# Patient Record
Sex: Male | Born: 1950
Health system: Southern US, Community
[De-identification: ages and names within clinical notes are randomized; demographics above are authoritative.]

## PROBLEM LIST (undated history)

## (undated) DIAGNOSIS — Z932 Ileostomy status: Secondary | ICD-10-CM

## (undated) DIAGNOSIS — Z789 Other specified health status: Secondary | ICD-10-CM

## (undated) DIAGNOSIS — G4733 Obstructive sleep apnea (adult) (pediatric): Secondary | ICD-10-CM

## (undated) DIAGNOSIS — K5190 Ulcerative colitis, unspecified, without complications: Secondary | ICD-10-CM

## (undated) DIAGNOSIS — I10 Essential (primary) hypertension: Secondary | ICD-10-CM

## (undated) DIAGNOSIS — N201 Calculus of ureter: Secondary | ICD-10-CM

## (undated) DIAGNOSIS — E0500 Thyrotoxicosis with diffuse goiter without thyrotoxic crisis or storm: Secondary | ICD-10-CM

## (undated) DIAGNOSIS — N200 Calculus of kidney: Secondary | ICD-10-CM

## (undated) DIAGNOSIS — E782 Mixed hyperlipidemia: Secondary | ICD-10-CM

## (undated) DIAGNOSIS — K219 Gastro-esophageal reflux disease without esophagitis: Secondary | ICD-10-CM

## (undated) DIAGNOSIS — E039 Hypothyroidism, unspecified: Secondary | ICD-10-CM

## (undated) DIAGNOSIS — Z8709 Personal history of other diseases of the respiratory system: Secondary | ICD-10-CM

## (undated) DIAGNOSIS — Z22321 Carrier or suspected carrier of Methicillin susceptible Staphylococcus aureus: Secondary | ICD-10-CM

## (undated) DIAGNOSIS — Z8051 Family history of malignant neoplasm of kidney: Secondary | ICD-10-CM

## (undated) DIAGNOSIS — F419 Anxiety disorder, unspecified: Secondary | ICD-10-CM

## (undated) DIAGNOSIS — Z9989 Dependence on other enabling machines and devices: Secondary | ICD-10-CM

## (undated) DIAGNOSIS — M1710 Unilateral primary osteoarthritis, unspecified knee: Secondary | ICD-10-CM

## (undated) DIAGNOSIS — M179 Osteoarthritis of knee, unspecified: Secondary | ICD-10-CM

## (undated) DIAGNOSIS — R931 Abnormal findings on diagnostic imaging of heart and coronary circulation: Secondary | ICD-10-CM

## (undated) DIAGNOSIS — Z8719 Personal history of other diseases of the digestive system: Secondary | ICD-10-CM

## (undated) DIAGNOSIS — Z85528 Personal history of other malignant neoplasm of kidney: Secondary | ICD-10-CM

## (undated) DIAGNOSIS — Z87442 Personal history of urinary calculi: Secondary | ICD-10-CM

## (undated) DIAGNOSIS — I639 Cerebral infarction, unspecified: Secondary | ICD-10-CM

## (undated) DIAGNOSIS — M1711 Unilateral primary osteoarthritis, right knee: Secondary | ICD-10-CM

## (undated) HISTORY — DX: Essential (primary) hypertension: I10

## (undated) HISTORY — DX: Mixed hyperlipidemia: E78.2

## (undated) HISTORY — DX: Other specified health status: Z78.9

## (undated) HISTORY — DX: Abnormal findings on diagnostic imaging of heart and coronary circulation: R93.1

## (undated) HISTORY — DX: Unilateral primary osteoarthritis, right knee: M17.11

## (undated) HISTORY — DX: Thyrotoxicosis with diffuse goiter without thyrotoxic crisis or storm: E05.00

## (undated) HISTORY — DX: Ulcerative colitis, unspecified, without complications: K51.90

## (undated) HISTORY — DX: Cerebral infarction, unspecified: I63.9

## (undated) HISTORY — DX: Family history of malignant neoplasm of kidney: Z80.51

---

## 1979-04-08 HISTORY — PX: OTHER SURGICAL HISTORY: SHX169

## 1979-04-08 HISTORY — PX: APPENDECTOMY: SHX54

## 1988-12-06 HISTORY — PX: TONSILLECTOMY: SUR1361

## 2001-04-07 HISTORY — PX: PARTIAL NEPHRECTOMY: SHX414

## 2001-11-14 ENCOUNTER — Emergency Department (HOSPITAL_COMMUNITY): Admission: EM | Admit: 2001-11-14 | Discharge: 2001-11-14 | Payer: Self-pay | Admitting: Emergency Medicine

## 2001-11-14 ENCOUNTER — Encounter: Payer: Self-pay | Admitting: Emergency Medicine

## 2001-11-16 ENCOUNTER — Emergency Department (HOSPITAL_COMMUNITY): Admission: EM | Admit: 2001-11-16 | Discharge: 2001-11-16 | Payer: Self-pay | Admitting: Emergency Medicine

## 2001-12-02 ENCOUNTER — Encounter: Payer: Self-pay | Admitting: Urology

## 2001-12-03 ENCOUNTER — Encounter: Payer: Self-pay | Admitting: Urology

## 2001-12-03 ENCOUNTER — Ambulatory Visit (HOSPITAL_COMMUNITY): Admission: RE | Admit: 2001-12-03 | Discharge: 2001-12-03 | Payer: Self-pay | Admitting: Urology

## 2001-12-10 ENCOUNTER — Inpatient Hospital Stay (HOSPITAL_COMMUNITY): Admission: RE | Admit: 2001-12-10 | Discharge: 2001-12-14 | Payer: Self-pay | Admitting: Urology

## 2001-12-10 ENCOUNTER — Encounter (INDEPENDENT_AMBULATORY_CARE_PROVIDER_SITE_OTHER): Payer: Self-pay | Admitting: Specialist

## 2002-04-18 ENCOUNTER — Ambulatory Visit (HOSPITAL_COMMUNITY): Admission: RE | Admit: 2002-04-18 | Discharge: 2002-04-18 | Payer: Self-pay | Admitting: Urology

## 2002-04-18 ENCOUNTER — Encounter: Payer: Self-pay | Admitting: Urology

## 2003-03-01 ENCOUNTER — Ambulatory Visit (HOSPITAL_COMMUNITY): Admission: EM | Admit: 2003-03-01 | Discharge: 2003-03-01 | Payer: Self-pay | Admitting: Emergency Medicine

## 2003-03-08 ENCOUNTER — Inpatient Hospital Stay (HOSPITAL_COMMUNITY): Admission: EM | Admit: 2003-03-08 | Discharge: 2003-03-11 | Payer: Self-pay | Admitting: *Deleted

## 2003-05-08 ENCOUNTER — Ambulatory Visit (HOSPITAL_COMMUNITY): Admission: RE | Admit: 2003-05-08 | Discharge: 2003-05-08 | Payer: Self-pay | Admitting: Urology

## 2004-10-04 ENCOUNTER — Ambulatory Visit (HOSPITAL_COMMUNITY): Admission: RE | Admit: 2004-10-04 | Discharge: 2004-10-04 | Payer: Self-pay | Admitting: Orthopedic Surgery

## 2004-10-04 ENCOUNTER — Ambulatory Visit (HOSPITAL_BASED_OUTPATIENT_CLINIC_OR_DEPARTMENT_OTHER): Admission: RE | Admit: 2004-10-04 | Discharge: 2004-10-04 | Payer: Self-pay | Admitting: Orthopedic Surgery

## 2005-01-01 ENCOUNTER — Ambulatory Visit (HOSPITAL_COMMUNITY): Admission: RE | Admit: 2005-01-01 | Discharge: 2005-01-01 | Payer: Self-pay | Admitting: Urology

## 2006-01-20 ENCOUNTER — Ambulatory Visit (HOSPITAL_COMMUNITY): Admission: RE | Admit: 2006-01-20 | Discharge: 2006-01-20 | Payer: Self-pay | Admitting: Urology

## 2006-08-28 ENCOUNTER — Ambulatory Visit (HOSPITAL_COMMUNITY): Admission: RE | Admit: 2006-08-28 | Discharge: 2006-08-28 | Payer: Self-pay | Admitting: Urology

## 2006-09-23 ENCOUNTER — Ambulatory Visit (HOSPITAL_COMMUNITY): Admission: RE | Admit: 2006-09-23 | Discharge: 2006-09-23 | Payer: Self-pay | Admitting: Urology

## 2006-09-23 HISTORY — PX: OTHER SURGICAL HISTORY: SHX169

## 2006-10-05 ENCOUNTER — Ambulatory Visit (HOSPITAL_COMMUNITY): Admission: RE | Admit: 2006-10-05 | Discharge: 2006-10-05 | Payer: Self-pay | Admitting: Urology

## 2007-03-09 ENCOUNTER — Ambulatory Visit (HOSPITAL_COMMUNITY): Admission: RE | Admit: 2007-03-09 | Discharge: 2007-03-09 | Payer: Self-pay | Admitting: Urology

## 2008-02-01 ENCOUNTER — Emergency Department (HOSPITAL_COMMUNITY): Admission: EM | Admit: 2008-02-01 | Discharge: 2008-02-02 | Payer: Self-pay | Admitting: Emergency Medicine

## 2008-02-03 ENCOUNTER — Emergency Department (HOSPITAL_BASED_OUTPATIENT_CLINIC_OR_DEPARTMENT_OTHER): Admission: EM | Admit: 2008-02-03 | Discharge: 2008-02-03 | Payer: Self-pay | Admitting: Emergency Medicine

## 2008-02-22 ENCOUNTER — Encounter: Admission: RE | Admit: 2008-02-22 | Discharge: 2008-02-22 | Payer: Self-pay | Admitting: Gastroenterology

## 2008-07-30 ENCOUNTER — Emergency Department (HOSPITAL_BASED_OUTPATIENT_CLINIC_OR_DEPARTMENT_OTHER): Admission: EM | Admit: 2008-07-30 | Discharge: 2008-07-30 | Payer: Self-pay | Admitting: Emergency Medicine

## 2009-02-06 ENCOUNTER — Ambulatory Visit (HOSPITAL_COMMUNITY): Admission: RE | Admit: 2009-02-06 | Discharge: 2009-02-06 | Payer: Self-pay | Admitting: Urology

## 2009-02-25 ENCOUNTER — Ambulatory Visit: Payer: Self-pay | Admitting: Diagnostic Radiology

## 2009-02-25 ENCOUNTER — Encounter: Payer: Self-pay | Admitting: Emergency Medicine

## 2009-02-26 ENCOUNTER — Inpatient Hospital Stay (HOSPITAL_COMMUNITY): Admission: EM | Admit: 2009-02-26 | Discharge: 2009-02-26 | Payer: Self-pay | Admitting: Internal Medicine

## 2009-04-12 ENCOUNTER — Encounter: Payer: Self-pay | Admitting: Emergency Medicine

## 2009-04-12 ENCOUNTER — Ambulatory Visit: Payer: Self-pay | Admitting: Diagnostic Radiology

## 2009-04-12 ENCOUNTER — Inpatient Hospital Stay (HOSPITAL_COMMUNITY): Admission: AD | Admit: 2009-04-12 | Discharge: 2009-04-14 | Payer: Self-pay | Admitting: Internal Medicine

## 2010-02-14 ENCOUNTER — Ambulatory Visit (HOSPITAL_COMMUNITY): Admission: RE | Admit: 2010-02-14 | Discharge: 2010-02-14 | Payer: Self-pay | Admitting: Urology

## 2010-04-28 ENCOUNTER — Encounter: Payer: Self-pay | Admitting: Family Medicine

## 2010-06-23 LAB — CBC
HCT: 41.4 % (ref 39.0–52.0)
HCT: 53.2 % — ABNORMAL HIGH (ref 39.0–52.0)
Hemoglobin: 14.5 g/dL (ref 13.0–17.0)
Hemoglobin: 18.4 g/dL — ABNORMAL HIGH (ref 13.0–17.0)
RBC: 4.55 MIL/uL (ref 4.22–5.81)
RBC: 5.86 MIL/uL — ABNORMAL HIGH (ref 4.22–5.81)
RDW: 12.1 % (ref 11.5–15.5)
WBC: 6 10*3/uL (ref 4.0–10.5)

## 2010-06-23 LAB — URINE MICROSCOPIC-ADD ON

## 2010-06-23 LAB — COMPREHENSIVE METABOLIC PANEL
ALT: 25 U/L (ref 0–53)
ALT: 36 U/L (ref 0–53)
Alkaline Phosphatase: 62 U/L (ref 39–117)
Alkaline Phosphatase: 112 U/L (ref 39–117)
BUN: 19 mg/dL (ref 6–23)
BUN: 21 mg/dL (ref 6–23)
CO2: 16 mEq/L — ABNORMAL LOW (ref 19–32)
Chloride: 105 mEq/L (ref 96–112)
GFR calc non Af Amer: 31 mL/min — ABNORMAL LOW (ref >60)
Glucose, Bld: 128 mg/dL — ABNORMAL HIGH (ref 70–99)
Glucose, Bld: 135 mg/dL — ABNORMAL HIGH (ref 70–99)
Potassium: 3.3 mEq/L — ABNORMAL LOW (ref 3.5–5.1)
Potassium: 4.6 mEq/L (ref 3.5–5.1)
Sodium: 139 mEq/L (ref 135–145)
Sodium: 141 mEq/L (ref 135–145)
Total Bilirubin: 0.9 mg/dL (ref 0.3–1.2)
Total Bilirubin: 1.6 mg/dL — ABNORMAL HIGH (ref 0.3–1.2)

## 2010-06-23 LAB — URINALYSIS, ROUTINE W REFLEX MICROSCOPIC
Glucose, UA: NEGATIVE mg/dL
Ketones, ur: 15 mg/dL — AB
Leukocytes, UA: NEGATIVE
Nitrite: NEGATIVE
Protein, ur: 100 mg/dL — AB
pH: 5.5 (ref 5.0–8.0)

## 2010-06-23 LAB — DIFFERENTIAL
Basophils Absolute: 0.1 10*3/uL (ref 0.0–0.1)
Basophils Relative: 1 % (ref 0–1)
Eosinophils Absolute: 0 10*3/uL (ref 0.0–0.7)
Neutro Abs: 5.5 10*3/uL (ref 1.7–7.7)
Neutrophils Relative %: 63 % (ref 43–77)

## 2010-06-23 LAB — LIPASE, BLOOD
Lipase: 23 U/L (ref 11–59)
Lipase: 121 U/L (ref 23–300)

## 2010-07-10 LAB — COMPREHENSIVE METABOLIC PANEL
ALT: 20 U/L (ref 0–53)
AST: 23 U/L (ref 0–37)
Albumin: 3.8 g/dL (ref 3.5–5.2)
Chloride: 103 mEq/L (ref 96–112)
Creatinine, Ser: 1.29 mg/dL (ref 0.4–1.5)
GFR calc Af Amer: >60 mL/min (ref >60)
Sodium: 134 mEq/L — ABNORMAL LOW (ref 135–145)
Total Bilirubin: 1.3 mg/dL — ABNORMAL HIGH (ref 0.3–1.2)

## 2010-07-10 LAB — BASIC METABOLIC PANEL
BUN: 30 mg/dL — ABNORMAL HIGH (ref 6–23)
CO2: 19 mEq/L (ref 19–32)
Chloride: 98 mEq/L (ref 96–112)
GFR calc Af Amer: 38 mL/min — ABNORMAL LOW (ref >60)
Potassium: 4 mEq/L (ref 3.5–5.1)

## 2010-07-10 LAB — CBC
HCT: 51.5 % (ref 39.0–52.0)
MCV: 91.5 fL (ref 78.0–100.0)
MCV: 89.9 fL (ref 78.0–100.0)
Platelets: 278 10*3/uL (ref 150–400)
RBC: 4.74 MIL/uL (ref 4.22–5.81)
RBC: 5.72 MIL/uL (ref 4.22–5.81)
WBC: 4.7 10*3/uL (ref 4.0–10.5)
WBC: 7.1 10*3/uL (ref 4.0–10.5)

## 2010-07-10 LAB — TSH: TSH: 3.188 u[IU]/mL (ref 0.350–4.500)

## 2010-07-10 LAB — URINALYSIS, ROUTINE W REFLEX MICROSCOPIC
Bilirubin Urine: NEGATIVE
Glucose, UA: NEGATIVE mg/dL
Hgb urine dipstick: NEGATIVE
Ketones, ur: NEGATIVE mg/dL
Protein, ur: NEGATIVE mg/dL

## 2010-07-10 LAB — DIFFERENTIAL
Eosinophils Absolute: 0.1 10*3/uL (ref 0.0–0.7)
Eosinophils Relative: 1 % (ref 0–5)
Lymphs Abs: 2 10*3/uL (ref 0.7–4.0)
Monocytes Relative: 22 % — ABNORMAL HIGH (ref 3–12)

## 2010-07-17 LAB — COMPREHENSIVE METABOLIC PANEL
Albumin: 5.6 g/dL — ABNORMAL HIGH (ref 3.5–5.2)
BUN: 27 mg/dL — ABNORMAL HIGH (ref 6–23)
Chloride: 99 mEq/L (ref 96–112)
Creatinine, Ser: 1.7 mg/dL — ABNORMAL HIGH (ref 0.4–1.5)
Total Bilirubin: 2 mg/dL — ABNORMAL HIGH (ref 0.3–1.2)
Total Protein: 10.1 g/dL — ABNORMAL HIGH (ref 6.0–8.3)

## 2010-07-17 LAB — URINALYSIS, ROUTINE W REFLEX MICROSCOPIC
Hgb urine dipstick: NEGATIVE
Nitrite: NEGATIVE
Specific Gravity, Urine: 1.027 (ref 1.005–1.030)
Urobilinogen, UA: 0.2 mg/dL (ref 0.0–1.0)

## 2010-07-17 LAB — CBC
HCT: 51.2 % (ref 39.0–52.0)
MCHC: 34.3 g/dL (ref 30.0–36.0)
MCV: 89.8 fL (ref 78.0–100.0)
Platelets: 319 10*3/uL (ref 150–400)
RDW: 12.2 % (ref 11.5–15.5)

## 2010-08-20 NOTE — Op Note (Signed)
[name], David [name]              ACCOUNT NO.:  [id number]   MEDICAL RECORD NO.:  [id number]          PATIENT TYPE:  AMB   LOCATION:  DAY                          FACILITY:  [facility]   PHYSICIAN:  Corky Downs, M.D.DATE OF BIRTH:  04-25-1950   DATE OF PROCEDURE:  09/23/2006  DATE OF DISCHARGE:                               OPERATIVE REPORT   PREOPERATIVE DIAGNOSIS:  Right mid ureteral stone 6 mm.   POSTOPERATIVE DIAGNOSIS:  Right mid ureteral stone 6 mm.   OPERATION:  Cystoscopy, retrograde pyelogram, insertion of right  ureteral stent.   ANESTHESIA:  General.   SURGEON:  Corky Downs, M.D.   BRIEF HISTORY:  This 60 year old patient admitted with an obstructing  stone that presented earlier today.  He has an ileostomy after having a  total colectomy for Crohn disease many years ago.  He has had recurrent  stones.  He has had to have ureteroscopy by Dr. Rosana Hoes in the past, was  seen in the office on 08/28/2006, had a 6 mm right mid renal stone on CT  scan and has had a partial left nephrectomy for renal cancer in the  past.  The KUB shows the stone looks like it is in the junction of the  middle to lower third of the ureter on KUB and he enters now for cysto,  stent insertion to relieve his pain and nausea and vomiting.   The patient was placed on the operating table in dorsal lithotomy  position after satisfactory induction of general anesthesia.  He had  been given IV fluids and pain medicine until we could get him scheduled  to come to the operating room.  He was given some IV Cipro and the #21  panendoscope was inserted.  No anterior strictures seen.  Post urethra  was nonobstructing.  The bladder was entered.  Bladder was smooth not  trabeculated.  Had no bladder mucosal lesions.  Both orifices looked  normal.  Using a 6 open-ended ureteral catheter, an occlusive retrograde  was performed on the right side.   The retrograde demonstrated a stone in the mid  ureter that was flushed  more proximal with the dye but was clearly seen.  There was mild to  moderate hydronephrosis.  Under fluoroscopy a guidewire was then passed  beyond the stone up to the level of the kidney as the open-ended  catheter was then removed. Over the guidewire a 4.5 x 24 cm length  double-J ureteral stent was fashioned with a nice coil in the renal  pelvis and one in the bladder when the guidewire was then removed.  The  stone was still opposite the transverse process of L3 on the right in  good position for lithotripsy.  We will set this up for most likely June  30 and they have a family vacation coming up next week.  Sent to  recovery room in good condition to be later discharged as an outpatient  with detailed written instructions.      Corky Downs, M.D.  Electronically Signed    HMK/MEDQ  D:  09/23/2006  T:  09/24/2006  Job:  [id number]

## 2010-08-23 NOTE — Discharge Summary (Signed)
NAME:  David [name]                        ACCOUNT NO.:  [id number]   MEDICAL RECORD NO.:  [id number]                   PATIENT TYPE:  INP   LOCATION:  8270                                 FACILITY:  [facility]   PHYSICIAN:  Jerelene Redden, MD                   DATE OF BIRTH:  02-Dec-1950   DATE OF ADMISSION:  03/08/2003  DATE OF DISCHARGE:  03/11/2003                                 DISCHARGE SUMMARY   David [name] is a 60 year old man who reports that he was discharged from  the hospital on March 01, 2003, after removal of a kidney stone.  Saturday night after eating, he felt uneasy, as if his stomach was full, and  he had a difficult time sleeping.  The next day he began experiencing  vomiting.  On Monday, the vomiting became more severe but he chose to go to  [place] with his family on a planned vacation.  Vomiting persisted and he was  seen in the emergency room in [place] on Tuesday.  He was given two liters of  fluid IV but became increasingly uneasy with his treatment, and arranged for  transportation back to [place].  The last episode of vomiting was at 6:00  p.m. the day of admission, which was March 08, 2003.   On physical exam at the time of admission he was a somewhat nauseated  cooperative man who was not experiencing any pain.  HEENT exam was within  normal limits.  The chest was clear.  Examination of the back revealed no  CVA or point tenderness.  Cardiovascular exam revealed normal S1 and S2  without rubs, murmurs or gallops.  The abdomen was remarkable for good bowel  sounds which were not high-pitched, there was no guarding, no rebound, no  masses.  The ileostomy bag contained some brown fluid.  The patient does not  have a rectum as he is status post colectomy.  Neurologic testing and his  extremities is normal.   Relevant laboratory studies included sodium 129, potassium 3.6, creatinine  was 1.8, BUN was 34.  The AST was 58, ALT was 88.  Subsequently these  values  were followed with IV hydration and the sodium came up to 137, creatinine  went down to 1.2, and liver functions normalized.  The white count was  7,800, hemoglobin was 17.3.  X-ray studies showed moderate dilatation of the  small bowel with air fluid levels consistent with a small bowel obstruction.   On admission the patient placed on IV of normal saline, he was given two  liters initially, and then was given IV at the rate of 250 mL per hour with  10 mEq of KCl per liter.  Nausea medications including Phenergan and Zofran  were ordered.  The patient was seen in consultation by Dr. Cristina Gong of the GI  service.  A small bowel follow-through was done.  Review of this study  showed that there was good movement of barium through the small bowel with  no evidence of mechanical obstruction.  By March 11, 2003, the patient was  feeling entirely well, he had a good appetite, he said that his ileostomy  function was almost back to normal.  He very strongly wished to be  discharged from the hospital.  His diet was advanced to a bland soft diet  and plans were made to discharge him at this time.  On March 11, 2003, the  patient was discharged.   DISCHARGE DIAGNOSES:  1. Transient small bowel obstruction resolved with conservative therapy.  2. Vomiting and dehydration secondary to #1.  3. History of ulcerative colitis status post colectomy.  4. History of kidney stones.  5. History of renal cell cancer status post partial nephrectomy.  6. Hypothyroidism.   DISCHARGE MEDICATIONS:  1. Synthroid 175 mcg daily.  2. Paxil 20 mg daily.  3. The patient will also be given a five-day course of oral Cipro to     continue the Cipro that was started in the hospital with the possible     consideration of having acquired a empiric pathogen in [place].  4. He will also use Imodium on a p.r.n. basis.   He was encouraged to follow up with the [facility] and normally sees  Dr. Amedeo Plenty.  He was  also urged to follow up with [facility] on a p.r.n. basis.  Condition at the time of discharge was good.                                               Jerelene Redden, MD    SY/MEDQ  D:  03/11/2003  T:  03/11/2003  Job:  [id number]   cc:   Ronald Lobo, M.D.  [address] [place]  [place], [place] [address]  Fax: [phone]   [place] Parks Neptune, M.D.  [place], [address] [place]   [address]  Fax: [phone]

## 2010-08-23 NOTE — H&P (Signed)
NAME:  David [name], David [name]                        ACCOUNT NO.:  [id number]   MEDICAL RECORD NO.:  [id number]                   PATIENT TYPE:  OUT   LOCATION:  XRAY                                 FACILITY:  [facility]   PHYSICIAN:  Duane Lope. Parks Neptune, M.D.           DATE OF BIRTH:  May 07, 1950   DATE OF ADMISSION:  12/10/2001  DATE OF DISCHARGE:  12/03/2001                                HISTORY & PHYSICAL   CHIEF COMPLAINT:  I have cancer.   HISTORY OF PRESENT ILLNESS:  The patient is a very nice 60 year old white  male who presented after a CT scan for kidney stones revealed a 1.7 cm left  renal mass.  He subsequently passed the kidney stone and on follow-up CT  scan did not have any more ureteral calculi, however, on selected CT scan of  the kidney with and without contrast it was felt to have some advancement  and was suspicious for a hypovascular left renal cell carcinoma.  There is  also some question of bone activity so a bone scan was essentially negative  and also on CT of the chest there was a 5 mm pleural based nodule on the  right side upper lobe which was very suspicious for a granuloma and was felt  to be most likely benign.  It was not in an area that could be biopsied and  the plan was to follow up because of the very low likelihood of it being a  metastatic deposit.  After understanding risks, benefits, and alternatives  he has elected to proceed with flank exploration, partial nephrectomy,  possible radical nephrectomy.  We discussed laparoscopic techniques and  also, of note, he has had a prior colectomy.   ALLERGIES:  No known drug allergies.   MEDICATIONS:  Synthroid and Paxil.   PAST MEDICAL HISTORY:  He had chronic ulcerative colitis that has been fully  treated now.  Had childhood asthma.  Otherwise, essentially negative.   PAST SURGICAL HISTORY:  He had an appendectomy years ago and a colectomy and  ileostomy in 1981.   SOCIAL HISTORY:  Negative smoker.   Negative drinker.   FAMILY HISTORY:  Nonsignificant.   REVIEW OF SYMPTOMS:  He has no shortness of breath, dyspnea on exertion,  chest pain, or GI complaints and his colostomy is functional.   PHYSICAL EXAMINATION:  VITAL SIGNS:  Temperature 97.5 degrees Fahrenheit,  pulse 67, respirations 12-15, blood pressure 110/70.  GENERAL:  Well-nourished, well-developed, well groomed.  HEENT:  PERRLA.  Ear, nose, and throat clear.  NECK:  Without mass or thyromegaly.  CHEST:  Clear anterior and posterior without rales or rhonchi.  ABDOMEN:  Soft, nontender without masses, organomegaly, or hernias.  Wounds  are well healed.  EXTREMITIES:  Normal.  NEUROLOGIC:  Intact.  SKIN:  Normal.  GENITALIA:  Penis, meatus, scrotum, testicle, adnexa, anus, and perineum are  normal.   IMPRESSION:  Left renal mass.  PLAN:  Left flank exploration with possible partial nephrectomy and possible  radical nephrectomy.                                               [place] Parks Neptune, M.D.    RLD/MEDQ  D:  12/10/2001  T:  12/10/2001  Job:  [id number]

## 2010-08-23 NOTE — Consult Note (Signed)
NAME:  David [name], David [name]                        ACCOUNT NO.:  [id number]   MEDICAL RECORD NO.:  [id number]                   PATIENT TYPE:  INP   LOCATION:  7124                                 [facility]   PHYSICIAN:  Jeryl Columbia, M.D.                 DATE OF BIRTH:  Apr 07, 1951   DATE OF CONSULTATION:  03/09/2003  DATE OF DISCHARGE:                                   CONSULTATION   HISTORY:  The patient with a five day history of nausea, vomiting, and  change in ileostomy output with some decreased output.  The patient is seen  at the request of Ashby Dawes. Polite, M.D. for the above symptoms.  He has had  partial bowel obstructions in the past, but usually can tell when they  resolve.  This one has lasted longer than most.  He has had these off and on  ever since his ileostomy.  Does say that eating popcorn and carrots or  certain foods like that do tend to bring them on.  He was in [place] when  this occurred, but had no sick contacts.  Did go to the [place] ER but no  laboratory work was done.  Was started on Cipro and Flagyl.  Only took  Cipro, but was also given some Imodium and did take some of that which may  have contributed to worsening his problem.  He has not seen any blood and  feels better this morning and says his ileostomy output has increased.   PAST MEDICAL HISTORY:  1. Thyroid problems.  2. Ileostomy secondary to ulcerative colitis 25 years ago.  3. Had a recent cystoscopy for kidney stones.  4. Left partial nephrectomy for kidney cancer.   FAMILY HISTORY:  Negative for any obvious GI problems.   SOCIAL HISTORY:  Denies alcohol or tobacco use.   MEDICATIONS:  1. Levoxyl.  2. Paxil.  3. Cipro.  4. Imodium.   REVIEW OF SYSTEMS:  Negative except above.   PHYSICAL EXAMINATION:  GENERAL:  No acute distress.  No abdominal pain.  Decreased, but present bowel sounds.  VITAL SIGNS:  Stable.  Afebrile.   LABORATORIES:  He did have an increased BUN and  creatinine, better with  hydration, slight increase in liver tests.  Last night KUB looks like small  bowel obstruction, probably a little less air today, but no colon available  to see if there is any colon gas.  Definitely not a __________.   ASSESSMENT:  Probable partial small bowel obstruction, although patient  feeling better.  Does have history of kidney stones, kidney cancer,  ulcerative colitis with ileostomy.   PLAN:  Because of his lack of colon interpreting the x-rays can be  difficult.  It is difficult to know what his baseline x-ray is.  He denies  any obvious abdominal distention.  I would go ahead and allow clear liquids  and will observe  and follow with you.  Consider an upper GI small bowel  follow through.  If we thought he was still obstructed use Gastrografin.  If  we thought he was even better might even use barium to get a better study.  However, if he was worse, might consider a Gastrografin via his ileostomy to  evaluate from below.  Might even consider surgical consult.  Will follow  with you.                                               Jeryl Columbia, M.D.    MEM/MEDQ  D:  03/09/2003  T:  03/09/2003  Job:  [id number]   cc:   [place] Amedeo Plenty, M.D.  [address], [place]  [place] [address]  Fax: [place] Parks Neptune, M.D.  [place]. [address], [address] [place]  [place] [address]  Fax: [phone]

## 2010-09-11 ENCOUNTER — Emergency Department (HOSPITAL_COMMUNITY): Payer: BC Managed Care – PPO

## 2010-09-11 ENCOUNTER — Emergency Department (HOSPITAL_COMMUNITY)
Admission: EM | Admit: 2010-09-11 | Discharge: 2010-09-12 | Disposition: A | Discharge status: Home / Self Care | Payer: BC Managed Care – PPO | Attending: Emergency Medicine | Admitting: Emergency Medicine

## 2010-09-11 DIAGNOSIS — R300 Dysuria: Secondary | ICD-10-CM | POA: Insufficient documentation

## 2010-09-11 DIAGNOSIS — E039 Hypothyroidism, unspecified: Secondary | ICD-10-CM | POA: Insufficient documentation

## 2010-09-11 DIAGNOSIS — Z87442 Personal history of urinary calculi: Secondary | ICD-10-CM | POA: Insufficient documentation

## 2010-09-11 DIAGNOSIS — Z85528 Personal history of other malignant neoplasm of kidney: Secondary | ICD-10-CM | POA: Insufficient documentation

## 2010-09-11 DIAGNOSIS — F411 Generalized anxiety disorder: Secondary | ICD-10-CM | POA: Insufficient documentation

## 2010-09-11 DIAGNOSIS — N23 Unspecified renal colic: Secondary | ICD-10-CM | POA: Insufficient documentation

## 2010-09-11 DIAGNOSIS — R109 Unspecified abdominal pain: Secondary | ICD-10-CM | POA: Insufficient documentation

## 2010-09-11 LAB — URINALYSIS, ROUTINE W REFLEX MICROSCOPIC
Glucose, UA: NEGATIVE mg/dL
Nitrite: NEGATIVE
Protein, ur: NEGATIVE mg/dL
Urobilinogen, UA: 0.2 mg/dL (ref 0.0–1.0)

## 2010-09-11 LAB — POCT I-STAT, CHEM 8
HCT: 44 % (ref 39.0–52.0)
Hemoglobin: 15 g/dL (ref 13.0–17.0)
Sodium: 142 mEq/L (ref 135–145)
TCO2: 26 mmol/L (ref 0–100)

## 2011-01-06 LAB — CBC
HCT: 47.4
Platelets: 298
WBC: 13.8 — ABNORMAL HIGH

## 2011-01-06 LAB — DIFFERENTIAL
Eosinophils Relative: 1
Lymphocytes Relative: 9 — ABNORMAL LOW
Lymphs Abs: 1.2
Neutro Abs: 11.3 — ABNORMAL HIGH

## 2011-01-07 LAB — URINALYSIS, ROUTINE W REFLEX MICROSCOPIC
Glucose, UA: NEGATIVE
Ketones, ur: 15 — AB
Nitrite: NEGATIVE
Specific Gravity, Urine: 1.029
pH: 5

## 2011-01-07 LAB — CBC
MCHC: 34.8
MCV: 88.5
Platelets: 305
RDW: 11.8

## 2011-01-07 LAB — DIFFERENTIAL
Eosinophils Absolute: 0.1
Eosinophils Relative: 1
Lymphocytes Relative: 28
Lymphs Abs: 2
Monocytes Relative: 17 — ABNORMAL HIGH

## 2011-01-07 LAB — URINE MICROSCOPIC-ADD ON

## 2011-01-07 LAB — COMPREHENSIVE METABOLIC PANEL
ALT: 26
AST: 31
Calcium: 10.3
Creatinine, Ser: 1.8 — ABNORMAL HIGH
GFR calc Af Amer: 48 — ABNORMAL LOW
GFR calc non Af Amer: 39 — ABNORMAL LOW
Sodium: 141
Total Protein: 8.5 — ABNORMAL HIGH

## 2011-01-09 IMAGING — CR DG CHEST 2V
2 series · 2 of 2 positions shown · non-contrast
Comparison: 03/09/2007

CLINICAL DATA: Renal cell cancer

CHEST - 2 VIEW

[w chest pa]
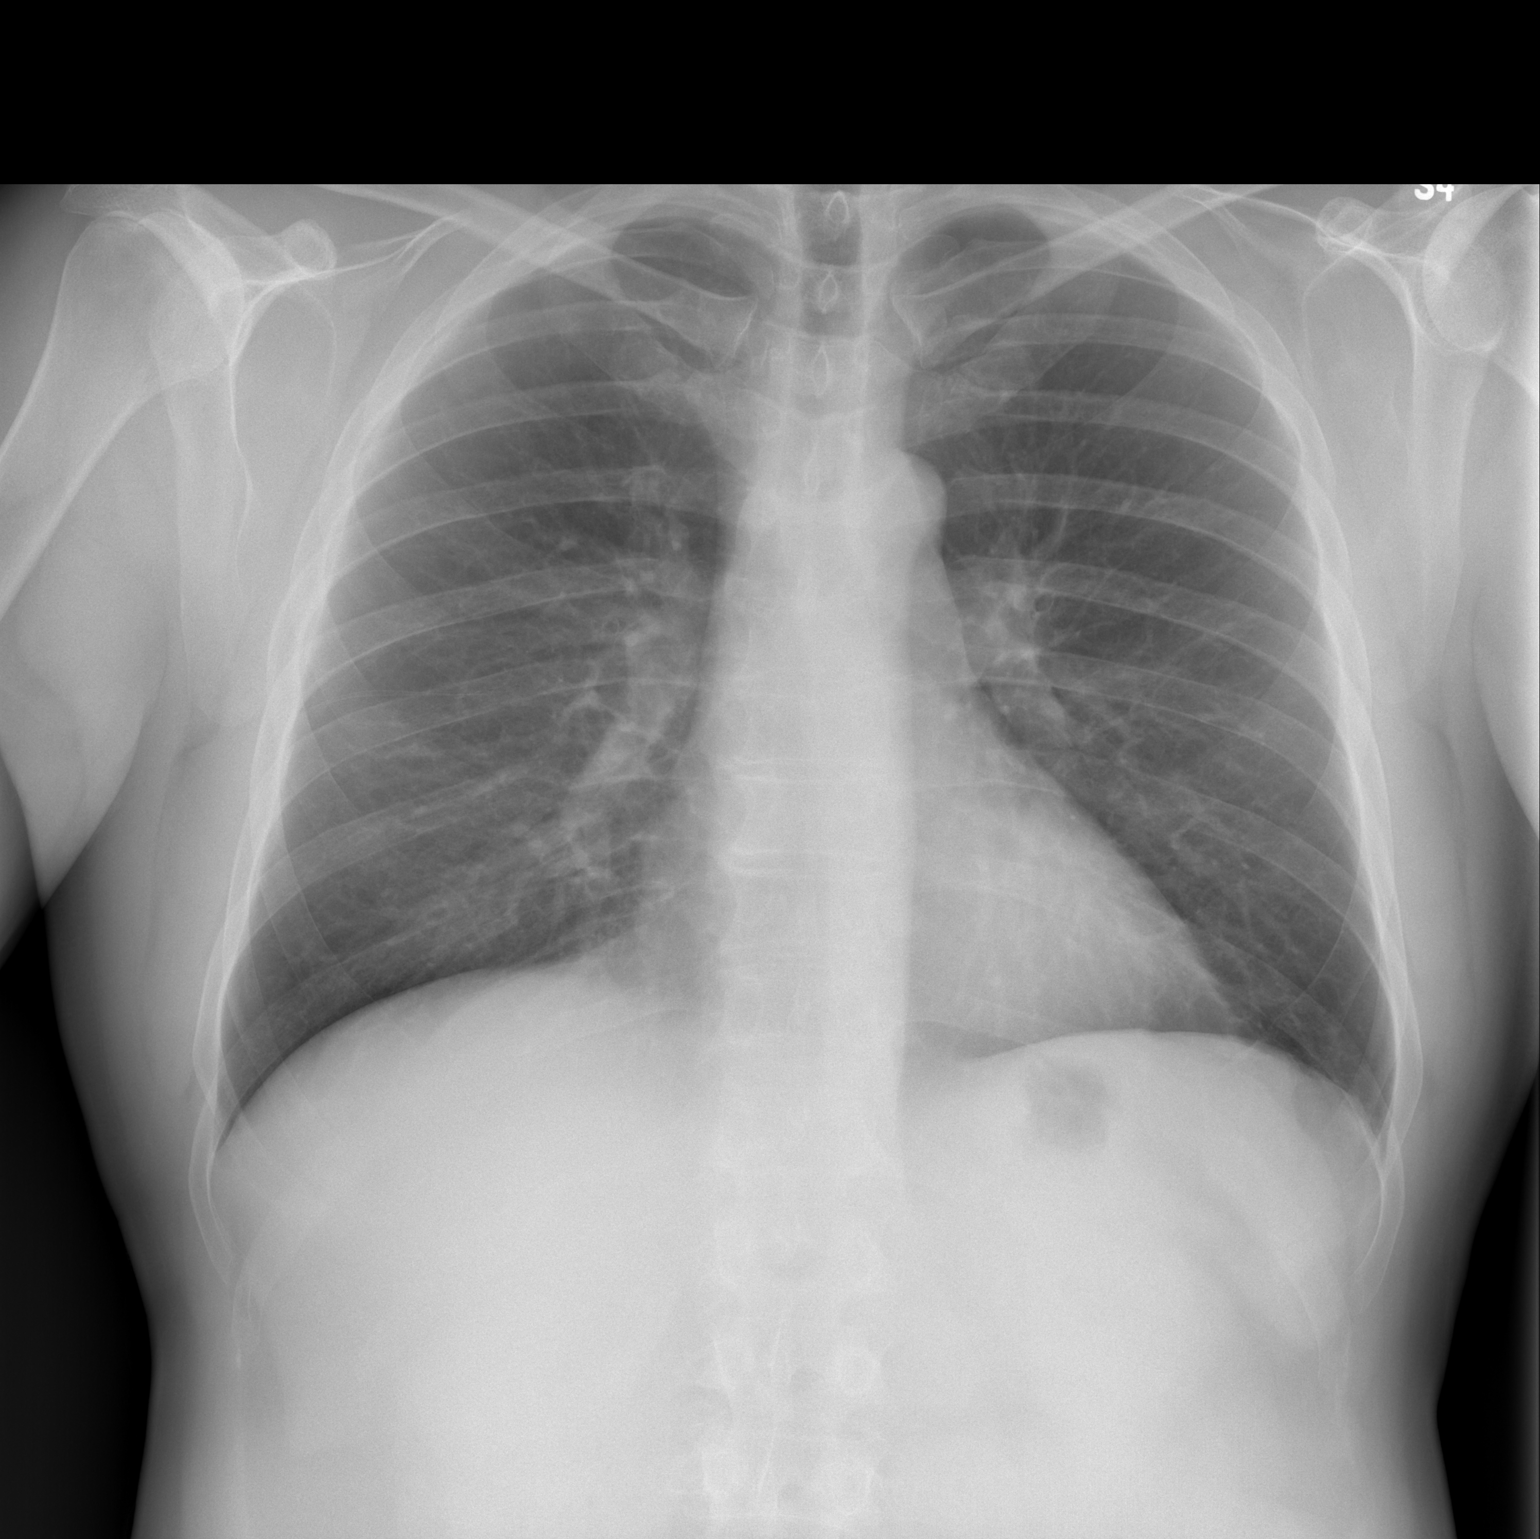

[w chest lat]
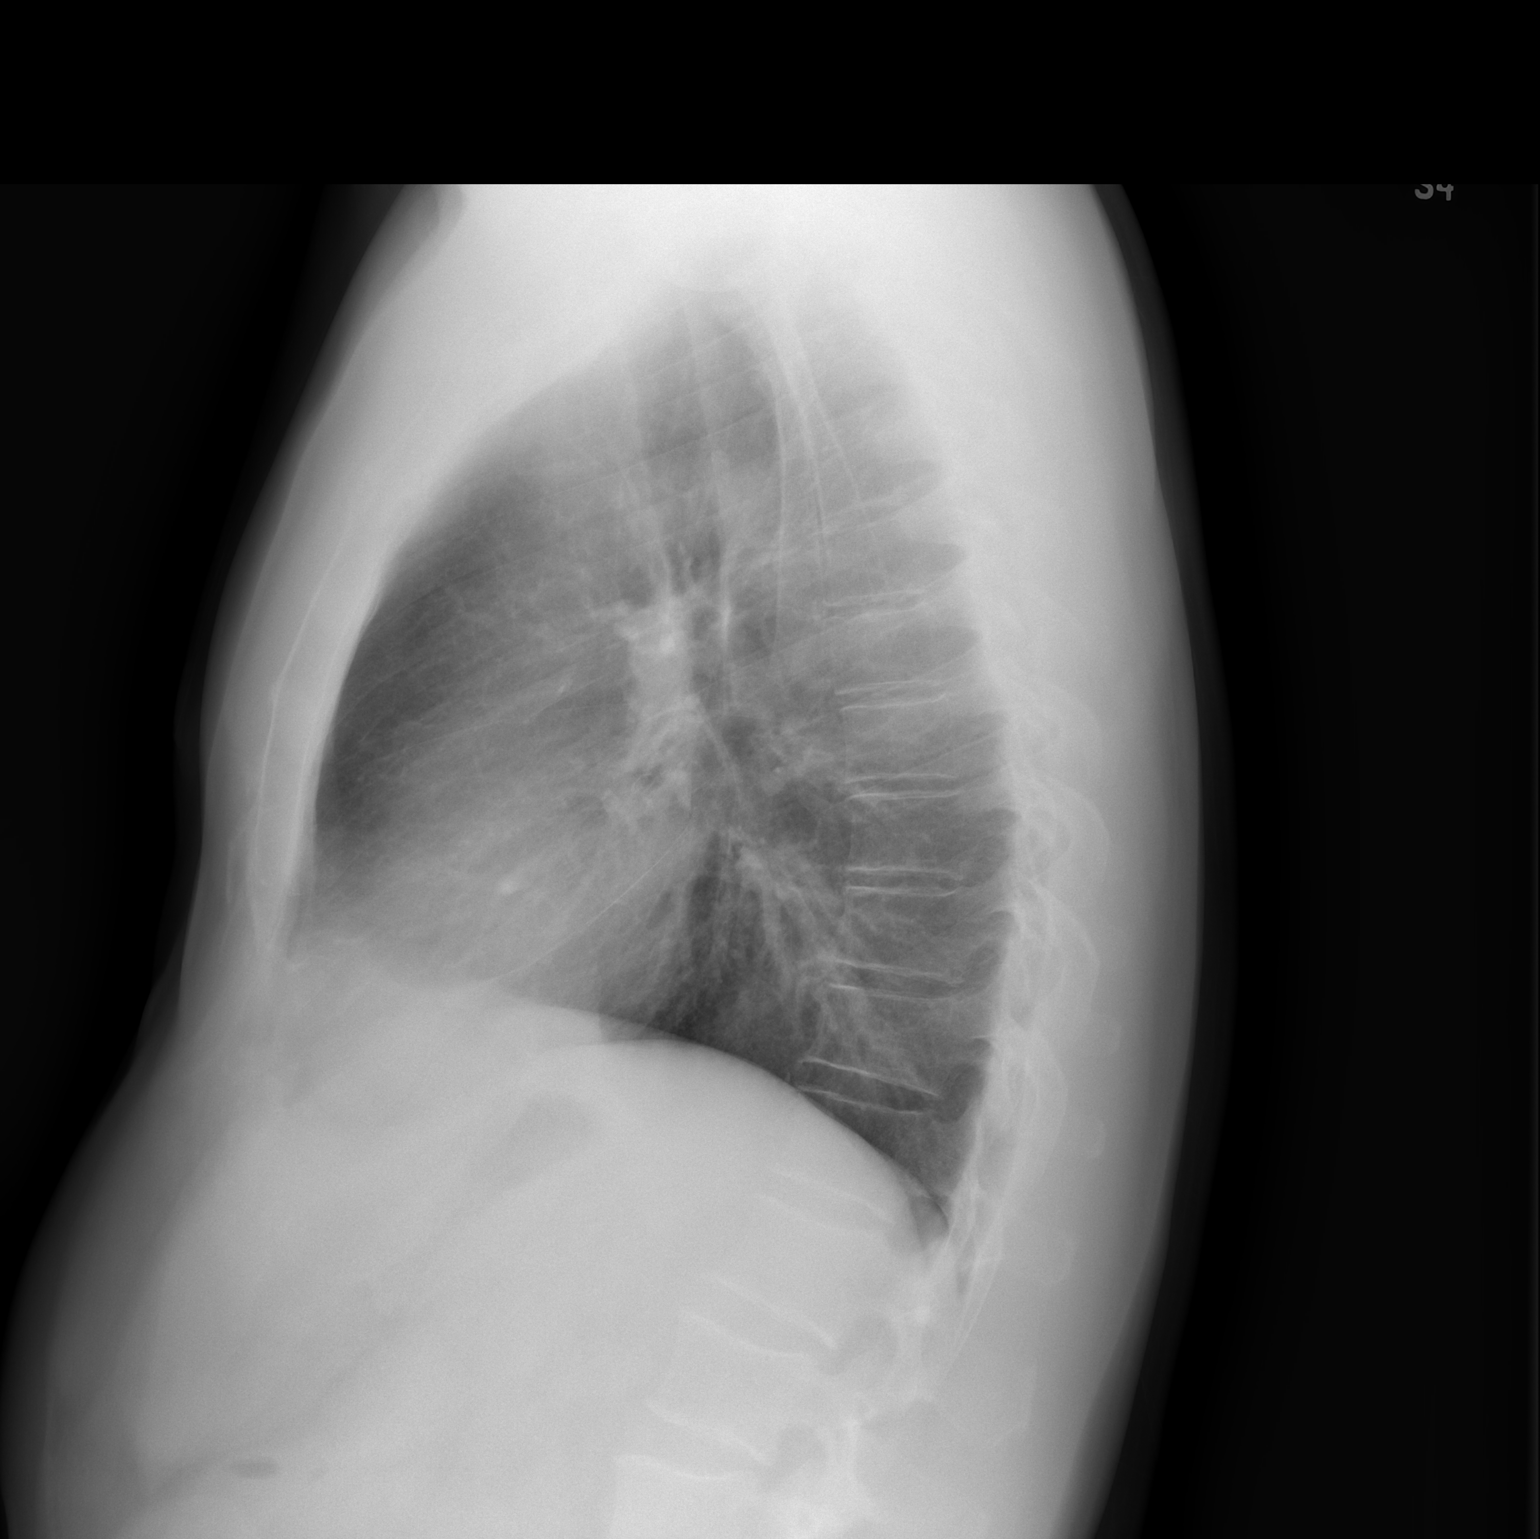

[2 of 2 positions shown; findings below may reference images not displayed]

FINDINGS: The heart is normal in size.  Lungs are clear.  No
pneumothorax.  No pleural effusion.
IMPRESSION: No active cardiopulmonary disease.

## 2011-01-22 LAB — URINALYSIS, ROUTINE W REFLEX MICROSCOPIC
Bilirubin Urine: NEGATIVE
Glucose, UA: NEGATIVE
Ketones, ur: NEGATIVE
Protein, ur: 100 — AB
Urobilinogen, UA: 0.2

## 2011-01-22 LAB — BASIC METABOLIC PANEL
BUN: 21
CO2: 21
Calcium: 9.8
Chloride: 105
Creatinine, Ser: 1.64 — ABNORMAL HIGH
GFR calc Af Amer: 53 — ABNORMAL LOW

## 2011-01-22 LAB — CBC
HCT: 40.7
HCT: 48.5
Hemoglobin: 14.2
MCHC: 34.6
MCV: 87.6
MCV: 87.7
Platelets: 364
RBC: 4.65
RBC: 5.53
WBC: 16.6 — ABNORMAL HIGH
WBC: 6.5

## 2011-01-22 LAB — URINE MICROSCOPIC-ADD ON

## 2011-02-13 ENCOUNTER — Other Ambulatory Visit (HOSPITAL_COMMUNITY): Payer: Self-pay | Admitting: Urology

## 2011-02-13 ENCOUNTER — Ambulatory Visit (HOSPITAL_COMMUNITY)
Admission: RE | Admit: 2011-02-13 | Discharge: 2011-02-13 | Disposition: A | Discharge status: Home / Self Care | Payer: BC Managed Care – PPO | Source: Ambulatory Visit | Attending: Urology | Admitting: Urology

## 2011-02-13 DIAGNOSIS — C649 Malignant neoplasm of unspecified kidney, except renal pelvis: Secondary | ICD-10-CM | POA: Insufficient documentation

## 2011-02-13 DIAGNOSIS — Z125 Encounter for screening for malignant neoplasm of prostate: Secondary | ICD-10-CM

## 2011-09-26 ENCOUNTER — Emergency Department (HOSPITAL_BASED_OUTPATIENT_CLINIC_OR_DEPARTMENT_OTHER): Payer: BC Managed Care – PPO

## 2011-09-26 ENCOUNTER — Encounter (HOSPITAL_BASED_OUTPATIENT_CLINIC_OR_DEPARTMENT_OTHER): Payer: Self-pay | Admitting: *Deleted

## 2011-09-26 ENCOUNTER — Emergency Department (HOSPITAL_BASED_OUTPATIENT_CLINIC_OR_DEPARTMENT_OTHER)
Admission: EM | Admit: 2011-09-26 | Discharge: 2011-09-27 | Disposition: A | Discharge status: Home / Self Care | Payer: BC Managed Care – PPO | Attending: Emergency Medicine | Admitting: Emergency Medicine

## 2011-09-26 DIAGNOSIS — W11XXXA Fall on and from ladder, initial encounter: Secondary | ICD-10-CM | POA: Insufficient documentation

## 2011-09-26 DIAGNOSIS — S2249XA Multiple fractures of ribs, unspecified side, initial encounter for closed fracture: Secondary | ICD-10-CM | POA: Insufficient documentation

## 2011-09-26 DIAGNOSIS — E079 Disorder of thyroid, unspecified: Secondary | ICD-10-CM | POA: Insufficient documentation

## 2011-09-26 LAB — URINALYSIS, ROUTINE W REFLEX MICROSCOPIC
Ketones, ur: NEGATIVE mg/dL
Leukocytes, UA: NEGATIVE
Protein, ur: NEGATIVE mg/dL
Urobilinogen, UA: 0.2 mg/dL (ref 0.0–1.0)

## 2011-09-26 MED ORDER — OXYCODONE-ACETAMINOPHEN 5-325 MG PO TABS: 2.0000 | ORAL_TABLET | ORAL | Status: AC | PRN

## 2011-09-26 MED ORDER — OXYCODONE-ACETAMINOPHEN 5-325 MG PO TABS
2.0000 | ORAL_TABLET | ORAL | Status: DC | PRN
  Administered 2011-09-26: 2 via ORAL
  Filled 2011-09-26: qty 2

## 2011-09-26 NOTE — ED Notes (Signed)
Fell 10 -12 feet off a ladder. Injury to his left ribs and back. Thinks he has internal bleeding.

## 2011-09-26 NOTE — Discharge Instructions (Signed)
Rib Fracture Your caregiver has diagnosed you as having a rib fracture (a break). This can occur by a blow to the chest, by a fall against a hard object, or by violent coughing or sneezing. There may be one or many breaks. Rib fractures may heal on their own within 3 to 8 weeks. The longer healing period is usually associated with a continued cough or other aggravating activities. HOME CARE INSTRUCTIONS   Avoid strenuous activity. Be careful during activities and avoid bumping the injured rib. Activities that cause pain pull on the fracture site(s) and are best avoided if possible.   Eat a normal, well-balanced diet. Drink plenty of fluids to avoid constipation.   Take deep breaths several times a day to keep lungs free of infection. Try to cough several times a day, splinting the injured area with a pillow. This will help prevent pneumonia.   Do not wear a rib belt or binder. These restrict breathing which can lead to pneumonia.   Only take over-the-counter or prescription medicines for pain, discomfort, or fever as directed by your caregiver.  SEEK MEDICAL CARE IF:  You develop a continual cough, associated with thick or bloody sputum. SEEK IMMEDIATE MEDICAL CARE IF:   You have a fever or cough up blood.   You have difficulty breathing.   You have nausea (feeling sick to your stomach), vomiting, or abdominal (belly) pain.   You have worsening pain, not controlled with medications.   You have dizziness or faint or other concerns. Document Released: 03/24/2005 Document Revised: 03/13/2011 Document Reviewed: 08/26/2006 [facility] Patient Information 2012 [place], [place].

## 2011-09-26 NOTE — ED Provider Notes (Signed)
History     CSN: [id number]  Arrival date & time 09/26/11  2115   First MD Initiated Contact with Patient 09/26/11 2228      Chief Complaint  Patient presents with  . Fall    (Consider location/radiation/quality/duration/timing/severity/associated sxs/prior treatment) HPI This 61 year old male fell off a ladder about 10 feet and landed on the ground and is but, he states the ground is a self-limiting and it did not bother him but as he fell in his torso and then struck a small tree causing left posterior back rib cage pain to the left of the midline. He has no amnesia no lightheadedness no loss of consciousness no headache and no amnesia. He is no neck pain or midline back pain. He is not short of breath. He has no anterior chest pain. He is no abdominal pain. He voided with clear urine without gross hematuria. He has no weakness or numbness to his arms or legs. He is able to walk. His pain is well localized without radiation or associated symptoms. He is not coughing up blood. Past Medical History  Diagnosis Date  . Cancer   . Renal disorder   . Thyroid disease     Past Surgical History  Procedure Date  . Appendectomy   . Tonsillectomy     No family history on file.  History  Substance Use Topics  . Smoking status: Never Smoker   . Smokeless tobacco: Not on file  . Alcohol Use: No      Review of Systems  Constitutional: Negative for fever.       10 Systems reviewed and are negative for acute change except as noted in the HPI.  HENT: Negative for congestion.   Eyes: Negative for discharge and redness.  Respiratory: Negative for cough and shortness of breath.   Cardiovascular: Positive for chest pain.  Gastrointestinal: Negative for vomiting and abdominal pain.  Genitourinary: Positive for flank pain.  Musculoskeletal: Positive for back pain.  Skin: Negative for rash.  Neurological: Negative for syncope, weakness, light-headedness, numbness and headaches.    Psychiatric/Behavioral:       No behavior change.    Allergies  Review of patient's allergies indicates no known allergies.  Home Medications   Current Outpatient Rx  Name Route Sig Dispense Refill  . DULOXETINE HCL 20 MG PO CPEP Oral Take 20 mg by mouth daily.    . IBUPROFEN 200 MG PO TABS Oral Take 400 mg by mouth every 6 (six) hours as needed. Patient used this medication for pain.    . OXYCODONE-ACETAMINOPHEN 5-325 MG PO TABS Oral Take 2 tablets by mouth every 4 (four) hours as needed for pain. 20 tablet 0    BP 160/100  Pulse 84  Temp 97.8 F (36.6 C) (Oral)  Resp 26  SpO2 100%  Physical Exam  Nursing note and vitals reviewed. Constitutional:       Awake, alert, nontoxic appearance.  HENT:  Head: Atraumatic.  Eyes: Right eye exhibits no discharge. Left eye exhibits no discharge.  Neck: Neck supple.  Cardiovascular: Normal rate and regular rhythm.   No murmur heard. Pulmonary/Chest: Effort normal and breath sounds normal. No respiratory distress. He has no wheezes. He has no rales. He exhibits tenderness.       Left parathoracic posterior flank tenderness without flail chest appreciated and no subcutaneous emphysema  Abdominal: Soft. He exhibits no mass. There is no tenderness. There is no rebound and no guarding.  Musculoskeletal: He exhibits no tenderness.  Baseline ROM, no obvious new focal weakness. No cervical spine tenderness, no neck tenderness, no midline back tenderness, no lumbar or paralumbar tenderness.  Neurological: He is alert.       Mental status and motor strength appears baseline for patient and situation.  Skin: No rash noted.  Psychiatric: He has a normal mood and affect.    ED Course  Procedures (including critical care time)   Labs Reviewed  URINALYSIS, ROUTINE W REFLEX MICROSCOPIC   No results found.   1. Multiple rib fractures       MDM  Pt stable in ED with no significant deterioration in condition.Patient / Family /  Caregiver informed of clinical course, understand medical decision-making process, and agree with plan.        Babette Relic, MD 10/02/11 [phone]

## 2012-07-14 ENCOUNTER — Other Ambulatory Visit (HOSPITAL_COMMUNITY): Payer: Self-pay | Admitting: Urology

## 2012-07-14 ENCOUNTER — Ambulatory Visit (HOSPITAL_COMMUNITY)
Admission: RE | Admit: 2012-07-14 | Discharge: 2012-07-14 | Disposition: A | Discharge status: Home / Self Care | Payer: BC Managed Care – PPO | Source: Ambulatory Visit | Attending: Urology | Admitting: Urology

## 2012-07-14 DIAGNOSIS — Z8546 Personal history of malignant neoplasm of prostate: Secondary | ICD-10-CM | POA: Insufficient documentation

## 2012-07-14 DIAGNOSIS — Z85528 Personal history of other malignant neoplasm of kidney: Secondary | ICD-10-CM

## 2012-07-14 DIAGNOSIS — Z8042 Family history of malignant neoplasm of prostate: Secondary | ICD-10-CM

## 2012-07-14 DIAGNOSIS — I517 Cardiomegaly: Secondary | ICD-10-CM | POA: Insufficient documentation

## 2012-10-26 ENCOUNTER — Emergency Department (HOSPITAL_BASED_OUTPATIENT_CLINIC_OR_DEPARTMENT_OTHER)
Admission: EM | Admit: 2012-10-26 | Discharge: 2012-10-27 | Disposition: A | Discharge status: Home / Self Care | Payer: BC Managed Care – PPO | Source: Home / Self Care | Attending: Emergency Medicine | Admitting: Emergency Medicine

## 2012-10-26 ENCOUNTER — Encounter (HOSPITAL_BASED_OUTPATIENT_CLINIC_OR_DEPARTMENT_OTHER): Payer: Self-pay | Admitting: *Deleted

## 2012-10-26 DIAGNOSIS — D649 Anemia, unspecified: Secondary | ICD-10-CM | POA: Insufficient documentation

## 2012-10-26 DIAGNOSIS — E860 Dehydration: Secondary | ICD-10-CM | POA: Insufficient documentation

## 2012-10-26 DIAGNOSIS — Z9049 Acquired absence of other specified parts of digestive tract: Secondary | ICD-10-CM | POA: Insufficient documentation

## 2012-10-26 DIAGNOSIS — Z85528 Personal history of other malignant neoplasm of kidney: Secondary | ICD-10-CM | POA: Insufficient documentation

## 2012-10-26 DIAGNOSIS — E876 Hypokalemia: Secondary | ICD-10-CM | POA: Insufficient documentation

## 2012-10-26 DIAGNOSIS — K529 Noninfective gastroenteritis and colitis, unspecified: Secondary | ICD-10-CM

## 2012-10-26 DIAGNOSIS — D72829 Elevated white blood cell count, unspecified: Secondary | ICD-10-CM | POA: Insufficient documentation

## 2012-10-26 DIAGNOSIS — A088 Other specified intestinal infections: Principal | ICD-10-CM | POA: Insufficient documentation

## 2012-10-26 DIAGNOSIS — Z905 Acquired absence of kidney: Secondary | ICD-10-CM | POA: Insufficient documentation

## 2012-10-26 DIAGNOSIS — N179 Acute kidney failure, unspecified: Secondary | ICD-10-CM | POA: Insufficient documentation

## 2012-10-26 DIAGNOSIS — Z932 Ileostomy status: Secondary | ICD-10-CM | POA: Insufficient documentation

## 2012-10-26 DIAGNOSIS — K7689 Other specified diseases of liver: Secondary | ICD-10-CM | POA: Insufficient documentation

## 2012-10-26 DIAGNOSIS — N200 Calculus of kidney: Secondary | ICD-10-CM | POA: Insufficient documentation

## 2012-10-26 HISTORY — DX: Personal history of urinary calculi: Z87.442

## 2012-10-26 LAB — URINALYSIS, ROUTINE W REFLEX MICROSCOPIC
Bilirubin Urine: NEGATIVE
Glucose, UA: NEGATIVE mg/dL
Ketones, ur: 15 mg/dL — AB
Leukocytes, UA: NEGATIVE
Nitrite: NEGATIVE
Protein, ur: 100 mg/dL — AB

## 2012-10-26 LAB — URINE MICROSCOPIC-ADD ON

## 2012-10-26 MED ORDER — LACTATED RINGERS IV BOLUS (SEPSIS)
1000.0000 mL | Freq: Once | INTRAVENOUS | Status: AC
  Administered 2012-10-26: 1000 mL via INTRAVENOUS

## 2012-10-26 MED ORDER — PANTOPRAZOLE SODIUM 40 MG IV SOLR
40.0000 mg | Freq: Once | INTRAVENOUS | Status: AC
  Administered 2012-10-26: 40 mg via INTRAVENOUS
  Filled 2012-10-26: qty 40

## 2012-10-26 MED ORDER — ONDANSETRON HCL 4 MG/2ML IJ SOLN
4.0000 mg | Freq: Once | INTRAMUSCULAR | Status: AC
  Administered 2012-10-26: 4 mg via INTRAVENOUS
  Filled 2012-10-26: qty 2

## 2012-10-26 NOTE — ED Notes (Signed)
Vomiting x 2 today. States he has lost 12 pounds through his ileostomy over the past 12 hours.

## 2012-10-26 NOTE — ED Provider Notes (Addendum)
History    CSN: [id number] Arrival date & time 10/26/12  2225  First MD Initiated Contact with Patient 10/26/12 2306     Chief Complaint  Patient presents with  . Vomiting    (Consider location/radiation/quality/duration/timing/severity/associated sxs/prior Treatment) HPI This is a 62 year old male with a long-standing history of ileostomy status post total colectomy for ulcerative colitis. He thinks he ate some tainted food yesterday evening. He has had watery output from his ileostomy since this morning which worsened throughout the day. This evening he had 2 episodes of vomiting. He yesterday's he has lost about 12 pounds through his ileostomy and emesis. He has had bowel obstructions in the past but those were painful; he is not having abdominal pain with this episode. He has felt lightheaded and weak. He has also had muscle cramping. He has not had a fever. He was noted to be tachycardic on arrival.  Past Medical History  Diagnosis Date  . Thyroid disease   . Ulcerative colitis   . History of kidney stones    Past Surgical History  Procedure Laterality Date  . Appendectomy    . Tonsillectomy    . Ileostomy    . Partial nephrectomy    . Total colectomy     No family history on file. History  Substance Use Topics  . Smoking status: Never Smoker   . Smokeless tobacco: Not on file  . Alcohol Use: No    Review of Systems  All other systems reviewed and are negative.    Allergies  Review of patient's allergies indicates no known allergies.  Home Medications   Current Outpatient Rx  Name  Route  Sig  Dispense  Refill  . Levothyroxine Sodium (SYNTHROID PO)   Oral   Take by mouth.         . DULoxetine (CYMBALTA) 20 MG capsule   Oral   Take 20 mg by mouth daily.         Marland Kitchen ibuprofen (ADVIL,MOTRIN) 200 MG tablet   Oral   Take 400 mg by mouth every 6 (six) hours as needed. Patient used this medication for pain.          BP 124/77  Pulse 70  Temp(Src)  97.8 F (36.6 C) (Oral)  Resp 16  Wt 180 lb 6 oz (81.818 kg)  SpO2 97%  Physical Exam General: Well-developed, well-nourished male in no acute distress; appearance consistent with age of record HENT: normocephalic, atraumatic Eyes: pupils equal round and reactive to light; extraocular muscles intact Neck: supple Heart: regular rate and rhythm; tachycardic Lungs: clear to auscultation bilaterally Abdomen: soft; nondistended; nontender; no masses or hepatosplenomegaly; bowel sounds present; ileostomy right lower quadrant Extremities: No deformity; full range of motion; pulses normal; no edema Neurologic: Awake, alert and oriented; motor function intact in all extremities and symmetric; no facial droop Skin: Warm and dry Psychiatric: Normal mood and affect    ED Course  Procedures (including critical care time)   MDM   Nursing notes and vitals signs, including pulse oximetry, reviewed.  Summary of this visit's results, reviewed by myself:  Labs:  Results for orders placed during the hospital encounter of 10/26/12 (from the past 24 hour(s))  URINALYSIS, ROUTINE W REFLEX MICROSCOPIC     Status: Abnormal   Collection Time    10/26/12 10:42 PM      Result Value Range   Color, Urine AMBER (*) YELLOW   APPearance CLOUDY (*) CLEAR   Specific Gravity, Urine 1.023  1.005 -  1.030   pH 5.0  5.0 - 8.0   Glucose, UA NEGATIVE  NEGATIVE mg/dL   Hgb urine dipstick NEGATIVE  NEGATIVE   Bilirubin Urine NEGATIVE  NEGATIVE   Ketones, ur 15 (*) NEGATIVE mg/dL   Protein, ur 191 (*) NEGATIVE mg/dL   Urobilinogen, UA 0.2  0.0 - 1.0 mg/dL   Nitrite NEGATIVE  NEGATIVE   Leukocytes, UA NEGATIVE  NEGATIVE  URINE MICROSCOPIC-ADD ON     Status: Abnormal   Collection Time    10/26/12 10:42 PM      Result Value Range   Squamous Epithelial / LPF RARE  RARE   WBC, UA 0-2  <3 WBC/hpf   RBC / HPF 0-2  <3 RBC/hpf   Bacteria, UA FEW (*) RARE   Casts HYALINE CASTS (*) NEGATIVE   Crystals CA OXALATE  CRYSTALS (*) NEGATIVE  CBC WITH DIFFERENTIAL     Status: Abnormal   Collection Time    10/26/12 11:24 PM      Result Value Range   WBC 14.8 (*) 4.0 - 10.5 K/uL   RBC 6.22 (*) 4.22 - 5.81 MIL/uL   Hemoglobin 19.1 (*) 13.0 - 17.0 g/dL   HCT 47.8 (*) 29.5 - 62.1 %   MCV 84.7  78.0 - 100.0 fL   MCH 30.7  26.0 - 34.0 pg   MCHC 36.2 (*) 30.0 - 36.0 g/dL   RDW 30.8  65.7 - 84.6 %   Platelets 322  150 - 400 K/uL   Neutrophils Relative % 80 (*) 43 - 77 %   Neutro Abs 11.8 (*) 1.7 - 7.7 K/uL   Lymphocytes Relative 11 (*) 12 - 46 %   Lymphs Abs 1.7  0.7 - 4.0 K/uL   Monocytes Relative 8  3 - 12 %   Monocytes Absolute 1.2 (*) 0.1 - 1.0 K/uL   Eosinophils Relative 1  0 - 5 %   Eosinophils Absolute 0.1  0.0 - 0.7 K/uL   Basophils Relative 0  0 - 1 %   Basophils Absolute 0.0  0.0 - 0.1 K/uL  BASIC METABOLIC PANEL     Status: Abnormal   Collection Time    10/26/12 11:24 PM      Result Value Range   Sodium 138  135 - 145 mEq/L   Potassium 4.4  3.5 - 5.1 mEq/L   Chloride 97  96 - 112 mEq/L   CO2 19  19 - 32 mEq/L   Glucose, Bld 125 (*) 70 - 99 mg/dL   BUN 27 (*) 6 - 23 mg/dL   Creatinine, Ser 9.62 (*) 0.50 - 1.35 mg/dL   Calcium 95.2 (*) 8.4 - 10.5 mg/dL   GFR calc non Af Amer 25 (*) >90 mL/min   GFR calc Af Amer 29 (*) >90 mL/min  POCT I-STAT, CHEM 8     Status: Abnormal   Collection Time    10/27/12  3:41 AM      Result Value Range   Sodium 141  135 - 145 mEq/L   Potassium 3.9  3.5 - 5.1 mEq/L   Chloride 109  96 - 112 mEq/L   BUN 29 (*) 6 - 23 mg/dL   Creatinine, Ser 8.41 (*) 0.50 - 1.35 mg/dL   Glucose, Bld 324 (*) 70 - 99 mg/dL   Calcium, Ion 4.01 (*) 1.13 - 1.30 mmol/L   TCO2 20  0 - 100 mmol/L   Hemoglobin 14.6  13.0 - 17.0 g/dL   HCT 43.0  39.0 - 52.0 %  BASIC METABOLIC PANEL     Status: Abnormal   Collection Time    10/27/12  3:43 AM      Result Value Range   Sodium 139  135 - 145 mEq/L   Potassium 4.0  3.5 - 5.1 mEq/L   Chloride 104  96 - 112 mEq/L   CO2 22  19 - 32  mEq/L   Glucose, Bld 107 (*) 70 - 99 mg/dL   BUN 26 (*) 6 - 23 mg/dL   Creatinine, Ser 6.29 (*) 0.50 - 1.35 mg/dL   Calcium 52.8  8.4 - 41.3 mg/dL   GFR calc non Af Amer 37 (*) >90 mL/min   GFR calc Af Amer 43 (*) >90 mL/min   EKG Interpretation:  Date & Time: 10/27/2012 1:19 AM  Rate: 70  Rhythm: normal sinus rhythm  QRS Axis: normal  Intervals: normal  ST/T Wave abnormalities: nonspecific T wave changes  Conduction Disutrbances:none  Narrative Interpretation:   Old EKG Reviewed: none available     4:22 AM Patient's hypercalcemia has resolved and his acute renal insufficiency has improved and after vigorous hydration.  Patient advised of his laboratory values and need for followup with his primary care physician.   Hanley Seamen, MD 10/27/12 0424  Hanley Seamen, MD 10/27/12 [phone]

## 2012-10-27 ENCOUNTER — Other Ambulatory Visit: Payer: Self-pay

## 2012-10-27 LAB — BASIC METABOLIC PANEL
CO2: 19 mEq/L (ref 19–32)
CO2: 22 mEq/L (ref 19–32)
Calcium: 13.3 mg/dL (ref 8.4–10.5)
Chloride: 97 mEq/L (ref 96–112)
Chloride: 104 mEq/L (ref 96–112)
Creatinine, Ser: 1.9 mg/dL — ABNORMAL HIGH (ref 0.50–1.35)
Glucose, Bld: 125 mg/dL — ABNORMAL HIGH (ref 70–99)
Potassium: 4.4 mEq/L (ref 3.5–5.1)
Potassium: 4 mEq/L (ref 3.5–5.1)
Sodium: 138 mEq/L (ref 135–145)

## 2012-10-27 LAB — CBC WITH DIFFERENTIAL/PLATELET
Basophils Absolute: 0 10*3/uL (ref 0.0–0.1)
Eosinophils Relative: 1 % (ref 0–5)
Lymphocytes Relative: 11 % — ABNORMAL LOW (ref 12–46)
Lymphs Abs: 1.7 10*3/uL (ref 0.7–4.0)
MCV: 84.7 fL (ref 78.0–100.0)
Neutro Abs: 11.8 10*3/uL — ABNORMAL HIGH (ref 1.7–7.7)
Platelets: 322 10*3/uL (ref 150–400)
RBC: 6.22 MIL/uL — ABNORMAL HIGH (ref 4.22–5.81)
RDW: 13.8 % (ref 11.5–15.5)
WBC: 14.8 10*3/uL — ABNORMAL HIGH (ref 4.0–10.5)

## 2012-10-27 LAB — POCT I-STAT, CHEM 8
BUN: 29 mg/dL — ABNORMAL HIGH (ref 6–23)
Chloride: 109 mEq/L (ref 96–112)
Creatinine, Ser: 1.7 mg/dL — ABNORMAL HIGH (ref 0.50–1.35)
Sodium: 141 mEq/L (ref 135–145)
TCO2: 20 mmol/L (ref 0–100)

## 2012-10-27 MED ORDER — ONDANSETRON 8 MG PO TBDP: 8.0000 mg | ORAL_TABLET | Freq: Three times a day (TID) | ORAL | Status: DC | PRN

## 2012-10-27 MED ORDER — LACTATED RINGERS IV BOLUS (SEPSIS)
1000.0000 mL | Freq: Once | INTRAVENOUS | Status: AC
  Administered 2012-10-27: 1000 mL via INTRAVENOUS

## 2012-10-27 MED ORDER — SODIUM CHLORIDE 0.9 % IV BOLUS (SEPSIS)
1000.0000 mL | Freq: Once | INTRAVENOUS | Status: AC
  Administered 2012-10-27: 1000 mL via INTRAVENOUS

## 2012-10-27 NOTE — ED Notes (Signed)
Lab advises pt has a critical calcium of 13.3. Dr. Read Drivers made aware.

## 2012-10-28 ENCOUNTER — Encounter (HOSPITAL_BASED_OUTPATIENT_CLINIC_OR_DEPARTMENT_OTHER): Payer: Self-pay | Admitting: *Deleted

## 2012-10-28 ENCOUNTER — Emergency Department (HOSPITAL_BASED_OUTPATIENT_CLINIC_OR_DEPARTMENT_OTHER): Payer: BC Managed Care – PPO

## 2012-10-28 ENCOUNTER — Observation Stay (HOSPITAL_BASED_OUTPATIENT_CLINIC_OR_DEPARTMENT_OTHER)
Admission: EM | Admit: 2012-10-28 | Discharge: 2012-10-30 | DRG: 551 | Disposition: A | Discharge status: Home / Self Care | Payer: BC Managed Care – PPO | Attending: Internal Medicine | Admitting: Internal Medicine

## 2012-10-28 DIAGNOSIS — Z932 Ileostomy status: Secondary | ICD-10-CM

## 2012-10-28 DIAGNOSIS — N179 Acute kidney failure, unspecified: Secondary | ICD-10-CM

## 2012-10-28 DIAGNOSIS — R197 Diarrhea, unspecified: Secondary | ICD-10-CM

## 2012-10-28 DIAGNOSIS — E860 Dehydration: Secondary | ICD-10-CM | POA: Diagnosis present

## 2012-10-28 DIAGNOSIS — D72829 Elevated white blood cell count, unspecified: Secondary | ICD-10-CM

## 2012-10-28 DIAGNOSIS — K529 Noninfective gastroenteritis and colitis, unspecified: Secondary | ICD-10-CM | POA: Diagnosis present

## 2012-10-28 DIAGNOSIS — E878 Other disorders of electrolyte and fluid balance, not elsewhere classified: Secondary | ICD-10-CM

## 2012-10-28 DIAGNOSIS — R109 Unspecified abdominal pain: Secondary | ICD-10-CM | POA: Diagnosis present

## 2012-10-28 DIAGNOSIS — E872 Acidosis: Secondary | ICD-10-CM

## 2012-10-28 HISTORY — DX: Ileostomy status: Z93.2

## 2012-10-28 HISTORY — DX: Elevated white blood cell count, unspecified: D72.829

## 2012-10-28 HISTORY — DX: Diarrhea, unspecified: R19.7

## 2012-10-28 HISTORY — DX: Hypercalcemia: E83.52

## 2012-10-28 HISTORY — DX: Acute kidney failure, unspecified: N17.9

## 2012-10-28 LAB — COMPREHENSIVE METABOLIC PANEL
ALT: 45 U/L (ref 0–53)
ALT: 36 U/L (ref 0–53)
AST: 29 U/L (ref 0–37)
BUN: 28 mg/dL — ABNORMAL HIGH (ref 6–23)
CO2: 19 mEq/L (ref 19–32)
CO2: 21 mEq/L (ref 19–32)
Calcium: 11.9 mg/dL — ABNORMAL HIGH (ref 8.4–10.5)
Chloride: 100 mEq/L (ref 96–112)
Creatinine, Ser: 2.2 mg/dL — ABNORMAL HIGH (ref 0.50–1.35)
GFR calc Af Amer: 36 mL/min — ABNORMAL LOW (ref >90)
GFR calc non Af Amer: 31 mL/min — ABNORMAL LOW (ref >90)
GFR calc non Af Amer: 49 mL/min — ABNORMAL LOW (ref >90)
Glucose, Bld: 212 mg/dL — ABNORMAL HIGH (ref 70–99)
Sodium: 135 mEq/L (ref 135–145)
Total Bilirubin: 0.9 mg/dL (ref 0.3–1.2)
Total Protein: 9.9 g/dL — ABNORMAL HIGH (ref 6.0–8.3)

## 2012-10-28 LAB — CBC WITH DIFFERENTIAL/PLATELET
Basophils Absolute: 0 10*3/uL (ref 0.0–0.1)
Basophils Relative: 0 % (ref 0–1)
Eosinophils Absolute: 0.1 10*3/uL (ref 0.0–0.7)
Eosinophils Relative: 1 % (ref 0–5)
Eosinophils Relative: 1 % (ref 0–5)
HCT: 53.1 % — ABNORMAL HIGH (ref 39.0–52.0)
HCT: 47.6 % (ref 39.0–52.0)
Hemoglobin: 19 g/dL — ABNORMAL HIGH (ref 13.0–17.0)
Lymphocytes Relative: 12 % (ref 12–46)
Lymphocytes Relative: 19 % (ref 12–46)
Lymphs Abs: 1.6 10*3/uL (ref 0.7–4.0)
MCH: 30.6 pg (ref 26.0–34.0)
MCH: 31 pg (ref 26.0–34.0)
MCHC: 35.7 g/dL (ref 30.0–36.0)
MCV: 85.6 fL (ref 78.0–100.0)
MCV: 86.9 fL (ref 78.0–100.0)
Monocytes Absolute: 0.8 10*3/uL (ref 0.1–1.0)
Monocytes Absolute: 1.1 10*3/uL — ABNORMAL HIGH (ref 0.1–1.0)
Monocytes Relative: 6 % (ref 3–12)
Platelets: 335 10*3/uL (ref 150–400)
RBC: 6.2 MIL/uL — ABNORMAL HIGH (ref 4.22–5.81)
RDW: 12.4 % (ref 11.5–15.5)
WBC: 13.6 10*3/uL — ABNORMAL HIGH (ref 4.0–10.5)

## 2012-10-28 LAB — CLOSTRIDIUM DIFFICILE BY PCR: Toxigenic C. Difficile by PCR: NEGATIVE

## 2012-10-28 LAB — CG4 I-STAT (LACTIC ACID): Lactic Acid, Venous: 4.66 mmol/L — ABNORMAL HIGH (ref 0.5–2.2)

## 2012-10-28 MED ORDER — SODIUM CHLORIDE 0.9 % IV BOLUS (SEPSIS)
1000.0000 mL | Freq: Once | INTRAVENOUS | Status: AC
  Administered 2012-10-28: 1000 mL via INTRAVENOUS

## 2012-10-28 MED ORDER — DULOXETINE HCL 20 MG PO CPEP
20.0000 mg | ORAL_CAPSULE | Freq: Every day | ORAL | Status: DC
  Administered 2012-10-29: 20 mg via ORAL
  Filled 2012-10-28 (×3): qty 1

## 2012-10-28 MED ORDER — ACETAMINOPHEN 650 MG RE SUPP: 650.0000 mg | Freq: Four times a day (QID) | RECTAL | Status: DC | PRN

## 2012-10-28 MED ORDER — SODIUM CHLORIDE 0.9 % IV SOLN
INTRAVENOUS | Status: DC
  Administered 2012-10-28 – 2012-10-29 (×2): via INTRAVENOUS
  Administered 2012-10-29: 250 mL/h via INTRAVENOUS
  Administered 2012-10-29 (×3): via INTRAVENOUS

## 2012-10-28 MED ORDER — IOHEXOL 300 MG/ML  SOLN
50.0000 mL | Freq: Once | INTRAMUSCULAR | Status: AC | PRN
  Administered 2012-10-28: 50 mL via ORAL

## 2012-10-28 MED ORDER — HYDROCODONE-ACETAMINOPHEN 5-325 MG PO TABS: 1.0000 | ORAL_TABLET | ORAL | Status: DC | PRN

## 2012-10-28 MED ORDER — ONDANSETRON HCL 4 MG PO TABS: 4.0000 mg | ORAL_TABLET | Freq: Four times a day (QID) | ORAL | Status: DC | PRN

## 2012-10-28 MED ORDER — MORPHINE SULFATE 2 MG/ML IJ SOLN: 1.0000 mg | INTRAMUSCULAR | Status: DC | PRN

## 2012-10-28 MED ORDER — ONDANSETRON HCL 4 MG/2ML IJ SOLN
4.0000 mg | Freq: Four times a day (QID) | INTRAMUSCULAR | Status: DC | PRN
  Administered 2012-10-28: 4 mg via INTRAVENOUS
  Filled 2012-10-28: qty 2

## 2012-10-28 MED ORDER — SODIUM CHLORIDE 0.9 % IV SOLN
INTRAVENOUS | Status: DC
  Administered 2012-10-28: 16:00:00 via INTRAVENOUS

## 2012-10-28 MED ORDER — SODIUM CHLORIDE 0.9 % IV SOLN: INTRAVENOUS | Status: DC

## 2012-10-28 MED ORDER — ONDANSETRON HCL 4 MG/2ML IJ SOLN: 4.0000 mg | Freq: Once | INTRAMUSCULAR | Status: AC

## 2012-10-28 MED ORDER — ACETAMINOPHEN 325 MG PO TABS: 650.0000 mg | ORAL_TABLET | Freq: Four times a day (QID) | ORAL | Status: DC | PRN

## 2012-10-28 MED ORDER — ONDANSETRON HCL 4 MG/2ML IJ SOLN
INTRAMUSCULAR | Status: AC
  Administered 2012-10-28: 4 mg via INTRAVENOUS
  Filled 2012-10-28: qty 2

## 2012-10-28 NOTE — H&P (Signed)
[facility] History and Physical  David [name] ZOX:[id number] DOB: [birth year] DOA: 10/28/2012  Referring physician: ER physician PCP: No primary provider on file.   Chief Complaint: dehydration  HPI:  62 year old male with past medical history of ulcerative colitis and ileostomy (30 + years ago) who presented to [facility] for dehydration. Patient reported having intractable diarrhea, nausea and vomiting and associated abdominal cramping started few days ago. At that time (7/22) he was seen in ED but since he felt better with IV fluids he was discharged home. Shortly thereafter his symptoms recurred and since he non longer could keep anything down he ultimately returned to ED. No chest pain, no shortness of breath, no palpitations. No lightheadedness or loss of consciousness. No fever or chills.  In ED, BP was 117/86 and then 149/91; HR was 70 and T max 98.6 F. CBC revealed leukocytosis of 13.6. BMP revealed sodium of 134 and acute renal failure with creatinine of 2.20. CT abdomen did not reveal acute intraabdominal findings.  Assessment and Plan:  Principal Problem:   Abdominal  pain, nausea, vomiting, diarrhea - likely viral gastroenteritis - ileostomy present - check stool culture, check stool for C.diff - continue supportive care with IV fluids, antiemetics and analgesia - please note that the patient has received total of 3 L fluids prior to arrival to [facility]; I have started NS @ 125 cc/hr but he demanded additional 1 L bolus even though his BP was fine 144/91 and then he demanded higher rate than 125 cc/hr of NS infusion  Active Problems:   Leukocytosis, unspecified - possible viral gastroenteritis - WBC count trending down from 10/26/12 when it was 14.8   Acute renal failure - likely due to severe dehydration - trending down since 10/26/2012 when creatinine was 2.6, currently 1.5 - continue IV fluids   Hypercalcemia - likely due to dehydration - resolved with IV fludis  Manson Passey [facility] [phone]   Review of Systems:   Constitutional: Negative for fever, chills and malaise/fatigue. Negative for diaphoresis.  HENT: Negative for hearing loss, ear pain, nosebleeds, congestion, sore throat, neck pain, tinnitus and ear discharge.   Eyes: Negative for blurred vision, double vision, photophobia, pain, discharge and redness.  Respiratory: Negative for cough, hemoptysis, sputum production, shortness of breath, wheezing and stridor.   Cardiovascular: Negative for chest pain, palpitations, orthopnea, claudication and leg swelling.  Gastrointestinal: per HPI  Genitourinary: Negative for dysuria, urgency, frequency, hematuria and flank pain.  Musculoskeletal: Negative for myalgias, back pain, joint pain and falls.  Skin: Negative for itching and rash.  Neurological: Negative for dizziness and weakness. Negative for tingling, tremors, sensory change, speech change, focal weakness, loss of consciousness and headaches.  Endo/Heme/Allergies: Negative for environmental allergies and polydipsia. Does not bruise/bleed easily.  Psychiatric/Behavioral: Negative for suicidal ideas. The patient is not nervous/anxious.      Past Medical History  Diagnosis Date  . Thyroid disease   . Ulcerative colitis   . History of kidney stones   . Cancer    Past Surgical History  Procedure Laterality Date  . Appendectomy    . Tonsillectomy    . Ileostomy    . Partial nephrectomy    . Total colectomy     Social History:  reports that he has never smoked. He does not have any smokeless tobacco history on file. He reports that he does not drink alcohol or use illicit drugs.  No Known Allergies  Family History: Family medical history significant for HTN,  HLD   Prior to Admission medications   Medication Sig Start Date End Date Taking? Authorizing Provider  DULoxetine (CYMBALTA) 20 MG capsule Take 20 mg by mouth daily.    Historical Provider, MD  ibuprofen (ADVIL,MOTRIN) 200 MG tablet  Take 400 mg by mouth every 6 (six) hours as needed. Patient used this medication for pain.    Historical Provider, MD  Levothyroxine Sodium (SYNTHROID PO) Take by mouth.    Historical Provider, MD  ondansetron (ZOFRAN ODT) 8 MG disintegrating tablet Take 1 tablet (8 mg total) by mouth every 8 (eight) hours as needed. 10/27/12   Hanley Seamen, MD   Physical Exam: Filed Vitals:   10/28/12 0833 10/28/12 1201 10/28/12 1353 10/28/12 1428  BP: 117/86 122/78 120/72 149/91  Pulse: 88 88 70 72  Temp: 97.8 F (36.6 C) 98.5 F (36.9 C) 98.6 F (37 C) 98.3 F (36.8 C)  TempSrc: Oral Oral Oral Oral  Resp: 18 18 18 18   Height: 5\' 8"  (1.727 m)   5\' 8"  (1.727 m)  Weight: 82.237 kg (181 lb 4.8 oz)   82.237 kg (181 lb 4.8 oz)  SpO2: 100% 100% 100% 100%    Physical Exam  Constitutional: Appears well-developed and well-nourished. No distress.  HENT: Normocephalic. External right and left ear normal. Oropharynx is clear and moist.  Eyes: Conjunctivae and EOM are normal. PERRLA, no scleral icterus.  Neck: Normal ROM. Neck supple. No JVD. No tracheal deviation. No thyromegaly.  CVS: RRR, S1/S2 +, no murmurs, no gallops, no carotid bruit.  Pulmonary: Effort and breath sounds normal, no stridor, rhonchi, wheezes, rales.  Abdominal: Soft. BS +,  no distension, tenderness, rebound or guarding. (+) ileostomy in place Musculoskeletal: Normal range of motion. No edema and no tenderness.  Lymphadenopathy: No lymphadenopathy noted, cervical, inguinal. Neuro: Alert. Normal reflexes, muscle tone coordination. No cranial nerve deficit. Skin: Skin is warm and dry. No rash noted. Not diaphoretic. No erythema. No pallor.  Psychiatric: Normal mood and affect. Behavior, judgment, thought content normal.   Labs on Admission:  Basic Metabolic Panel:  Recent Labs Lab 10/26/12 2324 10/27/12 0341 10/27/12 0343 10/28/12 0845 10/28/12 1544  NA 138 141 139 134* 135  K 4.4 3.9 4.0 3.6 3.7  CL 97 109 104 92* 100  CO2  19  --  22 19 21   GLUCOSE 125* 105* 107* 212* 101*  BUN 27* 29* 26* 28* 24*  CREATININE 2.60* 1.70* 1.90* 2.20* 1.50*  CALCIUM 13.3*  --  10.2 11.9* 9.9  MG  --   --   --   --  1.7  PHOS  --   --   --   --  2.5   Liver Function Tests:  Recent Labs Lab 10/28/12 0845 10/28/12 1544  AST 37 29  ALT 45 36  ALKPHOS 103 76  BILITOT 1.2 0.9  PROT 9.9* 8.0  ALBUMIN 5.4* 4.3   No results found for this basename: LIPASE, AMYLASE,  in the last 168 hours No results found for this basename: AMMONIA,  in the last 168 hours CBC:  Recent Labs Lab 10/26/12 2324 10/27/12 0341 10/28/12 0845 10/28/12 1544  WBC 14.8*  --  13.6* 11.0*  NEUTROABS 11.8*  --  11.0* 7.7  HGB 19.1* 14.6 19.0* 17.0  HCT 52.7* 43.0 53.1* 47.6  MCV 84.7  --  85.6 86.9  PLT 322  --  335 283   Cardiac Enzymes: No results found for this basename: CKTOTAL, CKMB, CKMBINDEX, TROPONINI,  in the  last 168 hours BNP: No components found with this basename: POCBNP,  CBG: No results found for this basename: GLUCAP,  in the last 168 hours  Radiological Exams on Admission: Ct Abdomen Pelvis Wo Contrast 10/28/2012   * IMPRESSION:  1.  Linear pneumatosis within the stomach wall, likely benign and related to gastric distension. 2.  No evidence of bowel obstruction or extraluminal fluid collection status post total colectomy. 3.  Hepatic steatosis. 4.  Nonobstructing tiny right renal calculus.   Original Report Authenticated By: Carey Bullocks, M.D.    Code Status: Full Family Communication: Pt at bedside Disposition Plan: Admit for further evaluation  Manson Passey, MD  [facility] Pager [phone]  If 7PM-7AM, please contact night-coverage [email or web address] Password [email or web address] 10/28/2012, 4:57 PM

## 2012-10-28 NOTE — ED Notes (Signed)
Notified [facility] of bed 1434 at [facility] with Isaiah Serge

## 2012-10-28 NOTE — ED Notes (Signed)
Pt sleeping with cpap unit in place. resp easy and regular.

## 2012-10-28 NOTE — ED Provider Notes (Signed)
History    CSN: [id number] Arrival date & time 10/28/12  0272  First MD Initiated Contact with Patient 10/28/12 [phone]     Chief Complaint  Patient presents with  . Nausea  . Emesis   (Consider location/radiation/quality/duration/timing/severity/associated sxs/prior Treatment) HPI  Patient presents one day after discharge following a presentation for similar concerns, now with increasing abdominal pain, diarrhea, vomiting. Patient states that following resuscitation here one day ago he was substantially better. In the interval day, however, the patient states that his nausea, anorexia, abdominal discomfort, persistent watery output through his ileostomy, and vomiting have all returned, worse. The abdominal discomfort is diffuse, sort/crampy. Patient has lost 8 pounds since yesterday. He denies new fevers, chills, chest pain, dyspnea, confusion or disorientation. He does also complain of generalized weakness.   Past Medical History  Diagnosis Date  . Thyroid disease   . Ulcerative colitis   . History of kidney stones    Past Surgical History  Procedure Laterality Date  . Appendectomy    . Tonsillectomy    . Ileostomy    . Partial nephrectomy    . Total colectomy     History reviewed. No pertinent family history. History  Substance Use Topics  . Smoking status: Never Smoker   . Smokeless tobacco: Not on file  . Alcohol Use: No    Review of Systems  Constitutional:       Per HPI, otherwise negative  HENT:       Per HPI, otherwise negative  Respiratory:       Per HPI, otherwise negative  Cardiovascular:       Per HPI, otherwise negative  Gastrointestinal: Positive for nausea, vomiting, abdominal pain and diarrhea. Negative for blood in stool.  Endocrine:       Negative aside from HPI  Genitourinary:       Neg aside from HPI   Musculoskeletal:       Per HPI, otherwise negative  Skin: Negative.   Neurological: Negative for syncope.    Allergies  Review of  patient's allergies indicates no known allergies.  Home Medications   Current Outpatient Rx  Name  Route  Sig  Dispense  Refill  . DULoxetine (CYMBALTA) 20 MG capsule   Oral   Take 20 mg by mouth daily.         Marland Kitchen ibuprofen (ADVIL,MOTRIN) 200 MG tablet   Oral   Take 400 mg by mouth every 6 (six) hours as needed. Patient used this medication for pain.         . Levothyroxine Sodium (SYNTHROID PO)   Oral   Take by mouth.         . ondansetron (ZOFRAN ODT) 8 MG disintegrating tablet   Oral   Take 1 tablet (8 mg total) by mouth every 8 (eight) hours as needed.   10 tablet   0    BP 117/86  Pulse 88  Temp(Src) 97.8 F (36.6 C) (Oral)  Resp 18  Ht 5' 8"  (1.727 m)  Wt 181 lb 4.8 oz (82.237 kg)  BMI 27.57 kg/m2  SpO2 100% Physical Exam  Nursing note and vitals reviewed. Constitutional: He is oriented to person, place, and time. He appears well-developed. No distress.  HENT:  Head: Normocephalic and atraumatic.  Eyes: Conjunctivae and EOM are normal.  Cardiovascular: Normal rate and regular rhythm.   Pulmonary/Chest: Effort normal. No stridor. No respiratory distress.  Abdominal: He exhibits no distension. There is tenderness in the epigastric area and periumbilical  area. There is no rigidity, no rebound and no guarding.    Musculoskeletal: He exhibits no edema.  Neurological: He is alert and oriented to person, place, and time.  Skin: Skin is warm and dry.  Psychiatric: He has a normal mood and affect.    ED Course  Procedures (including critical care time) Labs Reviewed  CBC WITH DIFFERENTIAL  COMPREHENSIVE METABOLIC PANEL   No results found. No diagnosis found. I reviewed the patient's chart from yesterday.  Pulse oximetry 99% room air normal   Update: Initial labs significant abnormal with lactic acidosis, hypo-chloremia and elevated hemoglobin concerning for contracted state.  Patient continues to receive IV fluids. Patient's creatinine is also  increased again.  IV fluids and continued to run  Update: Patient feeling better.  Update: On repeat exam the patient appears substantially better.  MDM  Patient presents with concern over ongoing dehydration with persistent runny discharge into his ileostomy.,  The patient was better on discharge 2 days ago, but his symptoms have been progressive since that time.  Patient's labs today notable for lactic acidosis, leukocytosis, hypochloremia.  Absent fever, distress, chest pain, dyspnea, there suspicion for prerenal etiology, with less suspicion for concurrent infection.  However, given the patient's notable metabolic abnormalities he required admission for further evaluation and management.  Carmin Muskrat, MD 10/28/12 1248

## 2012-10-28 NOTE — ED Notes (Signed)
Pt amb to room 11 with slow, steady gait in nad. Pt reports he has ileostomy and was seen here yesterday for "severe dehydration" was d/c home, pt states he still feels nauseous and having large amt "water" out of his ileostomy.

## 2012-10-29 DIAGNOSIS — K529 Noninfective gastroenteritis and colitis, unspecified: Secondary | ICD-10-CM | POA: Diagnosis present

## 2012-10-29 DIAGNOSIS — K5289 Other specified noninfective gastroenteritis and colitis: Secondary | ICD-10-CM

## 2012-10-29 DIAGNOSIS — E860 Dehydration: Secondary | ICD-10-CM | POA: Diagnosis present

## 2012-10-29 DIAGNOSIS — D72829 Elevated white blood cell count, unspecified: Secondary | ICD-10-CM

## 2012-10-29 HISTORY — DX: Noninfective gastroenteritis and colitis, unspecified: K52.9

## 2012-10-29 HISTORY — DX: Dehydration: E86.0

## 2012-10-29 LAB — URINE CULTURE
Colony Count: NO GROWTH
Culture: NO GROWTH

## 2012-10-29 LAB — CBC
HCT: 41.8 % (ref 39.0–52.0)
Hemoglobin: 14.2 g/dL (ref 13.0–17.0)
MCH: 30.1 pg (ref 26.0–34.0)
RBC: 4.71 MIL/uL (ref 4.22–5.81)

## 2012-10-29 LAB — COMPREHENSIVE METABOLIC PANEL
AST: 21 U/L (ref 0–37)
Albumin: 3.2 g/dL — ABNORMAL LOW (ref 3.5–5.2)
CO2: 23 mEq/L (ref 19–32)
Calcium: 8.3 mg/dL — ABNORMAL LOW (ref 8.4–10.5)
Creatinine, Ser: 1.41 mg/dL — ABNORMAL HIGH (ref 0.50–1.35)
GFR calc non Af Amer: 53 mL/min — ABNORMAL LOW (ref >90)
Total Protein: 6.2 g/dL (ref 6.0–8.3)

## 2012-10-29 LAB — GLUCOSE, CAPILLARY: Glucose-Capillary: 80 mg/dL (ref 70–99)

## 2012-10-29 MED ORDER — SODIUM CHLORIDE 0.9 % IV BOLUS (SEPSIS)
500.0000 mL | Freq: Once | INTRAVENOUS | Status: AC
  Administered 2012-10-29: 10:00:00 via INTRAVENOUS

## 2012-10-29 NOTE — Progress Notes (Signed)
[facility] PROGRESS NOTE  David [name] GEX:[id number] DOB: 12/03/1950 DOA: 10/28/2012 PCP: [name], MD  Brief narrative 62 year old male with history of ulcerative colitis, status post total colectomy & ileostomy (30+ years ago), renal cancer status post partial nephrectomy, appendectomy was admitted to the [facility] on 10/28/12 with complaints of persistent diarrhea, generalized weakness and some nausea and vomiting. Patient recently returned after her vacation in [place]. He ate some homemade ice cream & fruits on the weekend. On Monday he started having increased output through the ileostomy, nausea and vomiting x2 without coffee-ground or blood. He was seen in the ED on 10/26/12, treated with IV fluids, improved and was discharged home. However patient continued to have increased ileostomy drainage-had to change every hour, nausea, dry heaves, decreased urine output, concentrated urine and generalized weakness. He denied fever or chills. According to patient and discussion with his PCP on 7/25-patient easily gets dehydrated with such episodes and requires aggressive IV fluid resuscitation. He denies any other family members with similar complaints or recent antibiotic use. Patient was admitted for further evaluation and management.  Assessment/Plan: 1. Nausea, vomiting, diarrhea & abdominal pain: DD-acute viral GE/food poisoning/UC flare. Clinically improving. Start on clear liquid diet and advance as tolerated. CT abdomen without acute findings. C. difficile PCR negative. No Abx started. 2. Dehydration: Aggressive IV fluid hydration and monitor urine output and clinically. 3. Mild leukocytosis: Likely secondary to problem #1. Resolved. 4. Acute renal failure: Secondary to dehydration. Per PCP, creatinine OP in the 1.1 range. Improving. Follow BMP in a.m. Strict input output chart. 5. History of ulcerative colitis, status post total colectomy & ileostomy: Management per  problem #1. 6. History of renal cancer, status post partial nephrectomy: Outpatient followup with urology/Dr. Karsten Ro 7. Hypercalcemia: Secondary to dehydration. Resolved.  Code Status: Full Family Communication: none Disposition Plan: Home when medically stable.   Consultants:  None  Procedures:  None  Antibiotics:  None   HPI/Subjective: No nausea, vomiting or abdominal pain. Wishes to try diet. Ileostomy output has decreased. States that he was feeling better after aggressive IV fluids yesterday in the ED but since then has again started feeling weaker, reduced urine output and urine looks darker.  Objective: Filed Vitals:   10/28/12 1428 10/28/12 2130 10/29/12 0509 10/29/12 0528  BP: 149/91 117/72 103/61   Pulse: 72 70 58   Temp: 98.3 F (36.8 C) 98.4 F (36.9 C) 97.7 F (36.5 C)   TempSrc: Oral Oral Oral   Resp: 18 20 20    Height: 5' 8"  (1.727 m)     Weight: 82.237 kg (181 lb 4.8 oz)   84.6 kg (186 lb 8.2 oz)  SpO2: 100% 98% 97%     Intake/Output Summary (Last 24 hours) at 10/29/12 1428 Last data filed at 10/29/12 1237  Gross per 24 hour  Intake   2680 ml  Output   1043 ml  Net   1637 ml   Filed Weights   10/28/12 0833 10/28/12 1428 10/29/12 0528  Weight: 82.237 kg (181 lb 4.8 oz) 82.237 kg (181 lb 4.8 oz) 84.6 kg (186 lb 8.2 oz)    Exam:   General exam: Comfortable. Mucosa still mildly dry.  Respiratory system: Clear. No increased work of breathing.  Cardiovascular system: S1 & S2 heard, RRR. No JVD, murmurs, gallops, clicks or pedal edema.  Gastrointestinal system: Abdomen is nondistended, soft and nontender. Normal bowel sounds heard. Ileostomy in right lower quadrant.  Central nervous system: Alert and oriented. No focal  neurological deficits.  Extremities: Symmetric 5 x 5 power.   Data Reviewed: Basic Metabolic Panel:  Recent Labs Lab 10/26/12 2324 10/27/12 0341 10/27/12 0343 10/28/12 0845 10/28/12 1544 10/29/12 0444  NA 138 141  139 134* 135 139  K 4.4 3.9 4.0 3.6 3.7 3.6  CL 97 109 104 92* 100 108  CO2 19  --  22 19 21 23   GLUCOSE 125* 105* 107* 212* 101* 88  BUN 27* 29* 26* 28* 24* 20  CREATININE 2.60* 1.70* 1.90* 2.20* 1.50* 1.41*  CALCIUM 13.3*  --  10.2 11.9* 9.9 8.3*  MG  --   --   --   --  1.7  --   PHOS  --   --   --   --  2.5  --    Liver Function Tests:  Recent Labs Lab 10/28/12 0845 10/28/12 1544 10/29/12 0444  AST 37 29 21  ALT 45 36 26  ALKPHOS 103 76 63  BILITOT 1.2 0.9 1.1  PROT 9.9* 8.0 6.2  ALBUMIN 5.4* 4.3 3.2*   No results found for this basename: LIPASE, AMYLASE,  in the last 168 hours No results found for this basename: AMMONIA,  in the last 168 hours CBC:  Recent Labs Lab 10/26/12 2324 10/27/12 0341 10/28/12 0845 10/28/12 1544 10/29/12 0444  WBC 14.8*  --  13.6* 11.0* 6.8  NEUTROABS 11.8*  --  11.0* 7.7  --   HGB 19.1* 14.6 19.0* 17.0 14.2  HCT 52.7* 43.0 53.1* 47.6 41.8  MCV 84.7  --  85.6 86.9 88.7  PLT 322  --  335 283 226   Cardiac Enzymes: No results found for this basename: CKTOTAL, CKMB, CKMBINDEX, TROPONINI,  in the last 168 hours BNP (last 3 results) No results found for this basename: PROBNP,  in the last 8760 hours CBG:  Recent Labs Lab 10/29/12 0732  GLUCAP 80    Recent Results (from the past 240 hour(s))  CLOSTRIDIUM DIFFICILE BY PCR     Status: None   Collection Time    10/28/12  3:33 PM      Result Value Range Status   C difficile by pcr NEGATIVE  NEGATIVE Final     Studies: Ct Abdomen Pelvis Wo Contrast  10/28/2012   *RADIOLOGY REPORT*  Clinical Data: Abdominal pain.  History of kidney stones, ulcerative colitis and total colectomy.  History of partial nephrectomy for kidney cancer.  Renal insufficiency.  CT ABDOMEN AND PELVIS WITHOUT CONTRAST  Technique:  Multidetector CT imaging of the abdomen and pelvis was performed following the standard protocol without intravenous contrast.  Comparison: Abdominal pelvic CT 07/20/2012 and 09/12/2010.   Findings: Minimal right basilar nodularity appears stable.  The lung bases are otherwise clear.  There is no pleural or pericardial effusion.  The liver demonstrates diffuse low density consistent with steatosis.  No focal abnormalities are identified.  The gallbladder, spleen and pancreas appear unremarkable.  A minute calcification is noted within the left adrenal gland, unchanged.  Postsurgical changes and calcifications along the posterior interpolar region of the left kidney are noted.  There is stable mild asymmetric perinephric soft tissue stranding on the right. There is a tiny right renal calyceal calculus, best seen on coronal image number 60.  There is no evidence of ureteral calculus or hydronephrosis.  The stomach is mildly distended.  There is thin air within the wall of the stomach, extending posteriorly, consistent with pneumatosis. No small bowel pneumatosis or distension is demonstrated.  There is  no free intraperitoneal air. A small duodenal lipoma appears unchanged.  Right lower quadrant ileostomy is noted status post total colectomy.  No inflammatory changes, fluid collections or enlarged lymph nodes are identified.  The bladder and prostate gland appear unremarkable.  Vascular assessment is limited without intravenous contrast.  No acute or worrisome osseous findings are identified.  IMPRESSION:  1.  Linear pneumatosis within the stomach wall, likely benign and related to gastric distension. 2.  No evidence of bowel obstruction or extraluminal fluid collection status post total colectomy. 3.  Hepatic steatosis. 4.  Nonobstructing tiny right renal calculus.   Original Report Authenticated By: Richardean Sale, M.D.     Additional labs:   Scheduled Meds: . DULoxetine  20 mg Oral Daily   Continuous Infusions: . sodium chloride 300 mL/hr at 10/29/12 1106    Principal Problem:   Abdominal  pain, other specified site Active Problems:   Diarrhea   Ileostomy present   Leukocytosis,  unspecified   Acute renal failure   Hypercalcemia    Time spent: 50 minutes.    [facility] Pager [phone].   If 8PM-8AM, please contact night-coverage at [email or web address], password [facility] 10/29/2012, 2:28 PM  LOS: 1 day

## 2012-10-29 NOTE — Progress Notes (Signed)
PT Cancellation Note  Patient Details Name: David [name] MRN: [id number] DOB: 1951-01-14   Cancelled Treatment:    Reason Eval/Treat Not Completed: PT screened, no needs identified, will sign off. Thanks.   Rebeca Alert, MPT Pager: [phone]

## 2012-10-30 LAB — GLUCOSE, CAPILLARY

## 2012-10-30 LAB — CBC
HCT: 36.7 % — ABNORMAL LOW (ref 39.0–52.0)
Hemoglobin: 12.6 g/dL — ABNORMAL LOW (ref 13.0–17.0)
MCHC: 34.3 g/dL (ref 30.0–36.0)

## 2012-10-30 LAB — BASIC METABOLIC PANEL
BUN: 14 mg/dL (ref 6–23)
Chloride: 107 mEq/L (ref 96–112)
GFR calc non Af Amer: 60 mL/min — ABNORMAL LOW (ref >90)
Glucose, Bld: 86 mg/dL (ref 70–99)
Potassium: 3.4 mEq/L — ABNORMAL LOW (ref 3.5–5.1)

## 2012-10-30 MED ORDER — POTASSIUM CHLORIDE CRYS ER 20 MEQ PO TBCR
40.0000 meq | EXTENDED_RELEASE_TABLET | Freq: Once | ORAL | Status: AC
  Administered 2012-10-30: 40 meq via ORAL
  Filled 2012-10-30: qty 2

## 2012-10-30 NOTE — Progress Notes (Signed)
Pt states he is unhappy with care from night hospitalist, as he wanted his fluids managed differently. Reassured with information given by Dr. Algis Liming; that night care providers use their professional judgement and information given by nurse and  Attending Providers such as himself. Pt verbalized understanding and states he is satisfied with care provided by nurses, and Dr. Algis Liming, but wishes his primary doctor would see him in the hospital, again he knows that is not possible.

## 2012-10-30 NOTE — Progress Notes (Signed)
Pt requests IVF be decreased to 200cc/hr.  States "I've been urinating a lot, it's clear urine, I feel like I'm better hydrated now.  Dr Waymon Amato was going to turn it down to 200cc/hr today anyway".  Daphane Shepherd PA made aware; order received and implemented.

## 2012-10-30 NOTE — Progress Notes (Signed)
Pt remains very frustrated with not being able to get any sleep with having to get up to urinate so frequently.  Requests IVF be decreased even more.  Discussed with pt that PA on call stated that she was not comfortable decreasing IVF any further due to his still elevated creatinine level and that Dr Waymon Amato would make that decision in the am.  Pt states that "I know my body and I know my kidneys are functioning better now.  My urine is clear, I'm urinating every 1-2 hours, and I can't get any rest".  Informed pt that we would be obtaining labs this am to evaluate kidney function, and that we should at least wait for those results to come back before adjusting the IVF.  Pt refuses, requesting for RN to saline lock the IVF at present time.  Done, per pt request.  M.Lynch PA made aware of pt's refusal of further IVF.

## 2012-10-30 NOTE — Discharge Summary (Signed)
Physician Discharge Summary  David [name] LOV:[id number] DOB: November 17, 1950 DOA: 10/28/2012  PCP: [name], MD  Admit date: 10/28/2012 Discharge date: 10/30/2012  Time spent: Greater than 30 minutes  Recommendations for Outpatient Follow-up:  1. Dr. Lona Kettle, PCP in 5 days with repeat labs (CBC & BMP). 2. Follow up outstanding stool culture reports that were sent in hospital on 10/28/2012.   Discharge Diagnoses:  Principal Problem:   Gastroenteritis, acute Active Problems:   Abdominal  pain, other specified site   Diarrhea   Ileostomy present   Leukocytosis, unspecified   Acute renal failure   Hypercalcemia   Dehydration   Discharge Condition: Improved & Stable  Diet recommendation: Heart Healthy diet.  Filed Weights   10/28/12 1428 10/29/12 0528 10/30/12 0613  Weight: 82.237 kg (181 lb 4.8 oz) 84.6 kg (186 lb 8.2 oz) 86.274 kg (190 lb 3.2 oz)    History of present illness:  62 year old male with history of ulcerative colitis, status post total colectomy & ileostomy (30+ years ago), renal cancer status post partial nephrectomy, appendectomy was admitted to the [facility] on 10/28/12 with complaints of persistent diarrhea, generalized weakness and some nausea and vomiting. Patient recently returned after her vacation in [place]. He ate some homemade ice cream & fruits on the weekend. On Monday he started having increased output through the ileostomy, nausea and vomiting x2 without coffee-ground or blood. He was seen in the ED on 10/26/12, treated with IV fluids, improved and was discharged home. However patient continued to have increased ileostomy drainage-had to change every hour, nausea, dry heaves, decreased urine output, concentrated urine and generalized weakness. He denied fever or chills. According to patient and discussion with his PCP on 7/25-patient easily gets dehydrated with such episodes and requires aggressive IV fluid resuscitation. He denies any other  family members with similar complaints or recent antibiotic use. Patient was admitted for further evaluation and management.  Hospital Course:  1. Nausea, vomiting, diarrhea & abdominal pain: DD-acute viral GE/food poisoning/UC flare. Patient was initially made n.p.o. He was aggressively hydrated with IV fluids. He was then started on clear liquid diet and his diet was advanced. He has gone well. He has no further nausea, vomiting or abdominal pain. His diarrhea has improved-ileostomy output has significantly reduced in the stool is more formed and returning to his normal. CT abdomen without acute findings. C. difficile PCR negative. No Abx started. Stool culture results are pending and should be followed as OP. 2. Dehydration: Per patient and his PCP, patient gets easily dehydrated during such episodes of GI loss and requires high volume IV fluid resuscitation. This is resolved after aggressive IV fluid resuscitation. 3. Mild leukocytosis: Likely secondary to problem #1. Resolved. 4. Acute renal failure: Secondary to dehydration. Per PCP, creatinine OP in the 1.1 range. Improving. Follow up BMP in next 5 days. Temporarily hold NSAIDs-patient advised. 5. History of ulcerative colitis, status post total colectomy & ileostomy: Management per problem #1. 6. History of renal cancer, status post partial nephrectomy: Outpatient followup with urology/Dr. Karsten Ro 7. Hypercalcemia: Secondary to dehydration. Resolved. 8. Hypokalemia: orally repleted prior to DC. 9. Mild Anemia: possibly dilutional. Rpt CBC in 5 days at OP follow up.  Procedures:  None   Consultations:  None  Discharge Exam:  Complaints: Patient states that he feels much better. No nausea, vomiting or abdominal pain. Tolerating regular consistency diet. Diarrhea through ileostomy has decreased significantly in amount and stool is more formed and returning to baseline. Overnight, patient  was frustrated due to urinary frequency from IV  fluids and eventually IVF was discontinued. Anxious to go home.  Filed Vitals:   10/29/12 0528 10/29/12 1300 10/29/12 2220 10/30/12 0613  BP:  120/71 136/71 124/75  Pulse:  76 63 54  Temp:  97.6 F (36.4 C) 98.3 F (36.8 C) 98.3 F (36.8 C)  TempSrc:  Oral Oral Oral  Resp:  20 16 18   Height:      Weight: 84.6 kg (186 lb 8.2 oz)   86.274 kg (190 lb 3.2 oz)  SpO2:  100% 100% 99%     General exam: Comfortable. Mucosa moist. Sitting on chair.  Respiratory system: Clear. No increased work of breathing.  Cardiovascular system: S1 and S2 heard, RRR. No JVD, murmurs or pedal edema.  Gastrointestinal system: Abdomen is nondistended, soft and nontender. Normal bowel sounds heard. Ileostomy in right lower quadrant.  Central nervous system: Alert and oriented. No focal neurological deficits.  Extremities: Symmetric 5 x 5 power.  Discharge Instructions      Discharge Orders   Future Orders Complete By Expires     Activity as tolerated - No restrictions  As directed     Call MD for:  extreme fatigue  As directed     Call MD for:  persistant dizziness or light-headedness  As directed     Call MD for:  persistant nausea and vomiting  As directed     Call MD for:  severe uncontrolled pain  As directed     Call MD for:  temperature >100.4  As directed     Call MD for:  As directed     Comments:      Worsening diarrhea.    Diet - low sodium heart healthy  As directed         Medication List    STOP taking these medications       ibuprofen 200 MG tablet  Commonly known as:  ADVIL,MOTRIN      TAKE these medications       DULoxetine 30 MG capsule  Commonly known as:  CYMBALTA  Take 30 mg by mouth at bedtime.     levothyroxine 175 MCG tablet  Commonly known as:  SYNTHROID, LEVOTHROID  Take 175 mcg by mouth daily before breakfast.     multivitamin with minerals Tabs  Take 1 tablet by mouth daily.     ondansetron 8 MG disintegrating tablet  Commonly known as:  ZOFRAN ODT   Take 1 tablet (8 mg total) by mouth every 8 (eight) hours as needed.       Follow-up Information   Follow up with [name], MD. Schedule an appointment as soon as possible for a visit in 5 days. (To be seen with repeat labs (CBC & BMP).)    Contact information:   [address] [place] [address] [phone]        The results of significant diagnostics from this hospitalization (including imaging, microbiology, ancillary and laboratory) are listed below for reference.    Significant Diagnostic Studies: Ct Abdomen Pelvis Wo Contrast  10/28/2012   *RADIOLOGY REPORT*  Clinical Data: Abdominal pain.  History of kidney stones, ulcerative colitis and total colectomy.  History of partial nephrectomy for kidney cancer.  Renal insufficiency.  CT ABDOMEN AND PELVIS WITHOUT CONTRAST  Technique:  Multidetector CT imaging of the abdomen and pelvis was performed following the standard protocol without intravenous contrast.  Comparison: Abdominal pelvic CT 07/20/2012 and 09/12/2010.  Findings: Minimal  right basilar nodularity appears stable.  The lung bases are otherwise clear.  There is no pleural or pericardial effusion.  The liver demonstrates diffuse low density consistent with steatosis.  No focal abnormalities are identified.  The gallbladder, spleen and pancreas appear unremarkable.  A minute calcification is noted within the left adrenal gland, unchanged.  Postsurgical changes and calcifications along the posterior interpolar region of the left kidney are noted.  There is stable mild asymmetric perinephric soft tissue stranding on the right. There is a tiny right renal calyceal calculus, best seen on coronal image number 60.  There is no evidence of ureteral calculus or hydronephrosis.  The stomach is mildly distended.  There is thin air within the wall of the stomach, extending posteriorly, consistent with pneumatosis. No small bowel pneumatosis or distension is demonstrated.  There  is no free intraperitoneal air. A small duodenal lipoma appears unchanged.  Right lower quadrant ileostomy is noted status post total colectomy.  No inflammatory changes, fluid collections or enlarged lymph nodes are identified.  The bladder and prostate gland appear unremarkable.  Vascular assessment is limited without intravenous contrast.  No acute or worrisome osseous findings are identified.  IMPRESSION:  1.  Linear pneumatosis within the stomach wall, likely benign and related to gastric distension. 2.  No evidence of bowel obstruction or extraluminal fluid collection status post total colectomy. 3.  Hepatic steatosis. 4.  Nonobstructing tiny right renal calculus.   Original Report Authenticated By: Richardean Sale, M.D.    Microbiology: Recent Results (from the past 240 hour(s))  URINE CULTURE     Status: None   Collection Time    10/28/12  3:33 PM      Result Value Range Status   Specimen Description URINE, RANDOM   Final   Special Requests NONE   Final   Culture  Setup Time 10/28/2012 23:22   Final   Colony Count NO GROWTH   Final   Culture NO GROWTH   Final   Report Status 10/29/2012 FINAL   Final  CLOSTRIDIUM DIFFICILE BY PCR     Status: None   Collection Time    10/28/12  3:33 PM      Result Value Range Status   C difficile by pcr NEGATIVE  NEGATIVE Final     Labs: Basic Metabolic Panel:  Recent Labs Lab 10/27/12 0343 10/28/12 0845 10/28/12 1544 10/29/12 0444 10/30/12 0536  NA 139 134* 135 139 140  K 4.0 3.6 3.7 3.6 3.4*  CL 104 92* 100 108 107  CO2 22 19 21 23 26   GLUCOSE 107* 212* 101* 88 86  BUN 26* 28* 24* 20 14  CREATININE 1.90* 2.20* 1.50* 1.41* 1.27  CALCIUM 10.2 11.9* 9.9 8.3* 8.3*  MG  --   --  1.7  --   --   PHOS  --   --  2.5  --   --    Liver Function Tests:  Recent Labs Lab 10/28/12 0845 10/28/12 1544 10/29/12 0444  AST 37 29 21  ALT 45 36 26  ALKPHOS 103 76 63  BILITOT 1.2 0.9 1.1  PROT 9.9* 8.0 6.2  ALBUMIN 5.4* 4.3 3.2*   No results  found for this basename: LIPASE, AMYLASE,  in the last 168 hours No results found for this basename: AMMONIA,  in the last 168 hours CBC:  Recent Labs Lab 10/26/12 2324 10/27/12 0341 10/28/12 0845 10/28/12 1544 10/29/12 0444 10/30/12 0536  WBC 14.8*  --  13.6* 11.0* 6.8 5.8  NEUTROABS 11.8*  --  11.0* 7.7  --   --   HGB 19.1* 14.6 19.0* 17.0 14.2 12.6*  HCT 52.7* 43.0 53.1* 47.6 41.8 36.7*  MCV 84.7  --  85.6 86.9 88.7 87.4  PLT 322  --  335 283 226 205   Cardiac Enzymes: No results found for this basename: CKTOTAL, CKMB, CKMBINDEX, TROPONINI,  in the last 168 hours BNP: BNP (last 3 results) No results found for this basename: PROBNP,  in the last 8760 hours CBG:  Recent Labs Lab 10/29/12 0732 10/30/12 0809  GLUCAP 80 85    Additional labs:  Venous lactate on admission: 4.66  Stool culture 10/28/12: Pending   Signed:  Carletta Feasel  [facility] 10/30/2012, 9:49 AM

## 2012-10-30 NOTE — Care Management Note (Signed)
    Page 1 of 1   10/30/2012     12:55:53 PM   CARE MANAGEMENT NOTE 10/30/2012  Patient:  David [name], David [name]   Account Number:  [id number]  Date Initiated:  10/29/2012  Documentation initiated by:  Ezekiel Ina  Subjective/Objective Assessment:   pt admitted with dehydration,     Action/Plan:   from home   Anticipated DC Date:  10/31/2012   Anticipated DC Plan:  HOME/SELF CARE      DC Planning Services  CM consult      Choice offered to / List presented to:             Status of service:  Completed, signed off Medicare Important Message given?  NO (If response is "NO", the following Medicare IM given date fields will be blank) Date Medicare IM given:   Date Additional Medicare IM given:    Discharge Disposition:  HOME/SELF CARE  Per UR Regulation:  Reviewed for med. necessity/level of care/duration of stay  If discussed at Long Length of Stay Meetings, dates discussed:    Comments:  10/29/12 MMcGibboney, RN, BSN Chart reviewed.

## 2012-11-01 LAB — STOOL CULTURE

## 2014-12-20 ENCOUNTER — Encounter (HOSPITAL_COMMUNITY): Payer: Self-pay | Admitting: Physician Assistant

## 2014-12-20 DIAGNOSIS — C642 Malignant neoplasm of left kidney, except renal pelvis: Secondary | ICD-10-CM

## 2014-12-20 DIAGNOSIS — M1712 Unilateral primary osteoarthritis, left knee: Secondary | ICD-10-CM

## 2014-12-20 HISTORY — DX: Malignant neoplasm of left kidney, except renal pelvis: C64.2

## 2014-12-20 HISTORY — DX: Unilateral primary osteoarthritis, left knee: M17.12

## 2014-12-20 NOTE — H&P (Signed)
TOTAL KNEE ADMISSION H&P  Patient is being admitted for left total knee arthroplasty.  Subjective:  Chief Complaint:left knee pain.  HPI: David [name], 64 y.o. male, has a history of pain and functional disability in the left knee due to arthritis and has failed non-surgical conservative treatments for greater than 12 weeks to includeNSAID's and/or analgesics, corticosteriod injections, viscosupplementation injections, flexibility and strengthening excercises, use of assistive devices, weight reduction as appropriate and activity modification.  Onset of symptoms was gradual, starting 10 years ago with gradually worsening course since that time. The patient noted no past surgery on the left knee(s).  Patient currently rates pain in the left knee(s) at 10 out of 10 with activity. Patient has night pain, worsening of pain with activity and weight bearing, pain that interferes with activities of daily living, crepitus and joint swelling.  Patient has evidence of subchondral sclerosis, periarticular osteophytes and joint space narrowing by imaging studies. There is no active infection.  Patient Active Problem List   Diagnosis Date Noted  . Cancer of left kidney 12/20/2014  . Primary localized osteoarthritis of left knee 12/20/2014  . Gastroenteritis, acute 10/29/2012  . Dehydration 10/29/2012  . Abdominal pain, other specified site 10/28/2012  . Diarrhea 10/28/2012  . Ileostomy present 10/28/2012  . Leukocytosis, unspecified 10/28/2012  . Acute renal failure 10/28/2012  . Hypercalcemia 10/28/2012   Past Medical History  Diagnosis Date  . Thyroid disease   . Ulcerative colitis   . History of kidney stones   . Cancer   . Primary localized osteoarthritis of left knee     Past Surgical History  Procedure Laterality Date  . Appendectomy    . Tonsillectomy    . Ileostomy    . Partial nephrectomy    . Total colectomy      No prescriptions prior to admission   No Known Allergies   Social History  Substance Use Topics  . Smoking status: Never Smoker   . Smokeless tobacco: Not on file  . Alcohol Use: No    Family History  Problem Relation Age of Onset  . Cancer Father     thyroid     Review of Systems  Constitutional: Negative.   HENT: Negative.   Eyes: Negative.   Respiratory: Positive for cough and wheezing. Negative for hemoptysis, sputum production and shortness of breath.   Cardiovascular: Negative.   Gastrointestinal: Positive for heartburn. Negative for nausea, vomiting, abdominal pain, diarrhea, constipation, blood in stool and melena.  Genitourinary: Negative.   Musculoskeletal: Positive for back pain and joint pain.  Skin: Negative.   Neurological: Negative.   Endo/Heme/Allergies: Negative.     Objective:  Physical Exam  Constitutional: He appears well-developed and well-nourished.  HENT:  Head: Normocephalic and atraumatic.  Mouth/Throat: Oropharynx is clear and moist.  Eyes: Pupils are equal, round, and reactive to light.  Neck: Neck supple.  Cardiovascular: Normal rate, regular rhythm and normal heart sounds.   Respiratory: Effort normal and breath sounds normal.  GI: Soft.  Patient has an ileostomy  Neurological:  Examination of both knees reveal pain medially and laterally. There is 1+ synovitis and 1+ crepitation. There is a mild varus deformity. His range of motion being 0-120 degrees. The knees are stable, normal  patellar tracking. Vascular exam: pulses 2+ and symmetric.   Skin: Skin is warm and dry.  Psychiatric: He has a normal mood and affect. His behavior is normal.    Vital signs in last 24 hours: Temp:  [97.6 F (  36.4 C)] 97.6 F (36.4 C) (09/14 1500) Pulse Rate:  [77] 77 (09/14 1500) BP: (141)/(92) 141/92 mmHg (09/14 1500) SpO2:  [99 %] 99 % (09/14 1500) Weight:  [82.555 kg (182 lb)] 82.555 kg (182 lb) (09/14 1500)  Labs:   Estimated body mass index is 27.68 kg/(m^2) as calculated from the following:   Height  as of this encounter: 5\' 8"  (1.727 m).   Weight as of this encounter: 82.555 kg (182 lb).   Imaging Review Plain radiographs demonstrate severe degenerative joint disease of the left knee(s). The overall alignment issignificant varus. The bone quality appears to be good for age and reported activity level.  Assessment/Plan:  End stage arthritis, left knee  Principal Problem:   Primary localized osteoarthritis of left knee Active Problems:   Ileostomy present   Cancer of left kidney   The patient history, physical examination, clinical judgment of the provider and imaging studies are consistent with end stage degenerative joint disease of the left knee(s) and total knee arthroplasty is deemed medically necessary. The treatment options including medical management, injection therapy arthroscopy and arthroplasty were discussed at length. The risks and benefits of total knee arthroplasty were presented and reviewed. The risks due to aseptic loosening, infection, stiffness, patella tracking problems, thromboembolic complications and other imponderables were discussed. The patient acknowledged the explanation, agreed to proceed with the plan and consent was signed. Patient is being admitted for inpatient treatment for surgery, pain control, PT, OT, prophylactic antibiotics, VTE prophylaxis, progressive ambulation and ADL's and discharge planning. The patient is planning to be discharged home with home health services

## 2014-12-22 ENCOUNTER — Encounter (HOSPITAL_COMMUNITY)
Admission: RE | Admit: 2014-12-22 | Discharge: 2014-12-22 | Disposition: A | Discharge status: Home / Self Care | Payer: BLUE CROSS/BLUE SHIELD | Source: Ambulatory Visit | Attending: Orthopedic Surgery | Admitting: Orthopedic Surgery

## 2014-12-22 ENCOUNTER — Encounter (HOSPITAL_COMMUNITY): Payer: Self-pay

## 2014-12-22 ENCOUNTER — Inpatient Hospital Stay (HOSPITAL_COMMUNITY)
Admission: RE | Admit: 2014-12-22 | Discharge: 2014-12-22 | Disposition: A | Discharge status: Home / Self Care | Payer: Self-pay | Source: Ambulatory Visit

## 2014-12-22 DIAGNOSIS — Z0183 Encounter for blood typing: Secondary | ICD-10-CM | POA: Insufficient documentation

## 2014-12-22 DIAGNOSIS — J45909 Unspecified asthma, uncomplicated: Secondary | ICD-10-CM | POA: Diagnosis not present

## 2014-12-22 DIAGNOSIS — G4733 Obstructive sleep apnea (adult) (pediatric): Secondary | ICD-10-CM | POA: Insufficient documentation

## 2014-12-22 DIAGNOSIS — Z79899 Other long term (current) drug therapy: Secondary | ICD-10-CM | POA: Diagnosis not present

## 2014-12-22 DIAGNOSIS — M179 Osteoarthritis of knee, unspecified: Secondary | ICD-10-CM | POA: Insufficient documentation

## 2014-12-22 DIAGNOSIS — Z01812 Encounter for preprocedural laboratory examination: Secondary | ICD-10-CM | POA: Insufficient documentation

## 2014-12-22 DIAGNOSIS — K219 Gastro-esophageal reflux disease without esophagitis: Secondary | ICD-10-CM | POA: Insufficient documentation

## 2014-12-22 DIAGNOSIS — E039 Hypothyroidism, unspecified: Secondary | ICD-10-CM | POA: Insufficient documentation

## 2014-12-22 DIAGNOSIS — R9431 Abnormal electrocardiogram [ECG] [EKG]: Secondary | ICD-10-CM | POA: Diagnosis not present

## 2014-12-22 DIAGNOSIS — Z01818 Encounter for other preprocedural examination: Secondary | ICD-10-CM | POA: Insufficient documentation

## 2014-12-22 HISTORY — DX: Gastro-esophageal reflux disease without esophagitis: K21.9

## 2014-12-22 HISTORY — DX: Anxiety disorder, unspecified: F41.9

## 2014-12-22 HISTORY — DX: Hypothyroidism, unspecified: E03.9

## 2014-12-22 LAB — COMPREHENSIVE METABOLIC PANEL
ALT: 36 U/L (ref 17–63)
AST: 36 U/L (ref 15–41)
Albumin: 4 g/dL (ref 3.5–5.0)
Alkaline Phosphatase: 76 U/L (ref 38–126)
Anion gap: 7 (ref 5–15)
BUN: 15 mg/dL (ref 6–20)
CHLORIDE: 108 mmol/L (ref 101–111)
CO2: 24 mmol/L (ref 22–32)
CREATININE: 1.03 mg/dL (ref 0.61–1.24)
Calcium: 10.1 mg/dL (ref 8.9–10.3)
GFR calc non Af Amer: >60 mL/min (ref >60)
Glucose, Bld: 93 mg/dL (ref 65–99)
Potassium: 4.5 mmol/L (ref 3.5–5.1)
SODIUM: 139 mmol/L (ref 135–145)
Total Bilirubin: 0.7 mg/dL (ref 0.3–1.2)
Total Protein: 7.5 g/dL (ref 6.5–8.1)

## 2014-12-22 LAB — CBC WITH DIFFERENTIAL/PLATELET
BASOS ABS: 0 10*3/uL (ref 0.0–0.1)
BASOS PCT: 0 %
EOS ABS: 0.3 10*3/uL (ref 0.0–0.7)
EOS PCT: 5 %
HCT: 43.8 % (ref 39.0–52.0)
Hemoglobin: 15 g/dL (ref 13.0–17.0)
Lymphocytes Relative: 22 %
Lymphs Abs: 1.6 10*3/uL (ref 0.7–4.0)
MCH: 30.5 pg (ref 26.0–34.0)
MCHC: 34.2 g/dL (ref 30.0–36.0)
MCV: 89 fL (ref 78.0–100.0)
Monocytes Absolute: 0.8 10*3/uL (ref 0.1–1.0)
Monocytes Relative: 12 %
Neutro Abs: 4.4 10*3/uL (ref 1.7–7.7)
Neutrophils Relative %: 61 %
PLATELETS: 249 10*3/uL (ref 150–400)
RBC: 4.92 MIL/uL (ref 4.22–5.81)
RDW: 12.2 % (ref 11.5–15.5)
WBC: 7.1 10*3/uL (ref 4.0–10.5)

## 2014-12-22 LAB — PROTIME-INR
INR: 1.03 (ref 0.00–1.49)
Prothrombin Time: 13.7 seconds (ref 11.6–15.2)

## 2014-12-22 LAB — APTT: APTT: 28 s (ref 24–37)

## 2014-12-22 LAB — TYPE AND SCREEN
ABO/RH(D): O POS
Antibody Screen: NEGATIVE

## 2014-12-22 LAB — SURGICAL PCR SCREEN
MRSA, PCR: NEGATIVE
Staphylococcus aureus: POSITIVE — AB

## 2014-12-22 LAB — ABO/RH: ABO/RH(D): O POS

## 2014-12-22 NOTE — Progress Notes (Signed)
PCP is Dr Harrington Challenger Denies seeing a cardiologist. Denies ever having a card cath, stress test,or echo.  States he had a sleep study many years ago (8-10 years ago) Request sent for report.

## 2014-12-22 NOTE — Progress Notes (Signed)
[facility] [place] called [phone]) [facility]

## 2014-12-22 NOTE — Pre-Procedure Instructions (Addendum)
David [name]  12/22/2014      [id number] [place], [place] - [address] [place] [address] Phone: [phone] Fax: [phone]    Your procedure is scheduled on Sept 26.  Report to [facility] Admitting at 530 A.M.  Call this number if you have problems the morning of surgery:  [phone]   Remember:  Do not eat food or drink liquids after midnight.  Take these medicines the morning of surgery with A SIP OF WATER alprazolam (Xanax) if needed, Advair inhaler-bring with you on the day of surgery, levothyroxine (Synthroid), Prilosec  Stop taking Asprin, Ibuprofen, Aleve, BC's, Goody's, Herbal medications, Fish Oil    Do not wear jewelry, make-up or nail polish.  Do not wear lotions, powders, or perfumes.  You may wear deodorant.  Do not shave 48 hours prior to surgery.  Men may shave face and neck.  Do not bring valuables to the hospital.  [facility] is not responsible for any belongings or valuables.  Contacts, dentures or bridgework may not be worn into surgery.  Leave your suitcase in the car.  After surgery it may be brought to your room.  For patients admitted to the hospital, discharge time will be determined by your treatment team.  Patients discharged the day of surgery will not be allowed to drive home.    Special instructions:  [place] - Preparing for Surgery  Before surgery, you can play an important role.  Because skin is not sterile, your skin needs to be as free of germs as possible.  You can reduce the number of germs on you skin by washing with CHG (chlorahexidine gluconate) soap before surgery.  CHG is an antiseptic cleaner which kills germs and bonds with the skin to continue killing germs even after washing.  Please DO NOT use if you have an allergy to CHG or antibacterial soaps.  If your skin becomes reddened/irritated stop using the CHG and inform your nurse when you arrive at  Short Stay.  Do not shave (including legs and underarms) for at least 48 hours prior to the first CHG shower.  You may shave your face.  Please follow these instructions carefully:   1.  Shower with CHG Soap the night before surgery and the   morning of Surgery.  2.  If you choose to wash your hair, wash your hair first as usual with your  normal shampoo.  3.  After you shampoo, rinse your hair and body thoroughly to remove the   Shampoo.  4.  Use CHG as you would any other liquid soap.  You can apply chg directly to the skin and wash gently with scrungie or a clean washcloth.  5.  Apply the CHG Soap to your body ONLY FROM THE NECK DOWN.     Do not use on open wounds or open sores.  Avoid contact with your eyes,  ears, mouth and genitals (private parts).  Wash genitals (private parts)  with your normal soap.  6.  Wash thoroughly, paying special attention to the area where your surgery will be performed.  7.  Thoroughly rinse your body with warm water from the neck down.  8.  DO NOT shower/wash with your normal soap after using and rinsing off   the CHG Soap.  9.  Pat yourself dry with a clean towel.  10.  Wear clean pajamas.            11.  Place clean sheets on your bed the night of your first shower and do not  sleep with pets.  Day of Surgery  Do not apply any lotions/deoderants the morning of surgery.  Please wear clean clothes to the hospital/surgery center.     Please read over the following fact sheets that you were given. Pain Booklet, Coughing and Deep Breathing, Blood Transfusion Information, MRSA Information and Surgical Site Infection Prevention

## 2014-12-22 NOTE — Progress Notes (Signed)
Positive results of pcr noted  To MSSA. David [name] was called and a message was left for him to call me back. [place] and [place] will need to be called into the pharmacy there.

## 2014-12-23 LAB — URINE CULTURE: CULTURE: NO GROWTH

## 2014-12-25 NOTE — Progress Notes (Signed)
Anesthesia Chart Review:  Pt is 64 year old male scheduled for L unicompartmental knee on 01/01/2015 with Dr. Noemi Chapel.   PMH includes: OSA, asthma, hypothyroidism, GERD. Never smoker. BMI 28.   Medications include: advair, levothyroxine, prilosec.   Preoperative labs reviewed.    EKG 12/22/2014: NSR. Anterior infarct possible but concern is for lead placement error per Dr. Olin Pia interpretation.   No CV symptoms documented at PAT visit.   If no changes, I anticipate pt can proceed with surgery as scheduled.   Willeen Cass, FNP-BC [facility] Short Stay Surgical Center/Anesthesiology Phone: [phone] 12/25/2014 12:54 PM

## 2014-12-31 NOTE — Anesthesia Preprocedure Evaluation (Addendum)
Anesthesia Evaluation  Patient identified by MRN, date of birth, ID band Patient awake    Reviewed: Allergy & Precautions, NPO status , Patient's Chart, lab work & pertinent test results  Airway Mallampati: I  TM Distance: >3 FB Neck ROM: Full    Dental no notable dental hx. (+) Caps   Pulmonary asthma , sleep apnea ,    Pulmonary exam normal breath sounds clear to auscultation       Cardiovascular negative cardio ROS Normal cardiovascular exam Rhythm:Regular Rate:Normal     Neuro/Psych PSYCHIATRIC DISORDERS Anxiety negative neurological ROS     GI/Hepatic negative GI ROS, Neg liver ROS, PUD, GERD  ,  Endo/Other  negative endocrine ROSHypothyroidism   Renal/GU Renal disease     Musculoskeletal  (+) Arthritis , Osteoarthritis,    Abdominal   Peds  Hematology negative hematology ROS (+)   Anesthesia Other Findings   Reproductive/Obstetrics                            Anesthesia Physical Anesthesia Plan  ASA: II  Anesthesia Plan: Regional and Spinal   Post-op Pain Management:    Induction: Intravenous  Airway Management Planned:   Additional Equipment:   Intra-op Plan:   Post-operative Plan:   Informed Consent: I have reviewed the patients History and Physical, chart, labs and discussed the procedure including the risks, benefits and alternatives for the proposed anesthesia with the patient or authorized representative who has indicated his/her understanding and acceptance.   Dental advisory given  Plan Discussed with: CRNA  Anesthesia Plan Comments:        Anesthesia Quick Evaluation

## 2015-01-01 ENCOUNTER — Encounter (HOSPITAL_COMMUNITY)
Admission: RE | Disposition: A | Discharge status: Home Health | Payer: Self-pay | Source: Ambulatory Visit | Attending: Orthopedic Surgery

## 2015-01-01 ENCOUNTER — Inpatient Hospital Stay (HOSPITAL_COMMUNITY): Payer: BLUE CROSS/BLUE SHIELD | Admitting: Anesthesiology

## 2015-01-01 ENCOUNTER — Inpatient Hospital Stay (HOSPITAL_COMMUNITY)
Admission: RE | Admit: 2015-01-01 | Discharge: 2015-01-04 | DRG: 470 | Disposition: A | Discharge status: Home Health | Payer: BLUE CROSS/BLUE SHIELD | Source: Ambulatory Visit | Attending: Orthopedic Surgery | Admitting: Orthopedic Surgery

## 2015-01-01 ENCOUNTER — Encounter (HOSPITAL_COMMUNITY): Payer: Self-pay | Admitting: *Deleted

## 2015-01-01 ENCOUNTER — Inpatient Hospital Stay (HOSPITAL_COMMUNITY): Payer: BLUE CROSS/BLUE SHIELD | Admitting: Emergency Medicine

## 2015-01-01 DIAGNOSIS — Z932 Ileostomy status: Secondary | ICD-10-CM

## 2015-01-01 DIAGNOSIS — G4733 Obstructive sleep apnea (adult) (pediatric): Secondary | ICD-10-CM | POA: Diagnosis present

## 2015-01-01 DIAGNOSIS — E039 Hypothyroidism, unspecified: Secondary | ICD-10-CM | POA: Diagnosis present

## 2015-01-01 DIAGNOSIS — M25562 Pain in left knee: Secondary | ICD-10-CM | POA: Diagnosis present

## 2015-01-01 DIAGNOSIS — Z85528 Personal history of other malignant neoplasm of kidney: Secondary | ICD-10-CM

## 2015-01-01 DIAGNOSIS — C642 Malignant neoplasm of left kidney, except renal pelvis: Secondary | ICD-10-CM | POA: Diagnosis present

## 2015-01-01 DIAGNOSIS — M1712 Unilateral primary osteoarthritis, left knee: Secondary | ICD-10-CM | POA: Diagnosis present

## 2015-01-01 DIAGNOSIS — Z22321 Carrier or suspected carrier of Methicillin susceptible Staphylococcus aureus: Secondary | ICD-10-CM | POA: Diagnosis not present

## 2015-01-01 DIAGNOSIS — D72829 Elevated white blood cell count, unspecified: Secondary | ICD-10-CM

## 2015-01-01 DIAGNOSIS — M179 Osteoarthritis of knee, unspecified: Secondary | ICD-10-CM | POA: Diagnosis present

## 2015-01-01 DIAGNOSIS — K219 Gastro-esophageal reflux disease without esophagitis: Secondary | ICD-10-CM | POA: Diagnosis present

## 2015-01-01 DIAGNOSIS — N390 Urinary tract infection, site not specified: Secondary | ICD-10-CM | POA: Diagnosis present

## 2015-01-01 DIAGNOSIS — Z809 Family history of malignant neoplasm, unspecified: Secondary | ICD-10-CM | POA: Diagnosis not present

## 2015-01-01 DIAGNOSIS — K5190 Ulcerative colitis, unspecified, without complications: Secondary | ICD-10-CM | POA: Diagnosis present

## 2015-01-01 DIAGNOSIS — Z905 Acquired absence of kidney: Secondary | ICD-10-CM | POA: Diagnosis present

## 2015-01-01 DIAGNOSIS — F419 Anxiety disorder, unspecified: Secondary | ICD-10-CM | POA: Diagnosis present

## 2015-01-01 DIAGNOSIS — M1710 Unilateral primary osteoarthritis, unspecified knee: Secondary | ICD-10-CM | POA: Diagnosis present

## 2015-01-01 HISTORY — PX: TOTAL KNEE ARTHROPLASTY: SHX125

## 2015-01-01 HISTORY — DX: Osteoarthritis of knee, unspecified: M17.9

## 2015-01-01 HISTORY — DX: Carrier or suspected carrier of methicillin susceptible Staphylococcus aureus: Z22.321

## 2015-01-01 HISTORY — DX: Obstructive sleep apnea (adult) (pediatric): G47.33

## 2015-01-01 HISTORY — PX: KNEE ARTHROSCOPY: SHX127

## 2015-01-01 SURGERY — ARTHROSCOPY, KNEE
Anesthesia: Regional | Site: Knee | Laterality: Left

## 2015-01-01 MED ORDER — ONDANSETRON HCL 4 MG/2ML IJ SOLN
INTRAMUSCULAR | Status: DC | PRN
  Administered 2015-01-01: 4 mg via INTRAVENOUS

## 2015-01-01 MED ORDER — POLYETHYLENE GLYCOL 3350 17 G PO PACK
17.0000 g | PACK | Freq: Two times a day (BID) | ORAL | Status: DC
  Administered 2015-01-02 – 2015-01-03 (×4): 17 g via ORAL
  Filled 2015-01-01 (×5): qty 1

## 2015-01-01 MED ORDER — LEVOTHYROXINE SODIUM 25 MCG PO TABS
175.0000 ug | ORAL_TABLET | Freq: Every day | ORAL | Status: DC
  Administered 2015-01-02 – 2015-01-04 (×3): 175 ug via ORAL
  Filled 2015-01-01 (×6): qty 1

## 2015-01-01 MED ORDER — FENTANYL CITRATE (PF) 250 MCG/5ML IJ SOLN
INTRAMUSCULAR | Status: AC
  Filled 2015-01-01: qty 5

## 2015-01-01 MED ORDER — MIDAZOLAM HCL 5 MG/5ML IJ SOLN
INTRAMUSCULAR | Status: DC | PRN
  Administered 2015-01-01: 0.5 mg via INTRAVENOUS
  Administered 2015-01-01: 1 mg via INTRAVENOUS
  Administered 2015-01-01: 0.5 mg via INTRAVENOUS

## 2015-01-01 MED ORDER — DEXAMETHASONE SODIUM PHOSPHATE 10 MG/ML IJ SOLN
10.0000 mg | Freq: Three times a day (TID) | INTRAMUSCULAR | Status: DC
  Administered 2015-01-01 – 2015-01-03 (×6): 10 mg via INTRAVENOUS
  Filled 2015-01-01 (×6): qty 1

## 2015-01-01 MED ORDER — DEXAMETHASONE SODIUM PHOSPHATE 10 MG/ML IJ SOLN
INTRAMUSCULAR | Status: DC | PRN
  Administered 2015-01-01: 10 mg via INTRAVENOUS

## 2015-01-01 MED ORDER — DEXAMETHASONE SODIUM PHOSPHATE 10 MG/ML IJ SOLN
INTRAMUSCULAR | Status: AC
  Filled 2015-01-01: qty 1

## 2015-01-01 MED ORDER — 0.9 % SODIUM CHLORIDE (POUR BTL) OPTIME
TOPICAL | Status: DC | PRN
  Administered 2015-01-01: 1000 mL

## 2015-01-01 MED ORDER — SODIUM CHLORIDE 0.9 % IR SOLN
Status: DC | PRN
  Administered 2015-01-01: 3000 mL

## 2015-01-01 MED ORDER — BUPIVACAINE-EPINEPHRINE 0.25% -1:200000 IJ SOLN
INTRAMUSCULAR | Status: DC | PRN
  Administered 2015-01-01: 30 mL

## 2015-01-01 MED ORDER — BUPIVACAINE IN DEXTROSE 0.75-8.25 % IT SOLN
INTRATHECAL | Status: DC | PRN
  Administered 2015-01-01: 2 mL via INTRATHECAL

## 2015-01-01 MED ORDER — CHLORHEXIDINE GLUCONATE 4 % EX LIQD: 60.0000 mL | Freq: Once | CUTANEOUS | Status: DC

## 2015-01-01 MED ORDER — MENTHOL 3 MG MT LOZG
1.0000 | LOZENGE | OROMUCOSAL | Status: DC | PRN
Start: 2015-01-01 — End: 2015-01-04

## 2015-01-01 MED ORDER — PROPOFOL 10 MG/ML IV BOLUS
INTRAVENOUS | Status: AC
  Filled 2015-01-01: qty 20

## 2015-01-01 MED ORDER — HYDROMORPHONE HCL 1 MG/ML IJ SOLN: 0.2500 mg | INTRAMUSCULAR | Status: DC | PRN

## 2015-01-01 MED ORDER — PNEUMOCOCCAL VAC POLYVALENT 25 MCG/0.5ML IJ INJ
0.5000 mL | INJECTION | INTRAMUSCULAR | Status: DC
  Filled 2015-01-01: qty 0.5

## 2015-01-01 MED ORDER — METOCLOPRAMIDE HCL 5 MG/ML IJ SOLN
5.0000 mg | Freq: Three times a day (TID) | INTRAMUSCULAR | Status: DC | PRN
  Filled 2015-01-01: qty 2

## 2015-01-01 MED ORDER — LIDOCAINE HCL (CARDIAC) 20 MG/ML IV SOLN
INTRAVENOUS | Status: AC
  Filled 2015-01-01: qty 5

## 2015-01-01 MED ORDER — POVIDONE-IODINE 7.5 % EX SOLN
Freq: Once | CUTANEOUS | Status: DC
  Filled 2015-01-01: qty 118

## 2015-01-01 MED ORDER — PROPOFOL 500 MG/50ML IV EMUL
INTRAVENOUS | Status: DC | PRN
  Administered 2015-01-01: 25 ug/kg/min via INTRAVENOUS

## 2015-01-01 MED ORDER — ALPRAZOLAM 0.25 MG PO TABS: 0.2500 mg | ORAL_TABLET | Freq: Three times a day (TID) | ORAL | Status: DC | PRN

## 2015-01-01 MED ORDER — HYDROMORPHONE HCL 1 MG/ML IJ SOLN
0.5000 mg | INTRAMUSCULAR | Status: DC | PRN
  Administered 2015-01-01 – 2015-01-03 (×7): 1 mg via INTRAVENOUS
  Filled 2015-01-01 (×8): qty 1

## 2015-01-01 MED ORDER — MEPERIDINE HCL 25 MG/ML IJ SOLN: 6.2500 mg | INTRAMUSCULAR | Status: DC | PRN

## 2015-01-01 MED ORDER — ACETAMINOPHEN 650 MG RE SUPP: 650.0000 mg | Freq: Four times a day (QID) | RECTAL | Status: DC | PRN

## 2015-01-01 MED ORDER — CEFAZOLIN SODIUM-DEXTROSE 2-3 GM-% IV SOLR
2.0000 g | INTRAVENOUS | Status: AC
  Administered 2015-01-01: 2 g via INTRAVENOUS

## 2015-01-01 MED ORDER — LIDOCAINE HCL (CARDIAC) 20 MG/ML IV SOLN
INTRAVENOUS | Status: DC | PRN
  Administered 2015-01-01: 50 mg via INTRAVENOUS

## 2015-01-01 MED ORDER — DEXTROSE 5 % IV SOLN
INTRAVENOUS | Status: DC | PRN
  Administered 2015-01-01: 08:00:00 via INTRAVENOUS

## 2015-01-01 MED ORDER — ACETAMINOPHEN 325 MG PO TABS: 650.0000 mg | ORAL_TABLET | Freq: Four times a day (QID) | ORAL | Status: DC | PRN

## 2015-01-01 MED ORDER — BUPIVACAINE-EPINEPHRINE (PF) 0.5% -1:200000 IJ SOLN
INTRAMUSCULAR | Status: DC | PRN
  Administered 2015-01-01: 30 mL via PERINEURAL

## 2015-01-01 MED ORDER — LACTATED RINGERS IV SOLN
INTRAVENOUS | Status: DC | PRN
  Administered 2015-01-01: 07:00:00 via INTRAVENOUS

## 2015-01-01 MED ORDER — TETRAHYDROZOLINE HCL 0.05 % OP SOLN
1.0000 [drp] | OPHTHALMIC | Status: DC | PRN
  Filled 2015-01-01: qty 15

## 2015-01-01 MED ORDER — OXYCODONE HCL 5 MG PO TABS
5.0000 mg | ORAL_TABLET | ORAL | Status: DC | PRN
  Administered 2015-01-01 – 2015-01-04 (×15): 10 mg via ORAL
  Filled 2015-01-01 (×16): qty 2

## 2015-01-01 MED ORDER — PANTOPRAZOLE SODIUM 40 MG PO TBEC
40.0000 mg | DELAYED_RELEASE_TABLET | Freq: Every day | ORAL | Status: DC
  Administered 2015-01-02 – 2015-01-03 (×2): 40 mg via ORAL
  Filled 2015-01-01 (×4): qty 1

## 2015-01-01 MED ORDER — MOMETASONE FURO-FORMOTEROL FUM 100-5 MCG/ACT IN AERO
2.0000 | INHALATION_SPRAY | Freq: Two times a day (BID) | RESPIRATORY_TRACT | Status: DC
  Administered 2015-01-04: 2 via RESPIRATORY_TRACT
  Filled 2015-01-01: qty 8.8

## 2015-01-01 MED ORDER — ONDANSETRON HCL 4 MG/2ML IJ SOLN
INTRAMUSCULAR | Status: AC
  Filled 2015-01-01: qty 2

## 2015-01-01 MED ORDER — PROMETHAZINE HCL 25 MG/ML IJ SOLN: 6.2500 mg | INTRAMUSCULAR | Status: DC | PRN

## 2015-01-01 MED ORDER — ADULT MULTIVITAMIN W/MINERALS CH
1.0000 | ORAL_TABLET | Freq: Every day | ORAL | Status: DC
  Administered 2015-01-01 – 2015-01-03 (×3): 1 via ORAL
  Filled 2015-01-01 (×4): qty 1

## 2015-01-01 MED ORDER — DIPHENHYDRAMINE HCL 12.5 MG/5ML PO ELIX: 12.5000 mg | ORAL_SOLUTION | ORAL | Status: DC | PRN

## 2015-01-01 MED ORDER — METOCLOPRAMIDE HCL 5 MG PO TABS: 5.0000 mg | ORAL_TABLET | Freq: Three times a day (TID) | ORAL | Status: DC | PRN

## 2015-01-01 MED ORDER — CEFAZOLIN SODIUM-DEXTROSE 2-3 GM-% IV SOLR
INTRAVENOUS | Status: AC
  Filled 2015-01-01: qty 50

## 2015-01-01 MED ORDER — BUPIVACAINE-EPINEPHRINE (PF) 0.25% -1:200000 IJ SOLN
INTRAMUSCULAR | Status: AC
  Filled 2015-01-01: qty 30

## 2015-01-01 MED ORDER — ALUM & MAG HYDROXIDE-SIMETH 200-200-20 MG/5ML PO SUSP
30.0000 mL | ORAL | Status: DC | PRN
Start: 2015-01-01 — End: 2015-01-04

## 2015-01-01 MED ORDER — MIDAZOLAM HCL 2 MG/2ML IJ SOLN
INTRAMUSCULAR | Status: AC
  Filled 2015-01-01: qty 4

## 2015-01-01 MED ORDER — INFLUENZA VAC SPLIT QUAD 0.5 ML IM SUSY
0.5000 mL | PREFILLED_SYRINGE | INTRAMUSCULAR | Status: DC
  Filled 2015-01-01: qty 0.5

## 2015-01-01 MED ORDER — LACTATED RINGERS IV SOLN: INTRAVENOUS | Status: DC

## 2015-01-01 MED ORDER — ONDANSETRON HCL 4 MG PO TABS: 4.0000 mg | ORAL_TABLET | Freq: Four times a day (QID) | ORAL | Status: DC | PRN

## 2015-01-01 MED ORDER — CEFUROXIME SODIUM 1.5 G IJ SOLR
INTRAMUSCULAR | Status: DC | PRN
  Administered 2015-01-01: 1.5 g

## 2015-01-01 MED ORDER — ONDANSETRON HCL 4 MG/2ML IJ SOLN: 4.0000 mg | Freq: Four times a day (QID) | INTRAMUSCULAR | Status: DC | PRN

## 2015-01-01 MED ORDER — CEFAZOLIN SODIUM-DEXTROSE 2-3 GM-% IV SOLR
2.0000 g | Freq: Four times a day (QID) | INTRAVENOUS | Status: AC
  Administered 2015-01-01 (×2): 2 g via INTRAVENOUS
  Filled 2015-01-01 (×2): qty 50

## 2015-01-01 MED ORDER — ASPIRIN EC 325 MG PO TBEC
325.0000 mg | DELAYED_RELEASE_TABLET | Freq: Every day | ORAL | Status: DC
  Administered 2015-01-02 – 2015-01-04 (×3): 325 mg via ORAL
  Filled 2015-01-01 (×3): qty 1

## 2015-01-01 MED ORDER — FENTANYL CITRATE (PF) 100 MCG/2ML IJ SOLN
INTRAMUSCULAR | Status: DC | PRN
  Administered 2015-01-01: 50 ug via INTRAVENOUS
  Administered 2015-01-01: 25 ug via INTRAVENOUS
  Administered 2015-01-01: 50 ug via INTRAVENOUS
  Administered 2015-01-01: 25 ug via INTRAVENOUS

## 2015-01-01 MED ORDER — DULOXETINE HCL 30 MG PO CPEP
30.0000 mg | ORAL_CAPSULE | Freq: Every day | ORAL | Status: DC
  Administered 2015-01-01 – 2015-01-03 (×3): 30 mg via ORAL
  Filled 2015-01-01 (×3): qty 1

## 2015-01-01 MED ORDER — POTASSIUM CHLORIDE IN NACL 20-0.9 MEQ/L-% IV SOLN
INTRAVENOUS | Status: DC
  Administered 2015-01-01 – 2015-01-02 (×3): via INTRAVENOUS
  Filled 2015-01-01 (×4): qty 1000

## 2015-01-01 MED ORDER — DOCUSATE SODIUM 100 MG PO CAPS
100.0000 mg | ORAL_CAPSULE | Freq: Two times a day (BID) | ORAL | Status: DC
  Administered 2015-01-02 – 2015-01-03 (×4): 100 mg via ORAL
  Filled 2015-01-01 (×7): qty 1

## 2015-01-01 MED ORDER — PHENOL 1.4 % MT LIQD: 1.0000 | OROMUCOSAL | Status: DC | PRN

## 2015-01-01 MED ORDER — OMEPRAZOLE MAGNESIUM 20 MG PO TBEC: 20.0000 mg | DELAYED_RELEASE_TABLET | Freq: Every day | ORAL | Status: DC

## 2015-01-01 MED ORDER — CEFUROXIME SODIUM 1.5 G IJ SOLR
INTRAMUSCULAR | Status: AC
  Filled 2015-01-01: qty 1.5

## 2015-01-01 MED ORDER — CELECOXIB 200 MG PO CAPS
200.0000 mg | ORAL_CAPSULE | Freq: Two times a day (BID) | ORAL | Status: DC
  Administered 2015-01-01 – 2015-01-03 (×6): 200 mg via ORAL
  Filled 2015-01-01 (×6): qty 1

## 2015-01-01 SURGICAL SUPPLY — 81 items
APL SKNCLS STERI-STRIP NONHPOA (GAUZE/BANDAGES/DRESSINGS) ×2
BANDAGE ELASTIC 4 VELCRO ST LF (GAUZE/BANDAGES/DRESSINGS) ×3 IMPLANT
BANDAGE ELASTIC 6 VELCRO ST LF (GAUZE/BANDAGES/DRESSINGS) ×3 IMPLANT
BANDAGE ESMARK 6X9 LF (GAUZE/BANDAGES/DRESSINGS) ×2 IMPLANT
BENZOIN TINCTURE PRP APPL 2/3 (GAUZE/BANDAGES/DRESSINGS) ×2 IMPLANT
BLADE SAG 18X100X1.27 (BLADE) ×2 IMPLANT
BLADE SAGITTAL 25.0X1.19X90 (BLADE) ×2 IMPLANT
BLADE SURG 11 STRL SS (BLADE) ×3 IMPLANT
BNDG CMPR 9X6 STRL LF SNTH (GAUZE/BANDAGES/DRESSINGS) ×2
BNDG ESMARK 6X9 LF (GAUZE/BANDAGES/DRESSINGS) ×3
BOWL SMART MIX CTS (DISPOSABLE) ×3 IMPLANT
CAPT KNEE TOTAL 3 ATTUNE ×2 IMPLANT
CEMENT HV SMART SET (Cement) ×6 IMPLANT
CLSR STERI-STRIP ANTIMIC 1/2X4 (GAUZE/BANDAGES/DRESSINGS) ×2 IMPLANT
COVER SURGICAL LIGHT HANDLE (MISCELLANEOUS) ×3 IMPLANT
CUFF TOURNIQUET SINGLE 34IN LL (TOURNIQUET CUFF) ×2 IMPLANT
DRAPE EXTREMITY T 121X128X90 (DRAPE) ×3 IMPLANT
DRAPE PROXIMA HALF (DRAPES) ×3 IMPLANT
DRAPE U-SHAPE 47X51 STRL (DRAPES) ×3 IMPLANT
DRSG ADAPTIC 3X8 NADH LF (GAUZE/BANDAGES/DRESSINGS) ×3 IMPLANT
DRSG AQUACEL AG ADV 3.5X10 (GAUZE/BANDAGES/DRESSINGS) ×2 IMPLANT
DRSG PAD ABDOMINAL 8X10 ST (GAUZE/BANDAGES/DRESSINGS) ×6 IMPLANT
DURAPREP 26ML APPLICATOR (WOUND CARE) ×3 IMPLANT
ELECT CAUTERY BLADE 6.4 (BLADE) ×3 IMPLANT
ELECT REM PT RETURN 9FT ADLT (ELECTROSURGICAL) ×3
ELECTRODE REM PT RTRN 9FT ADLT (ELECTROSURGICAL) ×2 IMPLANT
EVACUATOR 1/8 PVC DRAIN (DRAIN) IMPLANT
FACESHIELD WRAPAROUND (MASK) ×3 IMPLANT
FACESHIELD WRAPAROUND OR TEAM (MASK) ×1 IMPLANT
GAUZE SPONGE 4X4 12PLY STRL (GAUZE/BANDAGES/DRESSINGS) ×3 IMPLANT
GLOVE BIO SURGEON STRL SZ7 (GLOVE) ×7 IMPLANT
GLOVE BIOGEL PI IND STRL 7.0 (GLOVE) ×2 IMPLANT
GLOVE BIOGEL PI IND STRL 7.5 (GLOVE) ×2 IMPLANT
GLOVE BIOGEL PI INDICATOR 7.0 (GLOVE) ×1
GLOVE BIOGEL PI INDICATOR 7.5 (GLOVE) ×1
GLOVE SS BIOGEL STRL SZ 7.5 (GLOVE) ×2 IMPLANT
GLOVE SUPERSENSE BIOGEL SZ 7.5 (GLOVE) ×1
GOWN STRL REUS W/ TWL LRG LVL3 (GOWN DISPOSABLE) ×4 IMPLANT
GOWN STRL REUS W/ TWL XL LVL3 (GOWN DISPOSABLE) ×6 IMPLANT
GOWN STRL REUS W/TWL LRG LVL3 (GOWN DISPOSABLE) ×6
GOWN STRL REUS W/TWL XL LVL3 (GOWN DISPOSABLE) ×9
HANDPIECE INTERPULSE COAX TIP (DISPOSABLE) ×3
HOOD PEEL AWAY FACE SHEILD DIS (HOOD) ×11 IMPLANT
IMMOBILIZER KNEE 22 (SOFTGOODS) ×2 IMPLANT
IMMOBILIZER KNEE 22 UNIV (SOFTGOODS) ×2 IMPLANT
KIT BASIN OR (CUSTOM PROCEDURE TRAY) ×3 IMPLANT
KIT ROOM TURNOVER OR (KITS) ×3 IMPLANT
MANIFOLD NEPTUNE II (INSTRUMENTS) ×3 IMPLANT
NDL SPNL 18GX3.5 QUINCKE PK (NEEDLE) ×1 IMPLANT
NEEDLE SPNL 18GX3.5 QUINCKE PK (NEEDLE) ×9 IMPLANT
NS IRRIG 1000ML POUR BTL (IV SOLUTION) ×3 IMPLANT
PACK TOTAL JOINT (CUSTOM PROCEDURE TRAY) ×3 IMPLANT
PACK UNIVERSAL I (CUSTOM PROCEDURE TRAY) ×3 IMPLANT
PAD ABD 8X10 STRL (GAUZE/BANDAGES/DRESSINGS) ×2 IMPLANT
PAD ARMBOARD 7.5X6 YLW CONV (MISCELLANEOUS) ×6 IMPLANT
PAD CAST 4YDX4 CTTN HI CHSV (CAST SUPPLIES) ×2 IMPLANT
PADDING CAST COTTON 4X4 STRL (CAST SUPPLIES) ×3
PADDING CAST COTTON 6X4 STRL (CAST SUPPLIES) ×3 IMPLANT
RUBBERBAND STERILE (MISCELLANEOUS) ×3 IMPLANT
SET ARTHROSCOPY TUBING (MISCELLANEOUS) ×3
SET ARTHROSCOPY TUBING LN (MISCELLANEOUS) ×1 IMPLANT
SET HNDPC FAN SPRY TIP SCT (DISPOSABLE) ×2 IMPLANT
SPONGE GAUZE 4X4 12PLY STER LF (GAUZE/BANDAGES/DRESSINGS) ×2 IMPLANT
STRIP CLOSURE SKIN 1/2X4 (GAUZE/BANDAGES/DRESSINGS) ×3 IMPLANT
SUCTION FRAZIER TIP 10 FR DISP (SUCTIONS) ×3 IMPLANT
SUT ETHIBOND NAB CT1 #1 30IN (SUTURE) ×2 IMPLANT
SUT MNCRL AB 3-0 PS2 18 (SUTURE) ×3 IMPLANT
SUT VIC AB 0 CT1 27 (SUTURE) ×9
SUT VIC AB 0 CT1 27XBRD ANBCTR (SUTURE) ×5 IMPLANT
SUT VIC AB 1 CT1 27 (SUTURE) ×3
SUT VIC AB 1 CT1 27XBRD ANBCTR (SUTURE) ×1 IMPLANT
SUT VIC AB 2-0 CT1 27 (SUTURE) ×6
SUT VIC AB 2-0 CT1 TAPERPNT 27 (SUTURE) ×4 IMPLANT
SYR 30ML LL (SYRINGE) ×2 IMPLANT
SYR 30ML SLIP (SYRINGE) ×2 IMPLANT
SYR 3ML LL SCALE MARK (SYRINGE) ×2 IMPLANT
SYRINGE 35CC LL (MISCELLANEOUS) ×2 IMPLANT
TOWEL OR 17X24 6PK STRL BLUE (TOWEL DISPOSABLE) ×3 IMPLANT
TOWEL OR 17X26 10 PK STRL BLUE (TOWEL DISPOSABLE) ×3 IMPLANT
TRAY FOLEY BAG SILVER LF 16FR (SET/KITS/TRAYS/PACK) ×2 IMPLANT
TRAY FOLEY CATH 16FRSI W/METER (SET/KITS/TRAYS/PACK) IMPLANT

## 2015-01-01 NOTE — Transfer of Care (Signed)
Immediate Anesthesia Transfer of Care Note  Patient: David [name]  Procedure(s) Performed: Procedure(s): ARTHROSCOPY KNEE (Left) TOTAL KNEE ARTHROPLASTY (Left)  Patient Location: PACU  Anesthesia Type:Spinal  Level of Consciousness: awake, alert , oriented and patient cooperative  Airway & Oxygen Therapy: Patient Spontanous Breathing and Patient connected to nasal cannula oxygen  Post-op Assessment: Report given to RN and Post -op Vital signs reviewed and stable  Post vital signs: Reviewed and stable  Last Vitals:  Filed Vitals:   01/01/15 0606  BP: 151/95  Pulse: 69  Temp: 36.2 C  Resp: 20    Complications: No apparent anesthesia complications

## 2015-01-01 NOTE — Anesthesia Postprocedure Evaluation (Signed)
Anesthesia Post Note  Patient: David [name]  Procedure(s) Performed: Procedure(s) (LRB): ARTHROSCOPY KNEE (Left) TOTAL KNEE ARTHROPLASTY (Left)  Anesthesia type: Spinal  Patient location: PACU  Post pain: Pain level controlled  Post assessment: Post-op Vital signs reviewed  Last Vitals: BP 155/89 mmHg  Pulse 66  Temp(Src) 36.1 C  Resp 15  Ht 5\' 8"  (1.727 m)  Wt 181 lb 6.4 oz (82.283 kg)  BMI 27.59 kg/m2  SpO2 98%  Post vital signs: Reviewed  Level of consciousness: sedated  Complications: No apparent anesthesia complications

## 2015-01-01 NOTE — Progress Notes (Signed)
Utilization review completed.  

## 2015-01-01 NOTE — Evaluation (Signed)
Physical Therapy Evaluation Patient Details Name: David [name] MRN: [id number] DOB: 15-Feb-1951 Today's Date: 01/01/2015   History of Present Illness  Patient is a 64 y/o male s/p L TKA. PMH includes MSSA, anxiety, ileostomy, hypothyroidism.  Clinical Impression  Patient presents with pain and post surgical deficits LLE s/p L TKA. Ambulation and mobility limited due to low BP and feeling faint resulting in need to sit in chair and elevate LEs. BP 85/55. RN present in room. Pt feeling better when lunch arrived. Eager and motivated to walk. Instructed pt in exercises. Pt will have support at home from wife. Will follow acutely to maximize independence and mobility prior to return home.     Follow Up Recommendations Home health PT;Supervision/Assistance - 24 hour    Equipment Recommendations  Rolling walker with 5" wheels    Recommendations for Other Services       Precautions / Restrictions Precautions Precautions: Knee Precaution Booklet Issued: No Precaution Comments: Reviewed no pillow under knee and precautions. Restrictions Weight Bearing Restrictions: Yes LLE Weight Bearing: Weight bearing as tolerated      Mobility  Bed Mobility Overal bed mobility: Needs Assistance Bed Mobility: Supine to Sit     Supine to sit: Supervision;HOB elevated        Transfers Overall transfer level: Needs assistance Equipment used: Rolling walker (2 wheeled) Transfers: Sit to/from Stand Sit to Stand: Min guard         General transfer comment: Min guard for safety. Stood from [employer].   Ambulation/Gait Ambulation/Gait assistance: Min guard Ambulation Distance (Feet): 10 Feet Assistive device: Rolling walker (2 wheeled) Gait Pattern/deviations: Step-to pattern;Decreased stance time - left;Decreased step length - right   Gait velocity interpretation: <1.8 ft/sec, indicative of risk for recurrent falls General Gait Details: Slow, steady gait. Mild left knee instability noted.  Ambulation limited due to feeling faint- diaphoretic, pallor. Sat pt in chair.  Stairs            Wheelchair Mobility    Modified Rankin (Stroke Patients Only)       Balance Overall balance assessment: Needs assistance Sitting-balance support: Feet supported;No upper extremity supported Sitting balance-Leahy Scale: Good     Standing balance support: During functional activity Standing balance-Leahy Scale: Fair                               Pertinent Vitals/Pain Pain Assessment: Faces Faces Pain Scale: Hurts little more Pain Location: left knee Pain Descriptors / Indicators: Dull;Aching Pain Intervention(s): Monitored during session;Repositioned;Limited activity within patient's tolerance    Home Living Family/patient expects to be discharged to:: Private residence Living Arrangements: Spouse/significant other Available Help at Discharge: Family;Available 24 hours/day Type of Home: House Home Access: Stairs to enter Entrance Stairs-Rails: None Entrance Stairs-Number of Steps: 2 + 1-2 Home Layout: One level Home Equipment: Crutches      Prior Function Level of Independence: Independent               Hand Dominance        Extremity/Trunk Assessment   Upper Extremity Assessment: Defer to OT evaluation           Lower Extremity Assessment: LLE deficits/detail   LLE Deficits / Details: Limited AROM/strength secondary to pain and recent surgery.     Communication   Communication: No difficulties  Cognition Arousal/Alertness: Awake/alert Behavior During Therapy: WFL for tasks assessed/performed Overall Cognitive Status: Within Functional Limits for tasks assessed  General Comments General comments (skin integrity, edema, etc.): Pt became diaphoretic, pallor, feeling "faint." Had pt sit down in chair and elevated BLEs. Provided rag and got him some snacks. BP 85/55. RN present in room.    Exercises  Total Joint Exercises Ankle Circles/Pumps: Both;10 reps;Supine Quad Sets: Both;10 reps;Supine      Assessment/Plan    PT Assessment Patient needs continued PT services  PT Diagnosis Difficulty walking;Acute pain   PT Problem List Decreased strength;Pain;Decreased range of motion;Impaired sensation;Decreased activity tolerance;Decreased balance;Decreased mobility  PT Treatment Interventions Balance training;Gait training;Stair training;Functional mobility training;Therapeutic activities;Therapeutic exercise;Patient/family education   PT Goals (Current goals can be found in the Care Plan section) Acute Rehab PT Goals Patient Stated Goal: to return to independence PT Goal Formulation: With patient Time For Goal Achievement: 01/15/15 Potential to Achieve Goals: Fair    Frequency 7X/week   Barriers to discharge        Co-evaluation               End of Session Equipment Utilized During Treatment: Gait belt Activity Tolerance: Treatment limited secondary to medical complications (Comment) (low BP) Patient left: in chair;with call bell/phone within reach Nurse Communication: Mobility status         Time: 4656-8127 PT Time Calculation (min) (ACUTE ONLY): 27 min   Charges:   PT Evaluation $Initial PT Evaluation Tier I: 1 Procedure PT Treatments $Therapeutic Activity: 8-22 mins   PT G Codes:        David [name] A Charlise Giovanetti 01/01/2015, 2:03 PM [name], [place], DPT [phone]

## 2015-01-01 NOTE — Progress Notes (Signed)
Orthopedic Tech Progress Note Patient Details:  David [name] 01/27/1951 [id number]  CPM Left Knee CPM Left Knee: On Left Knee Flexion (Degrees): 90 Left Knee Extension (Degrees): 0 Additional Comments: trapeze bar patient helper Viewed order from doctor's order list  Hildred Priest 01/01/2015, 10:00 AM

## 2015-01-01 NOTE — Interval H&P Note (Signed)
History and Physical Interval Note:  01/01/2015 7:06 AM  David [name]  has presented today for surgery, with the diagnosis of PRIMARY LOCALIZED OA LEFT KNEE  The various methods of treatment have been discussed with the patient and family. After consideration of risks, benefits and other options for treatment, the patient has consented to  Procedure(s): UNICOMPARTMENTAL KNEE (Left) with arthroscopy with possible TOTAL KNEE REPLACEMENT LEFT as a surgical intervention .  The patient's history has been reviewed, patient examined, no change in status, stable for surgery.  I have reviewed the patient's chart and labs.  Questions were answered to the patient's satisfaction.     Elsie Saas A

## 2015-01-01 NOTE — Op Note (Signed)
MRN:     [id number] DOB/AGE:    62-Jul-1952 / 62 y.o.       OPERATIVE REPORT    DATE OF PROCEDURE:  01/01/2015       PREOPERATIVE DIAGNOSIS:   PRIMARY LOCALIZED OA  LEFT KNEE      Estimated body mass index is 27.59 kg/(m^2) as calculated from the following:   Height as of this encounter: 5' 8"  (1.727 m).   Weight as of this encounter: 82.283 kg (181 lb 6.4 oz).                                                        POSTOPERATIVE DIAGNOSIS:   SAME                                                                    PROCEDURE:  Procedure(s): ARTHROSCOPY KNEE TOTAL KNEE ARTHROPLASTY Using Depuy Attune RP implants #5 Femur, #5Tibia, 60m sigma RP bearing, 38 Patella     SURGEON: Apollos Tenbrink A    ASSISTANT:  Kirstin Shepperson PA-C   (Present and scrubbed throughout the case, critical for assistance with exposure, retraction, instrumentation, and closure.)         ANESTHESIA: Spinal with Femoral Nerve Block  DRAINS: foley, 2 medium hemovac in knee   TOURNIQUET TIME: [id number]  COMPLICATIONS:  None     SPECIMENS: None   INDICATIONS FOR PROCEDURE: The patient has  DJD LEFT KNEE, varus deformities, XR shows bone on bone arthritis. Patient has failed all conservative measures including anti-inflammatory medicines, narcotics, attempts at  exercise and weight loss, cortisone injections and viscosupplementation.  Risks and benefits of surgery have been discussed, questions answered.   DESCRIPTION OF PROCEDURE: The patient identified by armband, received  right femoral nerve block and IV antibiotics, in the holding area at [facility] Patient taken to the operating room, appropriate anesthetic  monitors were attached General endotracheal anesthesia induced with  the patient in supine position, Foley catheter was inserted. Tourniquet  applied high to the operative thigh. Lateral post and foot positioner  applied to the table, the lower extremity was then prepped and draped  in usual  sterile fashion from the ankle to the tourniquet. Time-out procedure was performed. The limb was wrapped with an Esmarch bandage and the tourniquet inflated to 365 mmHg. Left knee arthroscopy was performed first to determine if he was a candidate for a uni or total.  Medial compartment was grade IV, ACL was lax which made him not a candidate for a uni. PCL intact.  Lateral side showed grade II and III.  PF was grade III..Marland KitchenBecause of this we elected to go with the TKR.  We began the operation by making the anterior midline incision starting at handbreadth above the patella going over the patella 1 cm medial to and  4 cm distal to the tibial tubercle. Small bleeders in the skin and the  subcutaneous tissue identified and cauterized. Transverse retinaculum was incised and reflected medially and a medial parapatellar arthrotomy was accomplished. the patella was everted and theprepatellar fat pad resected.  The superficial medial collateral  ligament was then elevated from anterior to posterior along the proximal  flare of the tibia and anterior half of the menisci resected. The knee was hyperflexed exposing bone on bone arthritis. Peripheral and notch osteophytes as well as the cruciate ligaments were then resected. We continued to  work our way around posteriorly along the proximal tibia, and externally  rotated the tibia subluxing it out from underneath the femur. A McHale  retractor was placed through the notch and a lateral Hohmann retractor  placed, and we then drilled through the proximal tibia in line with the  axis of the tibia followed by an intramedullary guide rod and 2-degree  posterior slope cutting guide. The tibial cutting guide was pinned into place  allowing resection of 4 mm of bone medially and about 6 mm of bone  laterally because of her varus deformity. Satisfied with the tibial resection, we then  entered the distal femur 2 mm anterior to the PCL origin with the  intramedullary guide  rod and applied the distal femoral cutting guide  set at 62m, with 5 degrees of valgus. This was pinned along the  epicondylar axis. At this point, the distal femoral cut was accomplished without difficulty. We then sized for a #5 femoral component and pinned the guide in 3 degrees of external rotation.The chamfer cutting guide was pinned into place. The anterior, posterior, and chamfer cuts were accomplished without difficulty followed by  the Attune RP box cutting guide and the box cut. We also removed posterior osteophytes from the posterior femoral condyles. At this  time, the knee was brought into full extension. We checked our  extension and flexion gaps and found them symmetric at 62m  The patella thickness measured at 25 mm. We set the cutting guide at 15 and removed the posterior 9.5-10 mm  of the patella sized for 38 button and drilled the lollipop. The knee  was then once again hyperflexed exposing the proximal tibia. We sized for a #5 tibial base plate, applied the smokestack and the conical reamer followed by the the Delta fin keel punch. We then hammered into place the Attune RP trial femoral component, inserted a 10-mm trial bearing, trial patellar button, and took the knee through range of motion from 0-130 degrees. No thumb pressure was required for patellar  tracking. At this point, all trial components were removed, a double batch of DePuy HV cement with 1500 mg of Zinacef was mixed and applied to all bony metallic mating surfaces except for the posterior condyles of the femur itself. In order, we  hammered into place the tibial tray and removed excess cement, the femoral component and removed excess cement, a 10-mm Attune RP bearing  was inserted, and the knee brought to full extension with compression.  The patellar button was clamped into place, and excess cement  removed. While the cement cured the wound was irrigated out with normal saline solution pulse lavage, and medium  Hemovac drains were placed.. Ligament stability and patellar tracking were checked and found to be excellent. The tourniquet was then released and hemostasis was obtained with cautery. The parapatellar arthrotomy was closed with  #1 ethibond suture. The subcutaneous tissue with 0 and 2-0 undyed  Vicryl suture, and 4-0 Monocryl.. A dressing of Xeroform,  4 x 4, dressing sponges, Webril, and Ace wrap applied. Needle and sponge count were correct times 2.The patient awakened, extubated, and taken to recovery room without difficulty. Vascular status was normal, pulses  2+ and symmetric.   David [name] A 01/01/2015, 9:05 AM

## 2015-01-01 NOTE — Anesthesia Procedure Notes (Signed)
Anesthesia Regional Block:  Adductor canal block  Pre-Anesthetic Checklist: ,, timeout performed, Correct Patient, Correct Site, Correct Laterality, Correct Procedure, Correct Position, site marked, Risks and benefits discussed, Surgical consent,  Pre-op evaluation,  Post-op pain management  Laterality: Left  Prep: chloraprep       Needles:  Injection technique: Single-shot  Needle Type: Stimiplex     Needle Length: 9cm 9 cm Needle Gauge: 21 and 21 G    Additional Needles:  Procedures: ultrasound guided (picture in chart) Adductor canal block Narrative:  Injection made incrementally with aspirations every 5 mL.  Performed by: Personally  Anesthesiologist: Nolon Nations  Additional Notes: BP cuff, EKG monitors applied. Sedation begun. Artery and nerve location verified with U/S and anesthetic injected incrementally, slowly, and after negative aspirations under direct u/s guidance. Good fascial /perineural spread. Tolerated well.   Spinal Patient location during procedure: OR Staffing Anesthesiologist: Nolon Nations Performed by: anesthesiologist  Preanesthetic Checklist Completed: patient identified, site marked, surgical consent, pre-op evaluation, timeout performed, IV checked, risks and benefits discussed and monitors and equipment checked Spinal Block Patient position: sitting Prep: ChloraPrep Patient monitoring: heart rate, continuous pulse ox and blood pressure Approach: right paramedian Location: L3-4 Injection technique: single-shot Needle Needle type: Sprotte  Needle gauge: 24 G Needle length: 9 cm Additional Notes Expiration date of kit checked and confirmed. Patient tolerated procedure well, without complications.

## 2015-01-01 NOTE — Consult Note (Addendum)
WOC ostomy consult note Requested to assist patient with his ileostomy.  He states it has been present for 35 years and he does not need any assistance with pouch application or emptying. He brought his own supplies to the hospital and denies any further questions. Please re-consult if further assistance is needed.  Thank-you,  Julien Girt MSN, [place], [place], [place], [place]

## 2015-01-02 ENCOUNTER — Encounter (HOSPITAL_COMMUNITY): Payer: Self-pay | Admitting: Orthopedic Surgery

## 2015-01-02 ENCOUNTER — Inpatient Hospital Stay (HOSPITAL_COMMUNITY): Payer: BLUE CROSS/BLUE SHIELD

## 2015-01-02 LAB — CBC WITH DIFFERENTIAL/PLATELET
BASOS PCT: 0 %
Basophils Absolute: 0 10*3/uL (ref 0.0–0.1)
EOS PCT: 0 %
Eosinophils Absolute: 0 10*3/uL (ref 0.0–0.7)
HCT: 33.6 % — ABNORMAL LOW (ref 39.0–52.0)
HEMOGLOBIN: 11.5 g/dL — AB (ref 13.0–17.0)
LYMPHS PCT: 4 %
Lymphs Abs: 1.1 10*3/uL (ref 0.7–4.0)
MCH: 30.5 pg (ref 26.0–34.0)
MCHC: 34.2 g/dL (ref 30.0–36.0)
MCV: 89.1 fL (ref 78.0–100.0)
Monocytes Absolute: 0.8 10*3/uL (ref 0.1–1.0)
Monocytes Relative: 3 %
NEUTROS PCT: 93 %
Neutro Abs: 24.7 10*3/uL — ABNORMAL HIGH (ref 1.7–7.7)
PLATELETS: 249 10*3/uL (ref 150–400)
RBC: 3.77 MIL/uL — AB (ref 4.22–5.81)
RDW: 12.4 % (ref 11.5–15.5)
WBC: 26.6 10*3/uL — AB (ref 4.0–10.5)

## 2015-01-02 LAB — URINALYSIS, ROUTINE W REFLEX MICROSCOPIC
Bilirubin Urine: NEGATIVE
GLUCOSE, UA: NEGATIVE mg/dL
KETONES UR: NEGATIVE mg/dL
LEUKOCYTES UA: NEGATIVE
NITRITE: NEGATIVE
PROTEIN: NEGATIVE mg/dL
Specific Gravity, Urine: 1.018 (ref 1.005–1.030)
Urobilinogen, UA: 0.2 mg/dL (ref 0.0–1.0)
pH: 6 (ref 5.0–8.0)

## 2015-01-02 LAB — BASIC METABOLIC PANEL
ANION GAP: 7 (ref 5–15)
BUN: 14 mg/dL (ref 6–20)
CHLORIDE: 105 mmol/L (ref 101–111)
CO2: 26 mmol/L (ref 22–32)
Calcium: 8.9 mg/dL (ref 8.9–10.3)
Creatinine, Ser: 1.09 mg/dL (ref 0.61–1.24)
GFR calc non Af Amer: >60 mL/min (ref >60)
Glucose, Bld: 143 mg/dL — ABNORMAL HIGH (ref 65–99)
POTASSIUM: 4.6 mmol/L (ref 3.5–5.1)
SODIUM: 138 mmol/L (ref 135–145)

## 2015-01-02 LAB — URINE MICROSCOPIC-ADD ON

## 2015-01-02 LAB — CBC
HEMATOCRIT: 34.9 % — AB (ref 39.0–52.0)
HEMOGLOBIN: 12.1 g/dL — AB (ref 13.0–17.0)
MCH: 30.8 pg (ref 26.0–34.0)
MCHC: 34.7 g/dL (ref 30.0–36.0)
MCV: 88.8 fL (ref 78.0–100.0)
Platelets: 247 10*3/uL (ref 150–400)
RBC: 3.93 MIL/uL — AB (ref 4.22–5.81)
RDW: 12.3 % (ref 11.5–15.5)
WBC: 26.2 10*3/uL — AB (ref 4.0–10.5)

## 2015-01-02 MED ORDER — OXYCODONE HCL ER 10 MG PO T12A
20.0000 mg | EXTENDED_RELEASE_TABLET | Freq: Two times a day (BID) | ORAL | Status: DC
  Administered 2015-01-02 – 2015-01-04 (×5): 20 mg via ORAL
  Filled 2015-01-02 (×5): qty 2

## 2015-01-02 NOTE — Care Management Note (Signed)
Case Management Note  Patient Details  Name: David [name] MRN: [id number] Date of Birth: 29-Aug-1950  Subjective/Objective:        S/p left total knee arthroplasty             Action/Plan: Set up with [facility] for HHPT by MD office. Spoke with patient and his wife, no change in discharge plan. [employer] to deliver rolling walker and 3N1 to patient's room in am. Patient and wife stated that patient will have assistance after discharge.    Expected Discharge Date:                  Expected Discharge Plan:  [place]  In-House Referral:  NA  Discharge planning Services  CM Consult  Post Acute Care Choice:  Durable Medical Equipment, Home Health Choice offered to:  Patient  DME Arranged:  3-N-1, Walker rolling DME Agency:  [employer]  HH Arranged:  PT HH Agency:  [place]  Status of Service:  Completed, signed off  Medicare Important Message Given:    Date Medicare IM Given:    Medicare IM give by:    Date Additional Medicare IM Given:    Additional Medicare Important Message give by:     If discussed at [place] of Stay Meetings, dates discussed:    Additional Comments:  Nila Nephew, RN 01/02/2015, 3:53 PM

## 2015-01-02 NOTE — Progress Notes (Signed)
Subjective: 1 Day Post-Op Procedure(s) (LRB): ARTHROSCOPY KNEE (Left) TOTAL KNEE ARTHROPLASTY (Left) Patient reports pain as 5 on 0-10 scale.    Objective: Vital signs in last 24 hours: Temp:  [96.4 F (35.8 C)-98.4 F (36.9 C)] 98.4 F (36.9 C) (09/27 0504) Pulse Rate:  [61-87] 80 (09/27 0504) Resp:  [7-18] 17 (09/27 0504) BP: (88-155)/(55-89) 105/55 mmHg (09/27 0504) SpO2:  [95 %-100 %] 98 % (09/27 0504)  Intake/Output from previous day: 09/26 0701 - 09/27 0700 In: 3021.7 [P.O.:760; I.V.:2161.7; IV Piggyback:100] Out: 4514 [Urine:1600; Drains:150; Blood:75] Intake/Output this shift:     Recent Labs  01/02/15 0503  HGB 12.1*    Recent Labs  01/02/15 0503  WBC 26.2*  RBC 3.93*  HCT 34.9*  PLT 247    Recent Labs  01/02/15 0503  NA 138  K 4.6  CL 105  CO2 26  BUN 14  CREATININE 1.09  GLUCOSE 143*  CALCIUM 8.9   No results for input(s): LABPT, INR in the last 72 hours.  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  Will order chest xray to see if leukocytosis is coming from any aspiration.  Will add oxycontin to his oxycodone to manage his pain.  Will cautiously observe sedation from pain meds in the setting of his sleep apnea  SHEPPERSON,KIRSTIN J 01/02/2015, 9:06 AM

## 2015-01-02 NOTE — Evaluation (Signed)
Occupational Therapy Evaluation Patient Details Name: David [name] MRN: [id number] DOB: [birth year] Today's Date: 01/02/2015    History of Present Illness Patient is a 64 y/o male s/p L TKA. PMH includes MSSA, anxiety, ileostomy, hypothyroidism.   Clinical Impression   Pt reports he was independent with ADLs PTA. Currently pt is supervision overall for ADLs. Pt to d/c home with wife providing 24 hour supervision. Educated pt on compensatory strategies for LB ADLs, tub transfer with 3 in 1, and transfer technique with RW. Pt would benefit from continued skilled OT services in order to maximize independence with tub transfers using a 3 in 1.     Follow Up Recommendations  No OT follow up;Supervision - Intermittent    Equipment Recommendations  3 in 1 bedside comode    Recommendations for Other Services       Precautions / Restrictions Precautions Precautions: Knee Precaution Comments: Reviewed no pillow under knee and precautions. Restrictions Weight Bearing Restrictions: Yes LLE Weight Bearing: Weight bearing as tolerated      Mobility Bed Mobility Overal bed mobility: Needs Assistance Bed Mobility: Supine to Sit     Supine to sit: Supervision;HOB elevated        Transfers Overall transfer level: Needs assistance Equipment used: Rolling walker (2 wheeled) Transfers: Sit to/from Stand Sit to Stand: Supervision         General transfer comment: Supervision for safety. Stood from EOB x 2    Balance Overall balance assessment: Needs assistance         Standing balance support: Bilateral upper extremity supported Standing balance-Leahy Scale: Fair Standing balance comment: RW for support                            ADL Overall ADL's : Needs assistance/impaired Eating/Feeding: Set up;Sitting   Grooming: Supervision/safety;Standing   Upper Body Bathing: Supervision/ safety;Sitting   Lower Body Bathing: Supervison/ safety;Sit to/from  stand   Upper Body Dressing : Supervision/safety;Sitting   Lower Body Dressing: Supervision/safety;Sit to/from stand Lower Body Dressing Details (indicate cue type and reason): Pt able to don/doff B socks Toilet Transfer: Modified Independent (at bed level ) Toilet Transfer Details (indicate cue type and reason): Pt has ileostomy, he repots that a nurse has helped him to problem solve emptying his ileostomy bag. Reports that he has done this independently while in bed today. Toileting- Clothing Manipulation and Hygiene: Supervision/safety;Sit to/from stand       Functional mobility during ADLs: Supervision/safety General ADL Comments: Pt slightly impulsive with transfers. Educated pt on correct hand placement for sit <> stand with RW, pt demonstrated understanding. Educated pt and wife on compensatory strategies for LB ADLs, tub transfer technique with 3 in 1. Pt concerned about working from home, spoke to pt about proping leg up on chair underneath desk and use of zero degree knee when out of CPM.     Vision     Perception     Praxis      Pertinent Vitals/Pain Pain Assessment: Faces Faces Pain Scale: Hurts even more Pain Location: L knee Pain Descriptors / Indicators: Aching;Sore Pain Intervention(s): Limited activity within patient's tolerance;Monitored during session;Patient requesting pain meds-RN notified;Repositioned;Ice applied     Hand Dominance     Extremity/Trunk Assessment Upper Extremity Assessment Upper Extremity Assessment: Overall WFL for tasks assessed   Lower Extremity Assessment Lower Extremity Assessment: Defer to PT evaluation   Cervical / Trunk Assessment Cervical / Trunk Assessment: Normal  Communication Communication Communication: No difficulties   Cognition Arousal/Alertness: Awake/alert Behavior During Therapy: WFL for tasks assessed/performed Overall Cognitive Status: Within Functional Limits for tasks assessed                      General Comments       Exercises       Shoulder Instructions      Home Living Family/patient expects to be discharged to:: Private residence Living Arrangements: Spouse/significant other Available Help at Discharge: Family;Available 24 hours/day Type of Home: House Home Access: Stairs to enter [employer] of Steps: 2 + 1-2 Entrance Stairs-Rails: None Home Layout: One level     Bathroom Shower/Tub: Tub/shower unit Shower/tub characteristics: Architectural technologist: Standard Bathroom Accessibility: Yes How Accessible: Accessible via walker Home Equipment: Crutches          Prior Functioning/Environment Level of Independence: Independent             OT Diagnosis: Generalized weakness;Acute pain   OT Problem List: Decreased strength;Decreased activity tolerance;Impaired balance (sitting and/or standing);Decreased safety awareness;Decreased knowledge of use of DME or AE;Decreased knowledge of precautions;Pain   OT Treatment/Interventions: Self-care/ADL training;DME and/or AE instruction;Patient/family education    OT Goals(Current goals can be found in the care plan section) Acute Rehab OT Goals Patient Stated Goal: to return to independence OT Goal Formulation: With patient Time For Goal Achievement: 01/16/15 Potential to Achieve Goals: Good ADL Goals Pt Will Perform Tub/Shower Transfer: with supervision;ambulating;3 in 1;rolling walker  OT Frequency: Min 2X/week   Barriers to D/C:            Co-evaluation              End of Session Equipment Utilized During Treatment: Gait belt;Rolling walker CPM Left Knee CPM Left Knee: Off  Activity Tolerance: Patient tolerated treatment well Patient left: in chair;with call bell/phone within reach;with family/visitor present   Time: 9485-4627 OT Time Calculation (min): 33 min Charges:  OT General Charges $OT Visit: 1 Procedure OT Evaluation $Initial OT Evaluation Tier I: 1 Procedure OT  Treatments $Self Care/Home Management : 8-22 mins G-Codes:     Binnie Kand M.S., OTR/L Pager: [phone]  01/02/2015, 3:53 PM

## 2015-01-02 NOTE — Progress Notes (Signed)
Placed patient on home CPAP for the night

## 2015-01-02 NOTE — Plan of Care (Signed)
Problem: Consults Goal: Diagnosis- Total Joint Replacement Primary Total Knee     

## 2015-01-02 NOTE — Progress Notes (Signed)
Physical Therapy Treatment Patient Details Name: David [name] MRN: [id number] DOB: 09/20/1950 Today's Date: 01/02/2015    History of Present Illness Patient is a 64 y/o male s/p L TKA. PMH includes MSSA, anxiety, ileostomy, hypothyroidism.    PT Comments    Patient progressing well with mobility. Ambulating 400' with Rw and supervision for safety. Pt + nausea and emesis earlier in PM. This session focused on gait training and there ex. Will plan for stair training with spouse tomorrow prior to discharge. Will follow acutely to maximize independence and mobility prior to return home.   Follow Up Recommendations  Home health PT;Supervision/Assistance - 24 hour     Equipment Recommendations  Rolling walker with 5" wheels    Recommendations for Other Services       Precautions / Restrictions Precautions Precautions: Knee Precaution Booklet Issued: Yes (comment) Precaution Comments: Reviewed no pillow under knee and precautions. Restrictions Weight Bearing Restrictions: Yes LLE Weight Bearing: Weight bearing as tolerated    Mobility  Bed Mobility Overal bed mobility: Needs Assistance Bed Mobility: Sit to Supine      Sit to supine: Modified independent (Device/Increase time)   General bed mobility comments: HOB flat, no use of rails to simulate home.  Transfers Overall transfer level: Needs assistance Equipment used: Rolling walker (2 wheeled) Transfers: Sit to/from Stand Sit to Stand: Supervision         General transfer comment: Supervision for safety. Stood from Automotive engineer.   Ambulation/Gait Ambulation/Gait assistance: Supervision Ambulation Distance (Feet): 400 Feet Assistive device: Rolling walker (2 wheeled) Gait Pattern/deviations: Step-through pattern;Decreased stance time - left;Decreased step length - right;Antalgic;Decreased stride length   Gait velocity interpretation: <1.8 ft/sec, indicative of risk for recurrent falls General Gait Details: Cues  for knee extension during stance phase to activate quadriceps and for heel strike. Cues to relax shoulders.   Stairs            Wheelchair Mobility    Modified Rankin (Stroke Patients Only)       Balance Overall balance assessment: Needs assistance Sitting-balance support: Feet supported;No upper extremity supported Sitting balance-Leahy Scale: Good     Standing balance support: During functional activity Standing balance-Leahy Scale: Fair Standing balance comment: RW for support                    Cognition Arousal/Alertness: Awake/alert Behavior During Therapy: WFL for tasks assessed/performed Overall Cognitive Status: Within Functional Limits for tasks assessed                      Exercises Total Joint Exercises Quad Sets: Both;10 reps;Seated Towel Squeeze: Both;10 reps;Seated Hip ABduction/ADduction: Left;10 reps;Seated    General Comments General comments (skin integrity, edema, etc.): Spouse present during session.      Pertinent Vitals/Pain Pain Assessment: Faces Faces Pain Scale: Hurts a little bit Pain Location: left knee Pain Descriptors / Indicators: Sore Pain Intervention(s): Monitored during session;Repositioned;Premedicated before session;Ice applied    Home Living Family/patient expects to be discharged to:: Private residence Living Arrangements: Spouse/significant other Available Help at Discharge: Family;Available 24 hours/day Type of Home: House Home Access: Stairs to enter Entrance Stairs-Rails: None Home Layout: One level Home Equipment: Crutches      Prior Function Level of Independence: Independent          PT Goals (current goals can now be found in the care plan section) Acute Rehab PT Goals Patient Stated Goal: to return to independence Progress towards PT goals: Progressing  toward goals    Frequency  7X/week    PT Plan Current plan remains appropriate    Co-evaluation             End of  Session Equipment Utilized During Treatment: Gait belt Activity Tolerance: Patient tolerated treatment well Patient left: in bed;with call bell/phone within reach;in CPM;with family/visitor present     Time: 8563-1497 PT Time Calculation (min) (ACUTE ONLY): 22 min  Charges:  $Gait Training: 8-22 mins                    G Codes:      Shauna A Hartshorne 01/02/2015, 4:51 PM  [name], [place], DPT [phone]

## 2015-01-02 NOTE — Progress Notes (Signed)
Physical Therapy Treatment Patient Details Name: David [name] MRN: [id number] DOB: 1950-04-24 Today's Date: 01/02/2015    History of Present Illness Patient is a 64 y/o male s/p L TKA. PMH includes MSSA, anxiety, ileostomy, hypothyroidism.    PT Comments    Patient progressing well towards PT goals. Improved ambulation distance today. Provided handout on exercises. Encouraged OOB to chair for all meals. Pt plans to discharge home later today if cleared by MD. Will plan for stair training in PM session as tolerated prior to return home. Will follow acutely.   Follow Up Recommendations  Home health PT;Supervision/Assistance - 24 hour     Equipment Recommendations  Rolling walker with 5" wheels    Recommendations for Other Services       Precautions / Restrictions Precautions Precautions: Knee Precaution Booklet Issued: Yes (comment) Precaution Comments: Reviewed no pillow under knee and precautions. Restrictions Weight Bearing Restrictions: Yes LLE Weight Bearing: Weight bearing as tolerated    Mobility  Bed Mobility Overal bed mobility: Needs Assistance Bed Mobility: Sit to Supine       Sit to supine: Modified independent (Device/Increase time)   General bed mobility comments: HOB flat, no use of rails to simulate home.  Transfers Overall transfer level: Needs assistance Equipment used: Rolling walker (2 wheeled) Transfers: Sit to/from Stand Sit to Stand: Supervision         General transfer comment: Supervision for safety. Stood from Youth worker.  Ambulation/Gait Ambulation/Gait assistance: Supervision Ambulation Distance (Feet): 150 Feet Assistive device: Rolling walker (2 wheeled) Gait Pattern/deviations: Step-through pattern;Decreased stance time - left;Decreased step length - right;Decreased stride length   Gait velocity interpretation: <1.8 ft/sec, indicative of risk for recurrent falls General Gait Details: Cues for knee extension during stance  phase to activate quadriceps and for heel strike.    Stairs            Wheelchair Mobility    Modified Rankin (Stroke Patients Only)       Balance Overall balance assessment: Needs assistance Sitting-balance support: Feet supported;No upper extremity supported Sitting balance-Leahy Scale: Good     Standing balance support: During functional activity Standing balance-Leahy Scale: Fair                      Cognition Arousal/Alertness: Awake/alert Behavior During Therapy: WFL for tasks assessed/performed Overall Cognitive Status: Within Functional Limits for tasks assessed                      Exercises Total Joint Exercises Quad Sets: Both;10 reps;Supine Towel Squeeze: Both;10 reps;Seated Heel Slides: Left;10 reps;Seated Hip ABduction/ADduction: Left;10 reps;Seated Long Arc Quad: Left;10 reps;Seated Goniometric ROM: 11-85 degrees knee AROM    General Comments        Pertinent Vitals/Pain Pain Assessment: Faces Faces Pain Scale: Hurts little more Pain Location: left knee Pain Descriptors / Indicators: Sore;Dull Pain Intervention(s): Monitored during session;Repositioned;Premedicated before session    Home Living                      Prior Function            PT Goals (current goals can now be found in the care plan section) Progress towards PT goals: Progressing toward goals    Frequency  7X/week    PT Plan Current plan remains appropriate    Co-evaluation             End of Session Equipment Utilized During Treatment: Gait  belt Activity Tolerance: Patient tolerated treatment well Patient left: in bed;with call bell/phone within reach     Time: 0857-0924 PT Time Calculation (min) (ACUTE ONLY): 27 min  Charges:  $Gait Training: 8-22 mins $Therapeutic Exercise: 8-22 mins                    G Codes:      Jamaira Sherk A Kross Swallows 01/02/2015, 11:33 AM [name], PT, DPT [phone]

## 2015-01-03 LAB — BASIC METABOLIC PANEL
Anion gap: 7 (ref 5–15)
BUN: 15 mg/dL (ref 6–20)
CO2: 25 mmol/L (ref 22–32)
Calcium: 8.5 mg/dL — ABNORMAL LOW (ref 8.9–10.3)
Chloride: 104 mmol/L (ref 101–111)
Creatinine, Ser: 1.01 mg/dL (ref 0.61–1.24)
Glucose, Bld: 148 mg/dL — ABNORMAL HIGH (ref 65–99)
POTASSIUM: 4.5 mmol/L (ref 3.5–5.1)
SODIUM: 136 mmol/L (ref 135–145)

## 2015-01-03 LAB — CBC
HCT: 31.9 % — ABNORMAL LOW (ref 39.0–52.0)
Hemoglobin: 10.4 g/dL — ABNORMAL LOW (ref 13.0–17.0)
MCH: 29.8 pg (ref 26.0–34.0)
MCHC: 32.6 g/dL (ref 30.0–36.0)
MCV: 91.4 fL (ref 78.0–100.0)
PLATELETS: 239 10*3/uL (ref 150–400)
RBC: 3.49 MIL/uL — AB (ref 4.22–5.81)
RDW: 12.9 % (ref 11.5–15.5)
WBC: 25.2 10*3/uL — AB (ref 4.0–10.5)

## 2015-01-03 LAB — URINE CULTURE: CULTURE: NO GROWTH

## 2015-01-03 MED ORDER — DEXTROSE 5 % IV SOLN
2.0000 g | INTRAVENOUS | Status: DC
  Administered 2015-01-03 – 2015-01-04 (×2): 2 g via INTRAVENOUS
  Filled 2015-01-03 (×2): qty 2

## 2015-01-03 NOTE — Progress Notes (Signed)
Subjective: 2 Days Post-Op Procedure(s) (LRB): ARTHROSCOPY KNEE (Left) TOTAL KNEE ARTHROPLASTY (Left) Patient reports pain as 4 on 0-10 scale.    Objective: Vital signs in last 24 hours: Temp:  [97.7 F (36.5 C)-98.3 F (36.8 C)] 97.9 F (36.6 C) (09/28 0548) Pulse Rate:  [73-86] 86 (09/28 0548) Resp:  [16] 16 (09/28 0548) BP: (126-138)/(65-74) 132/65 mmHg (09/28 0548) SpO2:  [98 %-100 %] 98 % (09/28 0548)  Intake/Output from previous day: 09/27 0701 - 09/28 0700 In: 1440 [P.O.:540; I.V.:900] Out: 450 [Urine:450] Intake/Output this shift:     Recent Labs  01/02/15 0503 01/02/15 0823 01/03/15 0615  HGB 12.1* 11.5* 10.4*    Recent Labs  01/02/15 0823 01/03/15 0615  WBC 26.6* 25.2*  RBC 3.77* 3.49*  HCT 33.6* 31.9*  PLT 249 239    Recent Labs  01/02/15 0503 01/03/15 0615  NA 138 136  K 4.6 4.5  CL 105 104  CO2 26 25  BUN 14 15  CREATININE 1.09 1.01  GLUCOSE 143* 148*  CALCIUM 8.9 8.5*   No results for input(s): LABPT, INR in the last 72 hours.  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

## 2015-01-03 NOTE — Progress Notes (Signed)
Physical Therapy Treatment Patient Details Name: David [name] MRN: [id number] DOB: 1950-06-14 Today's Date: 01/03/2015    History of Present Illness Patient is a 64 y/o male s/p L TKA. PMH includes MSSA, anxiety, ileostomy, hypothyroidism.    PT Comments    Patient progressing well towards PT goals. Performed gait training holding onto handrail for support. Continue to recommend use of RW while in hospital for safety and to maintain normalized gait pattern. Pt ambulating Mod I with RW however Min guard with rail. Discussed car transfer technique. Will need to perform family training on stairs tomorrow. Pt independent with HEP. Will follow acutely per current POC.   Follow Up Recommendations  Home health PT;Supervision/Assistance - 24 hour     Equipment Recommendations  Rolling walker with 5" wheels    Recommendations for Other Services       Precautions / Restrictions Precautions Precautions: Knee Precaution Booklet Issued: Yes (comment) Precaution Comments: Reviewed no pillow under knee and precautions. Restrictions Weight Bearing Restrictions: Yes LLE Weight Bearing: Weight bearing as tolerated    Mobility  Bed Mobility               General bed mobility comments: Sitting on couch upon PT arrival.   Transfers Overall transfer level: Needs assistance Equipment used: Rolling walker (2 wheeled) Transfers: Sit to/from Stand Sit to Stand: Modified independent (Device/Increase time)            Ambulation/Gait Ambulation/Gait assistance: Modified independent (Device/Increase time);Min guard Ambulation Distance (Feet): 250 Feet Assistive device: Rolling walker (2 wheeled);None Gait Pattern/deviations: Step-through pattern;Decreased stance time - left;Decreased step length - right;Antalgic   Gait velocity interpretation: <1.8 ft/sec, indicative of risk for recurrent falls General Gait Details: Cues for normalized gait pattern- knee extension during stance  phase and to initiate heel strike. Ambulated with RW and without- holding onto handrail for support.    Stairs Stairs: Yes Stairs assistance: Min assist Stair Management: Backwards;With walker Number of Stairs: 2 General stair comments: Cues for technique/safety. Min A to stabilize RW. Will need to review with wife tomorrow prior to d/c.  Wheelchair Mobility    Modified Rankin (Stroke Patients Only)       Balance Overall balance assessment: Needs assistance Sitting-balance support: Feet supported;No upper extremity supported Sitting balance-Leahy Scale: Good     Standing balance support: During functional activity Standing balance-Leahy Scale: Good                      Cognition Arousal/Alertness: Awake/alert Behavior During Therapy: WFL for tasks assessed/performed Overall Cognitive Status: Within Functional Limits for tasks assessed                      Exercises      General Comments        Pertinent Vitals/Pain Pain Assessment: 0-10 Pain Score: 1  Pain Location: left quad Pain Descriptors / Indicators: Dull Pain Intervention(s): Monitored during session;Repositioned;Ice applied    Home Living                      Prior Function            PT Goals (current goals can now be found in the care plan section) Progress towards PT goals: Progressing toward goals    Frequency  7X/week    PT Plan Current plan remains appropriate    Co-evaluation             End of Session  Equipment Utilized During Treatment: Gait belt Activity Tolerance: Patient tolerated treatment well Patient left: in chair;with call bell/phone within reach;Other (comment) (in zero degree knee.)     Time: 3709-6438 PT Time Calculation (min) (ACUTE ONLY): 20 min  Charges:  $Gait Training: 8-22 mins                    G Codes:      David [name] A David [name] 01/03/2015, 2:19 PM  [name], [place], DPT [phone]

## 2015-01-03 NOTE — Progress Notes (Signed)
Physical Therapy Treatment Patient Details Name: David [name] MRN: [id number] DOB: Nov 13, 1950 Today's Date: 01/03/2015    History of Present Illness Patient is a 64 y/o male s/p L TKA. PMH includes MSSA, anxiety, ileostomy, hypothyroidism.    PT Comments    Patient progressing well with mobility. Improved ambulation distance and gait mechanics with cues but reports fatigue in quadriceps with increased distance. Reviewed rest of handout and exercises. Will plan for stair training in PM as tolerated. Will follow acutely per current POC.   Follow Up Recommendations  Home health PT;Supervision/Assistance - 24 hour     Equipment Recommendations  Rolling walker with 5" wheels    Recommendations for Other Services       Precautions / Restrictions Precautions Precautions: Knee Precaution Booklet Issued: Yes (comment) Precaution Comments: Reviewed no pillow under knee and precautions. Restrictions Weight Bearing Restrictions: Yes LLE Weight Bearing: Weight bearing as tolerated    Mobility  Bed Mobility               General bed mobility comments: Standing by bed upon PT arrival.  Transfers Overall transfer level: Needs assistance Equipment used: Rolling walker (2 wheeled) Transfers: Sit to/from Stand Sit to Stand: Modified independent (Device/Increase time)         General transfer comment: Stood from chair x1.   Ambulation/Gait Ambulation/Gait assistance: Modified independent (Device/Increase time) Ambulation Distance (Feet): 500 Feet Assistive device: Rolling walker (2 wheeled) Gait Pattern/deviations: Step-through pattern;Decreased stance time - left;Decreased step length - right;Antalgic;Decreased stride length   Gait velocity interpretation: <1.8 ft/sec, indicative of risk for recurrent falls General Gait Details: Cues for knee extension during stance phase to activate quadriceps and for heel strike.   Stairs            Wheelchair Mobility     Modified Rankin (Stroke Patients Only)       Balance Overall balance assessment: Needs assistance Sitting-balance support: Feet supported;No upper extremity supported Sitting balance-Leahy Scale: Good     Standing balance support: During functional activity Standing balance-Leahy Scale: Fair                      Cognition Arousal/Alertness: Awake/alert Behavior During Therapy: WFL for tasks assessed/performed Overall Cognitive Status: Within Functional Limits for tasks assessed                      Exercises Total Joint Exercises Heel Slides: Left;10 reps;Seated Straight Leg Raises: Left;10 reps;Seated Goniometric ROM: 4- 90 degrees knee AROM    General Comments        Pertinent Vitals/Pain Pain Assessment: 0-10 Pain Score: 1  Pain Location: left quad Pain Descriptors / Indicators: Sore Pain Intervention(s): Monitored during session;Repositioned    Home Living                      Prior Function            PT Goals (current goals can now be found in the care plan section) Progress towards PT goals: Progressing toward goals    Frequency  7X/week    PT Plan Current plan remains appropriate    Co-evaluation             End of Session Equipment Utilized During Treatment: Gait belt Activity Tolerance: Patient tolerated treatment well Patient left: in chair;with call bell/phone within reach     Time: 0913-0936 PT Time Calculation (min) (ACUTE ONLY): 23 min  Charges:  $Gait  Training: 8-22 mins $Therapeutic Exercise: 8-22 mins                    G Codes:      Shauna A Hartshorne 01/03/2015, 9:43 AM [name], PT, DPT [phone]

## 2015-01-03 NOTE — Progress Notes (Signed)
OT Cancellation Note  Patient Details Name: David [name] MRN: [id number] DOB: Aug 11, 1950   Cancelled Treatment:    Reason Eval/Treat Not Completed: Patient declined, no reason specified. Pt just got back to bed, feeling tired. Wife not present for tub transfer education, pt would like to wait until wife is present. Will check back as time allows and appropriate.   Binnie Kand M.S., OTR/L Pager: [phone]  01/03/2015, 2:54 PM

## 2015-01-03 NOTE — Progress Notes (Signed)
OT Cancellation Note  Patient Details Name: DONTAY HARM MRN: [id number] DOB: Oct 29, 1950   Cancelled Treatment:    Reason Eval/Treat Not Completed: Patient declined, no reason specified (Pt just finished with PT, would like to rest for a little). Pt not d/c until tomorrow. Will check back later as time allows.   Binnie Kand M.S., OTR/L Pager: [phone]  01/03/2015, 9:57 AM

## 2015-01-04 LAB — BASIC METABOLIC PANEL
ANION GAP: 7 (ref 5–15)
BUN: 19 mg/dL (ref 6–20)
CALCIUM: 8.7 mg/dL — AB (ref 8.9–10.3)
CO2: 27 mmol/L (ref 22–32)
CREATININE: 1.01 mg/dL (ref 0.61–1.24)
Chloride: 106 mmol/L (ref 101–111)
GFR calc Af Amer: >60 mL/min (ref >60)
GLUCOSE: 146 mg/dL — AB (ref 65–99)
Potassium: 3.7 mmol/L (ref 3.5–5.1)
Sodium: 140 mmol/L (ref 135–145)

## 2015-01-04 LAB — CBC
HCT: 29.7 % — ABNORMAL LOW (ref 39.0–52.0)
Hemoglobin: 9.9 g/dL — ABNORMAL LOW (ref 13.0–17.0)
MCH: 30.3 pg (ref 26.0–34.0)
MCHC: 33.3 g/dL (ref 30.0–36.0)
MCV: 90.8 fL (ref 78.0–100.0)
PLATELETS: 219 10*3/uL (ref 150–400)
RBC: 3.27 MIL/uL — ABNORMAL LOW (ref 4.22–5.81)
RDW: 12.7 % (ref 11.5–15.5)
WBC: 20.5 10*3/uL — AB (ref 4.0–10.5)

## 2015-01-04 MED ORDER — CELECOXIB 200 MG PO CAPS: ORAL_CAPSULE | ORAL | Status: DC

## 2015-01-04 MED ORDER — OXYCODONE HCL 5 MG PO TABS: ORAL_TABLET | ORAL | Status: DC

## 2015-01-04 MED ORDER — DOCUSATE SODIUM 100 MG PO CAPS: ORAL_CAPSULE | ORAL | Status: DC

## 2015-01-04 MED ORDER — POLYETHYLENE GLYCOL 3350 17 G PO PACK: PACK | ORAL | Status: DC

## 2015-01-04 MED ORDER — OXYCODONE HCL ER 20 MG PO T12A: 20.0000 mg | EXTENDED_RELEASE_TABLET | Freq: Two times a day (BID) | ORAL | Status: DC

## 2015-01-04 MED ORDER — ASPIRIN 325 MG PO TBEC: DELAYED_RELEASE_TABLET | ORAL | Status: DC

## 2015-01-04 MED ORDER — CEFUROXIME AXETIL 500 MG PO TABS: 500.0000 mg | ORAL_TABLET | Freq: Two times a day (BID) | ORAL | Status: DC

## 2015-01-04 NOTE — Discharge Summary (Signed)
Patient ID: David [name] MRN: [id number] DOB/AGE: Sep 01, 1950 64 y.o.  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 Thyroid disease   . Ulcerative colitis   . History of kidney stones   . Primary localized osteoarthritis of left knee   . Sleep apnea   . Asthma     as a child   . Hypothyroidism   . Anxiety   . GERD (gastroesophageal reflux disease)   . Cancer     kidney cancer  . 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 01/01/2015    Surgeries: Procedure(s): ARTHROSCOPY KNEE TOTAL KNEE ARTHROPLASTY on 01/01/2015   Consultants:    Discharged Condition: Improved  Hospital Course: DAROLD MILEY is an 64 y.o. male who was admitted 01/01/2015 for operative treatment ofPrimary localized osteoarthritis of left knee. Patient has severe unremitting pain that affects sleep, daily activities, and work/hobbies. After pre-op clearance the patient was taken to the operating room on 01/01/2015 and underwent  Procedure(s): ARTHROSCOPY KNEE TOTAL KNEE ARTHROPLASTY.    Patient was given perioperative antibiotics: Anti-infectives    Start     Dose/Rate Route Frequency Ordered Stop   01/04/15 0000  cefUROXime (CEFTIN) 500 MG tablet     500 mg Oral 2 times daily with meals 01/04/15 0855     01/03/15 0900  cefTRIAXone (ROCEPHIN) 2 g in dextrose 5 % 50 mL IVPB     2 g 100 mL/hr over 30 Minutes Intravenous Every 24 hours 01/03/15 0805     01/01/15 1115  ceFAZolin (ANCEF) IVPB 2 g/50 mL premix     2 g 100 mL/hr over 30 Minutes Intravenous Every 6 hours 01/01/15 1110 01/01/15 1815   01/01/15 0835  cefUROXime (ZINACEF) injection   Status:  Discontinued       As needed 01/01/15 0835 01/01/15 0936   01/01/15 0544  ceFAZolin (ANCEF) IVPB 2 g/50 mL premix     2 g 100 mL/hr over 30 Minutes Intravenous On call to O.R. 01/01/15 0544 01/01/15 0735   01/01/15 0523  ceFAZolin (ANCEF) 2-3 GM-% IVPB SOLR    Comments:  Sharmaine Base   : cabinet override      01/01/15 0523 01/01/15 1729       Patient was given sequential compression devices, early ambulation, and chemoprophylaxis to prevent DVT.  Patient benefited maximally from hospital stay and there were no complications.    Recent vital signs: Patient Vitals for the past 24 hrs:  BP Temp Pulse Resp SpO2  01/04/15 0558 119/70 mmHg 98.4 F (36.9 C) 61 18 98 %  01/03/15 2034 (!) 141/90 mmHg 98.3 F (36.8 C) 75 18 98 %  01/03/15 1400 129/72 mmHg 98.5 F (36.9 C) 75 18 98 %     Recent laboratory studies:  Recent Labs  01/03/15 0615 01/04/15 0330  WBC 25.2* 20.5*  HGB 10.4* 9.9*  HCT 31.9* 29.7*  PLT 239 219  NA 136 140  K 4.5 3.7  CL 104 106  CO2 25 27  BUN 15 19  CREATININE 1.01 1.01  GLUCOSE 148* 146*  CALCIUM 8.5* 8.7*     Discharge Medications:     Medication List  STOP taking these medications        GLUCOSAMINE CHONDR COMPLEX PO     ibuprofen 200 MG tablet  Commonly known as:  ADVIL,MOTRIN      TAKE these medications        ALPRAZolam 0.25 MG tablet  Commonly known as:  XANAX  Take 0.25 mg by mouth 3 (three) times daily as needed for anxiety.     aspirin 325 MG EC tablet  1 tab a day for the next 30 days to prevent blood clots     cefUROXime 500 MG tablet  Commonly known as:  CEFTIN  Take 1 tablet (500 mg total) by mouth 2 (two) times daily with a meal.     celecoxib 200 MG capsule  Commonly known as:  CELEBREX  1 tab po q day with food for pain and  swelling     docusate sodium 100 MG capsule  Commonly known as:  COLACE  1 tab 2 times a day while on narcotics.  STOOL SOFTENER     DULoxetine 30 MG capsule  Commonly  known as:  CYMBALTA  Take 30 mg by mouth at bedtime.     Fish Oil 1000 MG Caps  Take 1 capsule by mouth 2 (two) times daily.     Fluticasone-Salmeterol 250-50 MCG/DOSE Aepb  Commonly known as:  ADVAIR  Inhale 1 puff into the lungs 2 (two) times daily.     levothyroxine 175 MCG tablet  Commonly known as:  SYNTHROID, LEVOTHROID  Take 175 mcg by mouth daily before breakfast.     multivitamin with minerals Tabs tablet  Take 1 tablet by mouth daily.     omeprazole 20 MG tablet  Commonly known as:  PRILOSEC OTC  Take 20 mg by mouth daily.     oxyCODONE 5 MG immediate release tablet  Commonly known as:  Oxy IR/ROXICODONE  1-2 tablets every 4-6 hrs as needed for pain     OxyCODONE 20 mg T12a 12 hr tablet  Commonly known as:  OXYCONTIN  Take 1 tablet (20 mg total) by mouth every 12 (twelve) hours.     polyethylene glycol packet  Commonly known as:  MIRALAX / GLYCOLAX  17grams in 16 oz of water twice a day until bowel movement.  LAXITIVE.  Restart if two days since last bowel movement     VISINE OP  Place 1 drop into both eyes as needed (for dry eyes).        Diagnostic Studies: Dg Chest 2 View  01/02/2015   CLINICAL DATA:  Leukocytosis.  EXAM: CHEST  2 VIEW  COMPARISON:  April 04, 2014.  FINDINGS: The heart size and mediastinal contours are within normal limits. Both lungs are clear. No pneumothorax or pleural effusion is noted. The visualized skeletal structures are unremarkable.  IMPRESSION: No active cardiopulmonary disease.   Electronically Signed   By: Marijo Conception, M.D.   On: 01/02/2015 10:20    Disposition: 01-Home or Self Care      Discharge Instructions    CPM    Complete by:  As directed   Continuous passive motion machine (CPM):      Use the CPM from 0 to 90 for 6 hours per day.       You may break it up into 2 or 3 sessions per day.      Use CPM for 2 weeks or until you are told to stop.     Call MD / Call 911  Complete by:  As directed   If you  experience chest pain or shortness of breath, CALL 911 and be transported to the hospital emergency room.  If you develope a fever above 101 F, pus (white drainage) or increased drainage or redness at the wound, or calf pain, call your surgeon's office.     Change dressing    Complete by:  As directed   Change the gauze dressing daily with sterile 4 x 4 inch gauze and apply TED hose.  DO NOT REMOVE BANDAGE OVER SURGICAL INCISION.  [place] WHOLE LEG INCLUDING OVER THE WATERPROOF BANDAGE WITH SOAP AND WATER EVERY DAY.     Constipation Prevention    Complete by:  As directed   Drink plenty of fluids.  Prune juice may be helpful.  You may use a stool softener, such as Colace (over the counter) 100 mg twice a day.  Use MiraLax (over the counter) for constipation as needed.     Diet - low sodium heart healthy    Complete by:  As directed      Discharge instructions    Complete by:  As directed   INSTRUCTIONS AFTER JOINT REPLACEMENT   Remove items at home which could result in a fall. This includes throw rugs or furniture in walking pathways ICE to the affected joint every three hours while awake for 30 minutes at a time, for at least the first 3-5 days, and then as needed for pain and swelling.  Continue to use ice for pain and swelling. You may notice swelling that will progress down to the foot and ankle.  This is normal after surgery.  Elevate your leg when you are not up walking on it.   Continue to use the breathing machine you got in the hospital (incentive spirometer) which will help keep your temperature down.  It is common for your temperature to cycle up and down following surgery, especially at night when you are not up moving around and exerting yourself.  The breathing machine keeps your lungs expanded and your temperature down.   DIET:  As you were doing prior to hospitalization, we recommend a well-balanced diet.  DRESSING / WOUND CARE / SHOWERING  Keep the surgical dressing until follow  up.  The dressing is water proof, so you can shower without any extra covering.  IF THE DRESSING FALLS OFF or the wound gets wet inside, change the dressing with sterile gauze.  Please use good hand washing techniques before changing the dressing.  Do not use any lotions or creams on the incision until instructed by your surgeon.    ACTIVITY  Increase activity slowly as tolerated, but follow the weight bearing instructions below.   No driving for 6 weeks or until further direction given by your physician.  You cannot drive while taking narcotics.  No lifting or carrying greater than 10 lbs. until further directed by your surgeon. Avoid periods of inactivity such as sitting longer than an hour when not asleep. This helps prevent blood clots.  You may return to work once you are authorized by your doctor.     WEIGHT BEARING   Weight bearing as tolerated with assist device (walker, cane, etc) as directed, use it as long as suggested by your surgeon or therapist, typically at least 1-2 weeks.   EXERCISES  Results after joint replacement surgery are often greatly improved when you follow the exercise, range of motion and muscle strengthening exercises prescribed by your doctor. Safety measures are  also important to protect the joint from further injury. Any time any of these exercises cause you to have increased pain or swelling, decrease what you are doing until you are comfortable again and then slowly increase them. If you have problems or questions, call your caregiver or physical therapist for advice.   Rehabilitation is important following a joint replacement. After just a few days of immobilization, the muscles of the leg can become weakened and shrink (atrophy).  These exercises are designed to build up the tone and strength of the thigh and leg muscles and to improve motion. Often times heat used for twenty to thirty minutes before working out will loosen up your tissues and help with  improving the range of motion but do not use heat for the first two weeks following surgery (sometimes heat can increase post-operative swelling).   These exercises can be done on a training (exercise) mat, on the floor, on a table or on a bed. Use whatever works the best and is most comfortable for you.    Use music or television while you are exercising so that the exercises are a pleasant break in your day. This will make your life better with the exercises acting as a break in your routine that you can look forward to.   Perform all exercises about fifteen times, three times per day or as directed.  You should exercise both the operative leg and the other leg as well.   Exercises include:   Quad Sets - Tighten up the muscle on the front of the thigh (Quad) and hold for 5-10 seconds.   Straight Leg Raises - With your knee straight (if you were given a brace, keep it on), lift the leg to 60 degrees, hold for 3 seconds, and slowly lower the leg.  Perform this exercise against resistance later as your leg gets stronger.  Leg Slides: Lying on your back, slowly slide your foot toward your buttocks, bending your knee up off the floor (only go as far as is comfortable). Then slowly slide your foot back down until your leg is flat on the floor again.  Angel Wings: Lying on your back spread your legs to the side as far apart as you can without causing discomfort.  Hamstring Strength:  Lying on your back, push your heel against the floor with your leg straight by tightening up the muscles of your buttocks.  Repeat, but this time bend your knee to a comfortable angle, and push your heel against the floor.  You may put a pillow under the heel to make it more comfortable if necessary.   A rehabilitation program following joint replacement surgery can speed recovery and prevent re-injury in the future due to weakened muscles. Contact your doctor or a physical therapist for more information on knee rehabilitation.     CONSTIPATION  Constipation is defined medically as fewer than three stools per week and severe constipation as less than one stool per week.  Even if you have a regular bowel pattern at home, your normal regimen is likely to be disrupted due to multiple reasons following surgery.  Combination of anesthesia, postoperative narcotics, change in appetite and fluid intake all can affect your bowels.   YOU MUST use at least one of the following options; they are listed in order of increasing strength to get the job done.  They are all available over the counter, and you may need to use some, POSSIBLY even all of these options:  Drink plenty of fluids (prune juice may be helpful) and high fiber foods Colace 100 mg by mouth twice a day  Senokot for constipation as directed and as needed Dulcolax (bisacodyl), take with full glass of water  Miralax (polyethylene glycol) once or twice a day as needed.  If you have tried all these things and are unable to have a bowel movement in the first 3-4 days after surgery call either your surgeon or your primary doctor.    If you experience loose stools or diarrhea, hold the medications until you stool forms back up.  If your symptoms do not get better within 1 week or if they get worse, check with your doctor.  If you experience "the worst abdominal pain ever" or develop nausea or vomiting, please contact the office immediately for further recommendations for treatment.   ITCHING:  If you experience itching with your medications, try taking only a single pain pill, or even half a pain pill at a time.  You can also use Benadryl over the counter for itching or also to help with sleep.   TED HOSE STOCKINGS:  Use stockings on both legs until for at least 2 weeks or as directed by physician office. They may be removed at night for sleeping.  MEDICATIONS:  See your medication summary on the "After Visit Summary" that nursing will review with you.  You may have some  home medications which will be placed on hold until you complete the course of blood thinner medication.  It is important for you to complete the blood thinner medication as prescribed.  PRECAUTIONS:  If you experience chest pain or shortness of breath - call 911 immediately for transfer to the hospital emergency department.   If you develop a fever greater that 101 F, purulent drainage from wound, increased redness or drainage from wound, foul odor from the wound/dressing, or calf pain - CONTACT YOUR SURGEON.                                                   FOLLOW-UP APPOINTMENTS:  If you do not already have a post-op appointment, please call the office for an appointment to be seen by your surgeon.  Guidelines for how soon to be seen are listed in your "After Visit Summary", but are typically between 1-4 weeks after surgery.  OTHER INSTRUCTIONS:   Knee Replacement:  Do not place pillow under knee, focus on keeping the knee straight while resting. CPM instructions: 0-90 degrees, 2 hours in the morning, 2 hours in the afternoon, and 2 hours in the evening. Place foam block, curve side up under heel at all times except when in CPM or when walking.  DO NOT modify, tear, cut, or change the foam block in any way.  MAKE SURE YOU:  Understand these instructions.  Get help right away if you are not doing well or get worse.    Thank you for letting us be a part of your medical care team.  It is a privilege we respect greatly.  We hope these instructions will help you stay on track for a fast and full recovery!     Do not put a pillow under the knee. Place it under the heel.    Complete by:  As directed   Place gray foam block, curve side up under heel  at all times except when in CPM or when walking.  DO NOT modify, tear, cut, or change in any way the gray foam block.     Increase activity slowly as tolerated    Complete by:  As directed      TED hose    Complete by:  As directed   Use stockings  (TED hose) for 2 weeks on both leg(s).  You may remove them at night for sleeping.           Follow-up Information    Follow up with Lorn Junes, MD On 01/15/2015.   Specialty:  Orthopedic Surgery   Why:  appt time 2:45 pm   Contact information:   [place] [address] [phone]       Follow up with [facility].   Why:  They will contact you to schedule home therapy visits.   Contact information:   [place] [address] [phone]        Signed: Linda Hedges 01/04/2015, 8:55 AM

## 2015-01-04 NOTE — Progress Notes (Signed)
Occupational Therapy Treatment Patient Details Name: David [name] MRN: [id number] DOB: Apr 15, 1950 Today's Date: 01/04/2015    History of present illness Patient is a 64 y/o male s/p L TKA. PMH includes MSSA, anxiety, ileostomy, hypothyroidism.   OT comments  Pt progressing very well with OT. Educated and demonstrated tub transfer with 3 in 1, pt demonstrated understanding and ability to complete. Pt reports he dressed himself and got ready this morning all on his own. Pt ready to d/c home today from an OT standpoint. All goals met, at this time pt d/c from OT services.    Follow Up Recommendations  No OT follow up;Supervision - Intermittent    Equipment Recommendations  3 in 1 bedside comode    Recommendations for Other Services      Precautions / Restrictions Precautions Precautions: Knee Restrictions Weight Bearing Restrictions: Yes LLE Weight Bearing: Weight bearing as tolerated       Mobility Bed Mobility               General bed mobility comments: Pt in chair upon arrival, returned to chair  Transfers Overall transfer level: Needs assistance Equipment used: Rolling walker (2 wheeled) Transfers: Sit to/from Stand Sit to Stand: Modified independent (Device/Increase time)              Balance Overall balance assessment: Needs assistance         Standing balance support: Bilateral upper extremity supported Standing balance-Leahy Scale: Poor Standing balance comment: RW for support                   ADL Overall ADL's : Needs assistance/impaired                                 Tub/ Shower Transfer: Tub transfer;Supervision/safety;Ambulation;3 in 1;Rolling walker Tub/Shower Transfer Details (indicate cue type and reason): Pt demonstrated ability to complete tub transfer with 3 in 1 using proper technique.    General ADL Comments: Educated pt on tub transfer with 3 in 1, provided handout       Vision                     Perception     Praxis      Cognition   Behavior During Therapy: WFL for tasks assessed/performed Overall Cognitive Status: Within Functional Limits for tasks assessed                       Extremity/Trunk Assessment               Exercises     Shoulder Instructions       General Comments      Pertinent Vitals/ Pain       Pain Assessment: 0-10 Pain Score: 1  Pain Location: L knee Pain Descriptors / Indicators: Other (Comment) (Stretching) Pain Intervention(s): Limited activity within patient's tolerance;Monitored during session  Home Living                                          Prior Functioning/Environment              Frequency       Progress Toward Goals  OT Goals(current goals can now be found in the care plan section)  Progress towards OT goals: Goals met/education  completed, patient discharged from OT  Acute Rehab OT Goals Patient Stated Goal: to go home today OT Goal Formulation: With patient  Plan All goals met and education completed, patient discharged from OT services;Discharge plan remains appropriate    Co-evaluation                 End of Session Equipment Utilized During Treatment: Gait belt;Rolling walker   Activity Tolerance Patient tolerated treatment well   Patient Left in chair;with call bell/phone within reach   Nurse Communication          Time: 0950-1004 OT Time Calculation (min): 14 min  Charges: OT General Charges $OT Visit: 1 Procedure OT Treatments $Self Care/Home Management : 8-22 mins  Binnie Kand M.S., OTR/L Pager: [phone]  01/04/2015, 10:13 AM

## 2015-01-04 NOTE — Progress Notes (Signed)
Patient left with his wife to go home.

## 2015-01-22 ENCOUNTER — Encounter: Payer: Self-pay | Admitting: Physician Assistant

## 2015-01-22 ENCOUNTER — Other Ambulatory Visit: Payer: Self-pay | Admitting: Physician Assistant

## 2015-01-22 DIAGNOSIS — M1711 Unilateral primary osteoarthritis, right knee: Secondary | ICD-10-CM | POA: Insufficient documentation

## 2015-01-22 NOTE — H&P (Signed)
TOTAL KNEE ADMISSION H&P  Patient is being admitted for right total knee arthroplasty.  Subjective:  Chief Complaint:right knee pain.  HPI: David [name], 64 y.o. male, has a history of pain and functional disability in the right knee due to arthritis and has failed non-surgical conservative treatments for greater than 12 weeks to includeNSAID's and/or analgesics, corticosteriod injections, viscosupplementation injections, flexibility and strengthening excercises, supervised PT with diminished ADL's post treatment, use of assistive devices and activity modification.  Onset of symptoms was gradual, starting 10 years ago with gradually worsening course since that time. The patient noted prior procedures on the knee to include  arthroscopy and menisectomy on the right knee(s).  Patient currently rates pain in the right knee(s) at 10 out of 10 with activity. Patient has night pain, worsening of pain with activity and weight bearing, pain that interferes with activities of daily living, crepitus and joint swelling.  Patient has evidence of subchondral sclerosis, periarticular osteophytes and joint space narrowing by imaging studies.  There is no active infection.  Patient Active Problem List   Diagnosis Date Noted  . Primary localized osteoarthritis of right knee   . Obstructive sleep apnea 01/01/2015  . 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 DJD (degenerative joint disease) of knee 01/01/2015  . Cancer of left kidney ([place]) 12/20/2014  . Primary localized osteoarthritis of left knee 12/20/2014  . Gastroenteritis, acute 10/29/2012  . Dehydration 10/29/2012  . Abdominal pain, other specified site 10/28/2012  . Diarrhea 10/28/2012  . Ileostomy present ([place]) 10/28/2012  . Leukocytosis, unspecified 10/28/2012  . Acute renal failure ([place]) 10/28/2012  . Hypercalcemia 10/28/2012   Past Medical History  Diagnosis Date  . Thyroid disease   . Ulcerative colitis ([place])   .  History of kidney stones   . Primary localized osteoarthritis of left knee   . Sleep apnea   . Asthma     as a child   . Hypothyroidism   . Anxiety   . GERD (gastroesophageal reflux disease)   . Cancer [facility])     kidney cancer  . 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 Primary localized osteoarthritis of right knee     Past Surgical History  Procedure Laterality Date  . Appendectomy    . Tonsillectomy    . Ileostomy    . Partial nephrectomy    . Total colectomy    . Knee arthroscopy Left 01/01/2015    Procedure: ARTHROSCOPY KNEE;  Surgeon: Elsie Saas, MD;  Location: [facility];  Service: Orthopedics;  Laterality: Left;  . Total knee arthroplasty Left 01/01/2015    Procedure: TOTAL KNEE ARTHROPLASTY;  Surgeon: Elsie Saas, MD;  Location: [facility];  Service: Orthopedics;  Laterality: Left;     (Not in a hospital admission) No Known Allergies    Current outpatient prescriptions:  .  ALPRAZolam (XANAX) 0.25 MG tablet, Take 0.25 mg by mouth 3 (three) times daily as needed for anxiety., Disp: , Rfl:  .  aspirin EC 325 MG EC tablet, 1 tab a day for the next 30 days to prevent blood clots, Disp: 30 tablet, Rfl: 0 .  cefUROXime (CEFTIN) 500 MG tablet, Take 1 tablet (500 mg total) by mouth 2 (two) times daily with a meal., Disp: 28 tablet, Rfl: 0 .  celecoxib (CELEBREX) 200 MG capsule, 1 tab po q day with food for pain and  swelling, Disp: 30 capsule, Rfl: 0 .  docusate sodium (COLACE) 100 MG capsule, 1 tab 2 times a  day while on narcotics.  STOOL SOFTENER, Disp: 60 capsule, Rfl: 0 .  DULoxetine (CYMBALTA) 30 MG capsule, Take 30 mg by mouth at bedtime., Disp: , Rfl:  .  Fluticasone-Salmeterol (ADVAIR) 250-50 MCG/DOSE AEPB, Inhale 1 puff into the lungs 2 (two) times daily., Disp: , Rfl:  .  levothyroxine (SYNTHROID, LEVOTHROID) 175 MCG tablet, Take 175 mcg by mouth daily before breakfast., Disp: , Rfl:  .  Multiple Vitamin (MULTIVITAMIN WITH MINERALS) TABS, Take 1  tablet by mouth daily., Disp: , Rfl:  .  Omega-3 Fatty Acids (FISH OIL) 1000 MG CAPS, Take 1 capsule by mouth 2 (two) times daily., Disp: , Rfl:  .  omeprazole (PRILOSEC OTC) 20 MG tablet, Take 20 mg by mouth daily., Disp: , Rfl:  .  oxyCODONE (OXY IR/ROXICODONE) 5 MG immediate release tablet, 1-2 tablets every 4-6 hrs as needed for pain, Disp: 100 tablet, Rfl: 0 .  OxyCODONE (OXYCONTIN) 20 mg T12A 12 hr tablet, Take 1 tablet (20 mg total) by mouth every 12 (twelve) hours., Disp: 30 tablet, Rfl: 0 .  polyethylene glycol (MIRALAX / GLYCOLAX) packet, 17grams in 16 oz of water twice a day until bowel movement.  LAXITIVE.  Restart if two days since last bowel movement, Disp: 60 each, Rfl: 0 .  Tetrahydrozoline HCl (VISINE OP), Place 1 drop into both eyes as needed (for dry eyes)., Disp: , Rfl:     Social History  Substance Use Topics  . Smoking status: Never Smoker   . Smokeless tobacco: Not on file  . Alcohol Use: No    Family History  Problem Relation Age of Onset  . Cancer Father     thyroid     Review of Systems  Constitutional: Negative.   HENT: Negative.   Eyes: Negative.   Respiratory: Negative.   Cardiovascular: Negative.   Gastrointestinal: Negative.   Genitourinary: Negative.   Musculoskeletal: Positive for back pain and joint pain.  Skin: Negative.   Neurological: Negative.   Endo/Heme/Allergies: Negative.   Psychiatric/Behavioral: Negative.     Objective:  Physical Exam  Constitutional: He is oriented to person, place, and time. He appears well-developed and well-nourished.  HENT:  Head: Normocephalic and atraumatic.  Mouth/Throat: Oropharynx is clear and moist.  Eyes: Conjunctivae and EOM are normal. Pupils are equal, round, and reactive to light.  Neck: Neck supple.  Cardiovascular: Normal rate, regular rhythm and normal heart sounds.   Respiratory: Effort normal and breath sounds normal.  GI: Soft. Bowel sounds are normal.  Genitourinary:  Not pertinent to  current symptomatology therefore not examined.  Musculoskeletal:  Right knee has full range with varus deformity, 2+ crepitus, 1+ synovitis, medial joint line tenderness, Normal patella tracking.  Left knee has well healed total knee incision with range of motion from -4 to 121 degrees.  Mild pain and swelling.  2 + DP pulses bilaterally.  Neurological: He is alert and oriented to person, place, and time.  Skin: Skin is warm and dry.  Psychiatric: He has a normal mood and affect. His behavior is normal.    Vital signs in last 24 hours: Today's Vitals   01/22/15 1500  BP: 132/89  Pulse: 85  Temp: 98.1 F (36.7 C)  Height: 5\' 8"  (1.727 m)  Weight: 81.647 kg (180 lb)  SpO2: 99%    Labs:   Estimated body mass index is 27.38 kg/(m^2) as calculated from the following:   Height as of this encounter: 5\' 8"  (1.727 m).   Weight as of this  encounter: 81.647 kg (180 lb).   Imaging Review Plain radiographs demonstrate severe degenerative joint disease of the right knee(s). The overall alignment issignificant varus. The bone quality appears to be good for age and reported activity level.  Assessment/Plan:  End stage arthritis, right knee  Patient Active Problem List   Diagnosis Date Noted  . Primary localized osteoarthritis of right knee   . Obstructive sleep apnea 01/01/2015  . 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 DJD (degenerative joint disease) of knee 01/01/2015  . Cancer of left kidney ([place]) 12/20/2014  . Primary localized osteoarthritis of left knee 12/20/2014  . Gastroenteritis, acute 10/29/2012  . Dehydration 10/29/2012  . Abdominal pain, other specified site 10/28/2012  . Diarrhea 10/28/2012  . Ileostomy present ([place]) 10/28/2012  . Leukocytosis, unspecified 10/28/2012  . Acute renal failure ([place]) 10/28/2012  . Hypercalcemia 10/28/2012    The patient history, physical examination, clinical judgment of the provider and imaging studies are  consistent with end stage degenerative joint disease of the right knee(s) and total knee arthroplasty is deemed medically necessary. The treatment options including medical management, injection therapy arthroscopy and arthroplasty were discussed at length. The risks and benefits of total knee arthroplasty were presented and reviewed. The risks due to aseptic loosening, infection, stiffness, patella tracking problems, thromboembolic complications and other imponderables were discussed. The patient acknowledged the explanation, agreed to proceed with the plan and consent was signed. Patient is being admitted for inpatient treatment for surgery, pain control, PT, OT, prophylactic antibiotics, VTE prophylaxis, progressive ambulation and ADL's and discharge planning. The patient is planning to be discharged home with home health services

## 2015-02-01 ENCOUNTER — Encounter (HOSPITAL_COMMUNITY): Payer: Self-pay

## 2015-02-01 ENCOUNTER — Encounter (HOSPITAL_COMMUNITY)
Admission: RE | Admit: 2015-02-01 | Discharge: 2015-02-01 | Disposition: A | Discharge status: Home / Self Care | Payer: BLUE CROSS/BLUE SHIELD | Source: Ambulatory Visit | Attending: Orthopedic Surgery | Admitting: Orthopedic Surgery

## 2015-02-01 DIAGNOSIS — M1711 Unilateral primary osteoarthritis, right knee: Secondary | ICD-10-CM | POA: Diagnosis not present

## 2015-02-01 DIAGNOSIS — Z0183 Encounter for blood typing: Secondary | ICD-10-CM | POA: Diagnosis not present

## 2015-02-01 DIAGNOSIS — Z01812 Encounter for preprocedural laboratory examination: Secondary | ICD-10-CM | POA: Diagnosis not present

## 2015-02-01 LAB — CBC WITH DIFFERENTIAL/PLATELET
BASOS PCT: 1 %
Basophils Absolute: 0 10*3/uL (ref 0.0–0.1)
EOS ABS: 0.3 10*3/uL (ref 0.0–0.7)
EOS PCT: 3 %
HCT: 40 % (ref 39.0–52.0)
HEMOGLOBIN: 13.3 g/dL (ref 13.0–17.0)
Lymphocytes Relative: 25 %
Lymphs Abs: 2 10*3/uL (ref 0.7–4.0)
MCH: 30.4 pg (ref 26.0–34.0)
MCHC: 33.3 g/dL (ref 30.0–36.0)
MCV: 91.3 fL (ref 78.0–100.0)
Monocytes Absolute: 0.8 10*3/uL (ref 0.1–1.0)
Monocytes Relative: 10 %
NEUTROS PCT: 61 %
Neutro Abs: 4.9 10*3/uL (ref 1.7–7.7)
PLATELETS: 292 10*3/uL (ref 150–400)
RBC: 4.38 MIL/uL (ref 4.22–5.81)
RDW: 13.1 % (ref 11.5–15.5)
WBC: 8 10*3/uL (ref 4.0–10.5)

## 2015-02-01 LAB — SURGICAL PCR SCREEN
MRSA, PCR: NEGATIVE
STAPHYLOCOCCUS AUREUS: NEGATIVE

## 2015-02-01 LAB — COMPREHENSIVE METABOLIC PANEL
ALBUMIN: 3.9 g/dL (ref 3.5–5.0)
ALK PHOS: 86 U/L (ref 38–126)
ALT: 22 U/L (ref 17–63)
ANION GAP: 9 (ref 5–15)
AST: 27 U/L (ref 15–41)
BUN: 14 mg/dL (ref 6–20)
CALCIUM: 9.8 mg/dL (ref 8.9–10.3)
CHLORIDE: 104 mmol/L (ref 101–111)
CO2: 24 mmol/L (ref 22–32)
Creatinine, Ser: 1.03 mg/dL (ref 0.61–1.24)
GFR calc Af Amer: >60 mL/min (ref >60)
GFR calc non Af Amer: >60 mL/min (ref >60)
GLUCOSE: 70 mg/dL (ref 65–99)
Potassium: 4.4 mmol/L (ref 3.5–5.1)
SODIUM: 137 mmol/L (ref 135–145)
Total Bilirubin: 0.6 mg/dL (ref 0.3–1.2)
Total Protein: 7.2 g/dL (ref 6.5–8.1)

## 2015-02-01 LAB — PROTIME-INR
INR: 1.14 (ref 0.00–1.49)
Prothrombin Time: 14.8 seconds (ref 11.6–15.2)

## 2015-02-01 LAB — TYPE AND SCREEN
ABO/RH(D): O POS
ANTIBODY SCREEN: NEGATIVE

## 2015-02-01 LAB — APTT: aPTT: 30 seconds (ref 24–37)

## 2015-02-03 LAB — URINE CULTURE: CULTURE: NO GROWTH

## 2015-02-09 NOTE — H&P (Signed)
TOTAL KNEE ADMISSION H&P  Patient is being admitted for right total knee arthroplasty.  Subjective:  Chief Complaint:right knee pain.  HPI: David [name], 64 y.o. male, has a history of pain and functional disability in the right knee due to arthritis and has failed non-surgical conservative treatments for greater than 12 weeks to includeNSAID's and/or analgesics, corticosteriod injections, viscosupplementation injections, flexibility and strengthening excercises, supervised PT with diminished ADL's post treatment, use of assistive devices and activity modification. Onset of symptoms was gradual, starting 10 years ago with gradually worsening course since that time. The patient noted prior procedures on the knee to include arthroscopy and menisectomy on the right knee(s). Patient currently rates pain in the right knee(s) at 10 out of 10 with activity. Patient has night pain, worsening of pain with activity and weight bearing, pain that interferes with activities of daily living, crepitus and joint swelling. Patient has evidence of subchondral sclerosis, periarticular osteophytes and joint space narrowing by imaging studies. There is no active infection.  Patient Active Problem List   Diagnosis Date Noted  . Primary localized osteoarthritis of right knee   . Obstructive sleep apnea 01/01/2015  . 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 DJD (degenerative joint disease) of knee 01/01/2015  . Cancer of left kidney ([place]) 12/20/2014  . Primary localized osteoarthritis of left knee 12/20/2014  . Gastroenteritis, acute 10/29/2012  . Dehydration 10/29/2012  . Abdominal pain, other specified site 10/28/2012  . Diarrhea 10/28/2012  . Ileostomy present ([place]) 10/28/2012  . Leukocytosis, unspecified 10/28/2012  . Acute renal failure ([place]) 10/28/2012  . Hypercalcemia 10/28/2012   Past Medical History  Diagnosis Date  .  Thyroid disease   . Ulcerative colitis ([place])   . History of kidney stones   . Primary localized osteoarthritis of left knee   . Sleep apnea   . Asthma     as a child   . Hypothyroidism   . Anxiety   . GERD (gastroesophageal reflux disease)   . Cancer [facility])     kidney cancer  . 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 Primary localized osteoarthritis of right knee     Past Surgical History  Procedure Laterality Date  . Appendectomy    . Tonsillectomy    . Ileostomy    . Partial nephrectomy    . Total colectomy    . Knee arthroscopy Left 01/01/2015    Procedure: ARTHROSCOPY KNEE; Surgeon: Elsie Saas, MD; Location: [place]; Service: Orthopedics; Laterality: Left;  . Total knee arthroplasty Left 01/01/2015    Procedure: TOTAL KNEE ARTHROPLASTY; Surgeon: Elsie Saas, MD; Location: [facility]; Service: Orthopedics; Laterality: Left;     (Not in a hospital admission) No Known Allergies    Current outpatient prescriptions:  . ALPRAZolam (XANAX) 0.25 MG tablet, Take 0.25 mg by mouth 3 (three) times daily as needed for anxiety., Disp: , Rfl:  . aspirin EC 325 MG EC tablet, 1 tab a day for the next 30 days to prevent blood clots, Disp: 30 tablet, Rfl: 0 . cefUROXime (CEFTIN) 500 MG tablet, Take 1 tablet (500 mg total) by mouth 2 (two) times daily with a meal., Disp: 28 tablet, Rfl: 0 . celecoxib (CELEBREX) 200 MG capsule, 1 tab po q day with food for pain and swelling, Disp: 30 capsule, Rfl: 0 . docusate sodium (COLACE) 100 MG capsule, 1 tab 2 times a day while on narcotics. STOOL SOFTENER, Disp: 60 capsule, Rfl: 0 . DULoxetine (CYMBALTA) 30 MG capsule, Take 30  mg by mouth at bedtime., Disp: , Rfl:  . Fluticasone-Salmeterol (ADVAIR) 250-50 MCG/DOSE AEPB, Inhale 1 puff into the lungs 2 (two) times daily., Disp: , Rfl:  . levothyroxine (SYNTHROID, LEVOTHROID) 175 MCG  tablet, Take 175 mcg by mouth daily before breakfast., Disp: , Rfl:  . Multiple Vitamin (MULTIVITAMIN WITH MINERALS) TABS, Take 1 tablet by mouth daily., Disp: , Rfl:  . Omega-3 Fatty Acids (FISH OIL) 1000 MG CAPS, Take 1 capsule by mouth 2 (two) times daily., Disp: , Rfl:  . omeprazole (PRILOSEC OTC) 20 MG tablet, Take 20 mg by mouth daily., Disp: , Rfl:  . oxyCODONE (OXY IR/ROXICODONE) 5 MG immediate release tablet, 1-2 tablets every 4-6 hrs as needed for pain, Disp: 100 tablet, Rfl: 0 . OxyCODONE (OXYCONTIN) 20 mg T12A 12 hr tablet, Take 1 tablet (20 mg total) by mouth every 12 (twelve) hours., Disp: 30 tablet, Rfl: 0 . polyethylene glycol (MIRALAX / GLYCOLAX) packet, 17grams in 16 oz of water twice a day until bowel movement. LAXITIVE. Restart if two days since last bowel movement, Disp: 60 each, Rfl: 0 . Tetrahydrozoline HCl (VISINE OP), Place 1 drop into both eyes as needed (for dry eyes)., Disp: , Rfl:     Social History  Substance Use Topics  . Smoking status: Never Smoker   . Smokeless tobacco: Not on file  . Alcohol Use: No    Family History  Problem Relation Age of Onset  . Cancer Father     thyroid     Review of Systems  Constitutional: Negative.  HENT: Negative.  Eyes: Negative.  Respiratory: Negative.  Cardiovascular: Negative.  Gastrointestinal: Negative.  Genitourinary: Negative.  Musculoskeletal: Positive for back pain and joint pain.  Skin: Negative.  Neurological: Negative.  Endo/Heme/Allergies: Negative.  Psychiatric/Behavioral: Negative.    Objective:  Physical Exam  Constitutional: He is oriented to person, place, and time. He appears well-developed and well-nourished.  HENT:  Head: Normocephalic and atraumatic.  Mouth/Throat: Oropharynx is clear and moist.  Eyes: Conjunctivae and EOM are normal. Pupils are equal, round, and reactive to light.  Neck: Neck supple.  Cardiovascular: Normal rate,  regular rhythm and normal heart sounds.  Respiratory: Effort normal and breath sounds normal.  GI: Soft. Bowel sounds are normal.  Genitourinary:  Not pertinent to current symptomatology therefore not examined.  Musculoskeletal:  Right knee has full range with varus deformity, 2+ crepitus, 1+ synovitis, medial joint line tenderness, Normal patella tracking. Left knee has well healed total knee incision with range of motion from -4 to 121 degrees. Mild pain and swelling. 2 + DP pulses bilaterally.  Neurological: He is alert and oriented to person, place, and time.  Skin: Skin is warm and dry.  Psychiatric: He has a normal mood and affect. His behavior is normal.    Vital signs in last 24 hours: Today's Vitals   01/22/15 1500  BP: 132/89  Pulse: 85  Temp: 98.1 F (36.7 C)  Height: 5\' 8"  (1.727 m)  Weight: 81.647 kg (180 lb)  SpO2: 99%    Labs:   Estimated body mass index is 27.38 kg/(m^2) as calculated from the following:  Height as of this encounter: 5\' 8"  (1.727 m).  Weight as of this encounter: 81.647 kg (180 lb).   Imaging Review Plain radiographs demonstrate severe degenerative joint disease of the right knee(s). The overall alignment issignificant varus. The bone quality appears to be good for age and reported activity level.  Assessment/Plan:  End stage arthritis, right knee  Patient Active  Problem List   Diagnosis Date Noted  . Primary localized osteoarthritis of right knee   . Obstructive sleep apnea 01/01/2015  . 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 DJD (degenerative joint disease) of knee 01/01/2015  . Cancer of left kidney ([place]) 12/20/2014  . Primary localized osteoarthritis of left knee 12/20/2014  . Gastroenteritis, acute 10/29/2012  . Dehydration 10/29/2012  . Abdominal pain, other specified site 10/28/2012  . Diarrhea 10/28/2012  . Ileostomy present ([place]) 10/28/2012   . Leukocytosis, unspecified 10/28/2012  . Acute renal failure ([place]) 10/28/2012  . Hypercalcemia 10/28/2012    The patient history, physical examination, clinical judgment of the provider and imaging studies are consistent with end stage degenerative joint disease of the right knee(s) and total knee arthroplasty is deemed medically necessary. The treatment options including medical management, injection therapy arthroscopy and arthroplasty were discussed at length. The risks and benefits of total knee arthroplasty were presented and reviewed. The risks due to aseptic loosening, infection, stiffness, patella tracking problems, thromboembolic complications and other imponderables were discussed. The patient acknowledged the explanation, agreed to proceed with the plan and consent was signed. Patient is being admitted for inpatient treatment for surgery, pain control, PT, OT, prophylactic antibiotics, VTE prophylaxis, progressive ambulation and ADL's and discharge planning. The patient is planning to be discharged home with home health services

## 2015-02-11 MED ORDER — CEFAZOLIN SODIUM-DEXTROSE 2-3 GM-% IV SOLR
2.0000 g | INTRAVENOUS | Status: AC
  Administered 2015-02-12: 2 g via INTRAVENOUS
  Filled 2015-02-11: qty 50

## 2015-02-11 MED ORDER — VANCOMYCIN HCL IN DEXTROSE 1-5 GM/200ML-% IV SOLN
1000.0000 mg | INTRAVENOUS | Status: AC
Start: 2015-02-12 — End: 2015-02-12
  Administered 2015-02-12: 1000 mg via INTRAVENOUS
  Filled 2015-02-11: qty 200

## 2015-02-12 ENCOUNTER — Inpatient Hospital Stay (HOSPITAL_COMMUNITY)
Admission: RE | Admit: 2015-02-12 | Discharge: 2015-02-14 | DRG: 470 | Disposition: A | Discharge status: Home Health | Payer: BLUE CROSS/BLUE SHIELD | Source: Ambulatory Visit | Attending: Orthopedic Surgery | Admitting: Orthopedic Surgery

## 2015-02-12 ENCOUNTER — Inpatient Hospital Stay (HOSPITAL_COMMUNITY): Payer: BLUE CROSS/BLUE SHIELD | Admitting: Anesthesiology

## 2015-02-12 ENCOUNTER — Encounter (HOSPITAL_COMMUNITY)
Admission: RE | Disposition: A | Discharge status: Home Health | Payer: Self-pay | Source: Ambulatory Visit | Attending: Orthopedic Surgery

## 2015-02-12 ENCOUNTER — Encounter (HOSPITAL_COMMUNITY): Payer: Self-pay | Admitting: *Deleted

## 2015-02-12 DIAGNOSIS — M1711 Unilateral primary osteoarthritis, right knee: Principal | ICD-10-CM | POA: Diagnosis present

## 2015-02-12 DIAGNOSIS — G4733 Obstructive sleep apnea (adult) (pediatric): Secondary | ICD-10-CM | POA: Diagnosis present

## 2015-02-12 DIAGNOSIS — M179 Osteoarthritis of knee, unspecified: Secondary | ICD-10-CM | POA: Diagnosis present

## 2015-02-12 DIAGNOSIS — K219 Gastro-esophageal reflux disease without esophagitis: Secondary | ICD-10-CM | POA: Diagnosis present

## 2015-02-12 DIAGNOSIS — Z932 Ileostomy status: Secondary | ICD-10-CM | POA: Diagnosis not present

## 2015-02-12 DIAGNOSIS — Z22321 Carrier or suspected carrier of Methicillin susceptible Staphylococcus aureus: Secondary | ICD-10-CM

## 2015-02-12 DIAGNOSIS — Z85528 Personal history of other malignant neoplasm of kidney: Secondary | ICD-10-CM

## 2015-02-12 DIAGNOSIS — Z96652 Presence of left artificial knee joint: Secondary | ICD-10-CM | POA: Diagnosis present

## 2015-02-12 DIAGNOSIS — E039 Hypothyroidism, unspecified: Secondary | ICD-10-CM | POA: Diagnosis present

## 2015-02-12 DIAGNOSIS — K529 Noninfective gastroenteritis and colitis, unspecified: Secondary | ICD-10-CM | POA: Diagnosis present

## 2015-02-12 DIAGNOSIS — C642 Malignant neoplasm of left kidney, except renal pelvis: Secondary | ICD-10-CM | POA: Diagnosis present

## 2015-02-12 DIAGNOSIS — M1710 Unilateral primary osteoarthritis, unspecified knee: Secondary | ICD-10-CM | POA: Diagnosis present

## 2015-02-12 DIAGNOSIS — Z905 Acquired absence of kidney: Secondary | ICD-10-CM

## 2015-02-12 DIAGNOSIS — Z7951 Long term (current) use of inhaled steroids: Secondary | ICD-10-CM

## 2015-02-12 HISTORY — PX: TOTAL KNEE ARTHROPLASTY: SHX125

## 2015-02-12 SURGERY — ARTHROPLASTY, KNEE, TOTAL
Anesthesia: Spinal | Site: Knee | Laterality: Right

## 2015-02-12 MED ORDER — SODIUM CHLORIDE 0.9 % IR SOLN
Status: DC | PRN
  Administered 2015-02-12: 3000 mL

## 2015-02-12 MED ORDER — PROPOFOL 10 MG/ML IV BOLUS
INTRAVENOUS | Status: DC | PRN
  Administered 2015-02-12: 20 mg via INTRAVENOUS

## 2015-02-12 MED ORDER — LACTATED RINGERS IV SOLN
INTRAVENOUS | Status: DC
  Administered 2015-02-12 (×3): via INTRAVENOUS

## 2015-02-12 MED ORDER — DULOXETINE HCL 30 MG PO CPEP
30.0000 mg | ORAL_CAPSULE | Freq: Every day | ORAL | Status: DC
  Administered 2015-02-12 – 2015-02-13 (×2): 30 mg via ORAL
  Filled 2015-02-12 (×2): qty 1

## 2015-02-12 MED ORDER — LEVOTHYROXINE SODIUM 25 MCG PO TABS
175.0000 ug | ORAL_TABLET | Freq: Every day | ORAL | Status: DC
  Administered 2015-02-13 – 2015-02-14 (×2): 175 ug via ORAL
  Filled 2015-02-12 (×4): qty 1

## 2015-02-12 MED ORDER — BUPIVACAINE HCL (PF) 0.5 % IJ SOLN
INTRAMUSCULAR | Status: DC | PRN
  Administered 2015-02-12: 25 mL via PERINEURAL

## 2015-02-12 MED ORDER — ONDANSETRON HCL 4 MG/2ML IJ SOLN: 4.0000 mg | Freq: Four times a day (QID) | INTRAMUSCULAR | Status: DC | PRN

## 2015-02-12 MED ORDER — CHLORHEXIDINE GLUCONATE 4 % EX LIQD: 60.0000 mL | Freq: Once | CUTANEOUS | Status: DC

## 2015-02-12 MED ORDER — METOCLOPRAMIDE HCL 5 MG PO TABS: 5.0000 mg | ORAL_TABLET | Freq: Three times a day (TID) | ORAL | Status: DC | PRN

## 2015-02-12 MED ORDER — LACTATED RINGERS IV SOLN: INTRAVENOUS | Status: DC

## 2015-02-12 MED ORDER — ONDANSETRON HCL 4 MG/2ML IJ SOLN
INTRAMUSCULAR | Status: DC | PRN
Start: 2015-02-12 — End: 2015-02-12
  Administered 2015-02-12: 4 mg via INTRAVENOUS

## 2015-02-12 MED ORDER — OMEGA-3-ACID ETHYL ESTERS 1 G PO CAPS: 1.0000 g | ORAL_CAPSULE | Freq: Every day | ORAL | Status: DC

## 2015-02-12 MED ORDER — PHENOL 1.4 % MT LIQD: 1.0000 | OROMUCOSAL | Status: DC | PRN

## 2015-02-12 MED ORDER — PROMETHAZINE HCL 25 MG/ML IJ SOLN
6.2500 mg | INTRAMUSCULAR | Status: DC | PRN
  Administered 2015-02-12: 6.25 mg via INTRAVENOUS

## 2015-02-12 MED ORDER — PROMETHAZINE HCL 25 MG/ML IJ SOLN
INTRAMUSCULAR | Status: AC
  Filled 2015-02-12: qty 1

## 2015-02-12 MED ORDER — MENTHOL 3 MG MT LOZG
LOZENGE | OROMUCOSAL | Status: AC
  Administered 2015-02-12: 1
  Filled 2015-02-12: qty 9

## 2015-02-12 MED ORDER — BUPIVACAINE IN DEXTROSE 0.75-8.25 % IT SOLN
INTRATHECAL | Status: DC | PRN
  Administered 2015-02-12: 1.8 mL via INTRATHECAL

## 2015-02-12 MED ORDER — MIDAZOLAM HCL 2 MG/2ML IJ SOLN
INTRAMUSCULAR | Status: AC
  Administered 2015-02-12: 2 mg
  Filled 2015-02-12: qty 2

## 2015-02-12 MED ORDER — OXYCODONE HCL 5 MG PO TABS
5.0000 mg | ORAL_TABLET | ORAL | Status: DC | PRN
  Administered 2015-02-12 – 2015-02-14 (×8): 10 mg via ORAL
  Filled 2015-02-12 (×7): qty 2

## 2015-02-12 MED ORDER — DEXAMETHASONE SODIUM PHOSPHATE 10 MG/ML IJ SOLN
INTRAMUSCULAR | Status: DC | PRN
  Administered 2015-02-12: 10 mg via INTRAVENOUS

## 2015-02-12 MED ORDER — DEXAMETHASONE SODIUM PHOSPHATE 10 MG/ML IJ SOLN
INTRAMUSCULAR | Status: AC
  Filled 2015-02-12: qty 1

## 2015-02-12 MED ORDER — LIDOCAINE-EPINEPHRINE (PF) 1.5 %-1:200000 IJ SOLN
INTRAMUSCULAR | Status: DC | PRN
  Administered 2015-02-12: 15 mL via PERINEURAL

## 2015-02-12 MED ORDER — FENTANYL CITRATE (PF) 100 MCG/2ML IJ SOLN
INTRAMUSCULAR | Status: AC
  Administered 2015-02-12: 50 ug
  Filled 2015-02-12: qty 2

## 2015-02-12 MED ORDER — MENTHOL 3 MG MT LOZG: 1.0000 | LOZENGE | OROMUCOSAL | Status: DC | PRN

## 2015-02-12 MED ORDER — POVIDONE-IODINE 7.5 % EX SOLN: Freq: Once | CUTANEOUS | Status: DC

## 2015-02-12 MED ORDER — 0.9 % SODIUM CHLORIDE (POUR BTL) OPTIME
TOPICAL | Status: DC | PRN
  Administered 2015-02-12: 1000 mL

## 2015-02-12 MED ORDER — HYDROMORPHONE HCL 1 MG/ML IJ SOLN
INTRAMUSCULAR | Status: AC
  Administered 2015-02-12: 0.5 mg via INTRAVENOUS
  Filled 2015-02-12: qty 1

## 2015-02-12 MED ORDER — ALUM & MAG HYDROXIDE-SIMETH 200-200-20 MG/5ML PO SUSP: 30.0000 mL | ORAL | Status: DC | PRN

## 2015-02-12 MED ORDER — OXYCODONE HCL ER 10 MG PO T12A
20.0000 mg | EXTENDED_RELEASE_TABLET | Freq: Two times a day (BID) | ORAL | Status: DC
  Administered 2015-02-12 – 2015-02-14 (×4): 20 mg via ORAL
  Filled 2015-02-12 (×4): qty 2

## 2015-02-12 MED ORDER — ACETAMINOPHEN 650 MG RE SUPP: 650.0000 mg | Freq: Four times a day (QID) | RECTAL | Status: DC | PRN

## 2015-02-12 MED ORDER — PANTOPRAZOLE SODIUM 40 MG PO TBEC
40.0000 mg | DELAYED_RELEASE_TABLET | Freq: Every day | ORAL | Status: DC
  Administered 2015-02-12 – 2015-02-14 (×3): 40 mg via ORAL
  Filled 2015-02-12 (×4): qty 1

## 2015-02-12 MED ORDER — DEXAMETHASONE SODIUM PHOSPHATE 10 MG/ML IJ SOLN
10.0000 mg | Freq: Three times a day (TID) | INTRAMUSCULAR | Status: AC
  Administered 2015-02-12 – 2015-02-13 (×3): 10 mg via INTRAVENOUS
  Filled 2015-02-12 (×3): qty 1

## 2015-02-12 MED ORDER — SODIUM CHLORIDE 0.9 % IV SOLN: INTRAVENOUS | Status: DC | PRN

## 2015-02-12 MED ORDER — BUPIVACAINE-EPINEPHRINE 0.25% -1:200000 IJ SOLN
INTRAMUSCULAR | Status: DC | PRN
  Administered 2015-02-12: 30 mL

## 2015-02-12 MED ORDER — BUPIVACAINE-EPINEPHRINE (PF) 0.25% -1:200000 IJ SOLN
INTRAMUSCULAR | Status: AC
  Filled 2015-02-12: qty 30

## 2015-02-12 MED ORDER — METOCLOPRAMIDE HCL 5 MG/ML IJ SOLN: 5.0000 mg | Freq: Three times a day (TID) | INTRAMUSCULAR | Status: DC | PRN

## 2015-02-12 MED ORDER — LIDOCAINE HCL (CARDIAC) 20 MG/ML IV SOLN
INTRAVENOUS | Status: DC | PRN
  Administered 2015-02-12: 40 mg via INTRAVENOUS

## 2015-02-12 MED ORDER — CEFAZOLIN SODIUM-DEXTROSE 2-3 GM-% IV SOLR
2.0000 g | Freq: Four times a day (QID) | INTRAVENOUS | Status: AC
  Administered 2015-02-12 (×2): 2 g via INTRAVENOUS
  Filled 2015-02-12 (×2): qty 50

## 2015-02-12 MED ORDER — MOMETASONE FURO-FORMOTEROL FUM 100-5 MCG/ACT IN AERO
2.0000 | INHALATION_SPRAY | Freq: Two times a day (BID) | RESPIRATORY_TRACT | Status: DC
  Filled 2015-02-12 (×2): qty 8.8

## 2015-02-12 MED ORDER — OMEPRAZOLE MAGNESIUM 20 MG PO TBEC: 20.0000 mg | DELAYED_RELEASE_TABLET | Freq: Every day | ORAL | Status: DC

## 2015-02-12 MED ORDER — POTASSIUM CHLORIDE IN NACL 20-0.9 MEQ/L-% IV SOLN
INTRAVENOUS | Status: DC
  Administered 2015-02-12 – 2015-02-13 (×2): via INTRAVENOUS
  Filled 2015-02-12 (×2): qty 1000

## 2015-02-12 MED ORDER — HYDROMORPHONE HCL 1 MG/ML IJ SOLN
0.2500 mg | INTRAMUSCULAR | Status: DC | PRN
  Administered 2015-02-12 (×4): 0.5 mg via INTRAVENOUS

## 2015-02-12 MED ORDER — ACETAMINOPHEN 325 MG PO TABS
650.0000 mg | ORAL_TABLET | Freq: Four times a day (QID) | ORAL | Status: DC | PRN
  Administered 2015-02-12 – 2015-02-14 (×6): 650 mg via ORAL
  Filled 2015-02-12 (×5): qty 2

## 2015-02-12 MED ORDER — DOCUSATE SODIUM 100 MG PO CAPS
100.0000 mg | ORAL_CAPSULE | Freq: Two times a day (BID) | ORAL | Status: DC
  Administered 2015-02-12 – 2015-02-14 (×4): 100 mg via ORAL
  Filled 2015-02-12 (×4): qty 1

## 2015-02-12 MED ORDER — PROPOFOL 500 MG/50ML IV EMUL
INTRAVENOUS | Status: DC | PRN
  Administered 2015-02-12: 75 ug/kg/min via INTRAVENOUS

## 2015-02-12 MED ORDER — ALPRAZOLAM 0.25 MG PO TABS: 0.2500 mg | ORAL_TABLET | Freq: Three times a day (TID) | ORAL | Status: DC | PRN

## 2015-02-12 MED ORDER — HYDROMORPHONE HCL 1 MG/ML IJ SOLN
1.0000 mg | INTRAMUSCULAR | Status: DC | PRN
  Administered 2015-02-13: 1 mg via INTRAVENOUS
  Filled 2015-02-12 (×2): qty 1

## 2015-02-12 MED ORDER — POLYETHYLENE GLYCOL 3350 17 G PO PACK
17.0000 g | PACK | Freq: Two times a day (BID) | ORAL | Status: DC
  Administered 2015-02-12 – 2015-02-14 (×4): 17 g via ORAL
  Filled 2015-02-12 (×4): qty 1

## 2015-02-12 MED ORDER — OXYCODONE HCL 5 MG PO TABS
ORAL_TABLET | ORAL | Status: AC
  Administered 2015-02-12: 10 mg via ORAL
  Filled 2015-02-12: qty 2

## 2015-02-12 MED ORDER — CELECOXIB 200 MG PO CAPS
200.0000 mg | ORAL_CAPSULE | Freq: Two times a day (BID) | ORAL | Status: DC
  Administered 2015-02-12 – 2015-02-14 (×5): 200 mg via ORAL
  Filled 2015-02-12 (×5): qty 1

## 2015-02-12 MED ORDER — DIPHENHYDRAMINE HCL 12.5 MG/5ML PO ELIX: 12.5000 mg | ORAL_SOLUTION | ORAL | Status: DC | PRN

## 2015-02-12 MED ORDER — ONDANSETRON HCL 4 MG PO TABS: 4.0000 mg | ORAL_TABLET | Freq: Four times a day (QID) | ORAL | Status: DC | PRN

## 2015-02-12 MED ORDER — ACETAMINOPHEN 325 MG PO TABS
ORAL_TABLET | ORAL | Status: AC
  Administered 2015-02-12: 650 mg via ORAL
  Filled 2015-02-12: qty 2

## 2015-02-12 MED ORDER — ASPIRIN EC 325 MG PO TBEC
325.0000 mg | DELAYED_RELEASE_TABLET | Freq: Every day | ORAL | Status: DC
  Administered 2015-02-13 – 2015-02-14 (×2): 325 mg via ORAL
  Filled 2015-02-12 (×2): qty 1

## 2015-02-12 SURGICAL SUPPLY — 73 items
APL SKNCLS STERI-STRIP NONHPOA (GAUZE/BANDAGES/DRESSINGS) ×1
BANDAGE ESMARK 6X9 LF (GAUZE/BANDAGES/DRESSINGS) ×1 IMPLANT
BENZOIN TINCTURE PRP APPL 2/3 (GAUZE/BANDAGES/DRESSINGS) ×2 IMPLANT
BLADE SAGITTAL 25.0X1.19X90 (BLADE) ×2 IMPLANT
BLADE SAW RECIP 87.9 MT (BLADE) ×1 IMPLANT
BLADE SAW SGTL 11.0X1.19X90.0M (BLADE) IMPLANT
BLADE SAW SGTL 13.0X1.19X90.0M (BLADE) ×2 IMPLANT
BLADE SURG 10 STRL SS (BLADE) ×4 IMPLANT
BNDG CMPR 9X6 STRL LF SNTH (GAUZE/BANDAGES/DRESSINGS) ×1
BNDG CMPR MED 15X6 ELC VLCR LF (GAUZE/BANDAGES/DRESSINGS) ×1
BNDG ELASTIC 6X15 VLCR STRL LF (GAUZE/BANDAGES/DRESSINGS) ×2 IMPLANT
BNDG ESMARK 6X9 LF (GAUZE/BANDAGES/DRESSINGS) ×2
BOWL SMART MIX CTS (DISPOSABLE) ×2 IMPLANT
CAPT KNEE TOTAL 3 ATTUNE ×1 IMPLANT
CEMENT HV SMART SET (Cement) ×4 IMPLANT
COVER SURGICAL LIGHT HANDLE (MISCELLANEOUS) ×2 IMPLANT
CUFF TOURNIQUET SINGLE 34IN LL (TOURNIQUET CUFF) ×2 IMPLANT
CUFF TOURNIQUET SINGLE 44IN (TOURNIQUET CUFF) IMPLANT
DECANTER SPIKE VIAL GLASS SM (MISCELLANEOUS) ×2 IMPLANT
DRAPE EXTREMITY T 121X128X90 (DRAPE) ×2 IMPLANT
DRAPE INCISE IOBAN 66X45 STRL (DRAPES) ×2 IMPLANT
DRAPE PROXIMA HALF (DRAPES) ×2 IMPLANT
DRAPE U-SHAPE 47X51 STRL (DRAPES) ×2 IMPLANT
DRSG AQUACEL AG ADV 3.5X14 (GAUZE/BANDAGES/DRESSINGS) ×2 IMPLANT
DRSG PAD ABDOMINAL 8X10 ST (GAUZE/BANDAGES/DRESSINGS) ×4 IMPLANT
DURAPREP 26ML APPLICATOR (WOUND CARE) ×4 IMPLANT
ELECT CAUTERY BLADE 6.4 (BLADE) ×2 IMPLANT
ELECT REM PT RETURN 9FT ADLT (ELECTROSURGICAL) ×2
ELECTRODE REM PT RTRN 9FT ADLT (ELECTROSURGICAL) ×1 IMPLANT
EVACUATOR 1/8 PVC DRAIN (DRAIN) ×2 IMPLANT
FACESHIELD WRAPAROUND (MASK) ×2 IMPLANT
FACESHIELD WRAPAROUND OR TEAM (MASK) ×1 IMPLANT
GAUZE SPONGE 4X4 12PLY STRL (GAUZE/BANDAGES/DRESSINGS) ×2 IMPLANT
GLOVE BIO SURGEON STRL SZ7 (GLOVE) ×2 IMPLANT
GLOVE BIOGEL PI IND STRL 7.0 (GLOVE) ×1 IMPLANT
GLOVE BIOGEL PI IND STRL 7.5 (GLOVE) ×1 IMPLANT
GLOVE BIOGEL PI INDICATOR 7.0 (GLOVE) ×1
GLOVE BIOGEL PI INDICATOR 7.5 (GLOVE) ×1
GLOVE SS BIOGEL STRL SZ 7.5 (GLOVE) ×1 IMPLANT
GLOVE SUPERSENSE BIOGEL SZ 7.5 (GLOVE) ×1
GOWN STRL REUS W/ TWL LRG LVL3 (GOWN DISPOSABLE) ×1 IMPLANT
GOWN STRL REUS W/ TWL XL LVL3 (GOWN DISPOSABLE) ×2 IMPLANT
GOWN STRL REUS W/TWL LRG LVL3 (GOWN DISPOSABLE) ×2
GOWN STRL REUS W/TWL XL LVL3 (GOWN DISPOSABLE) ×4
HANDPIECE INTERPULSE COAX TIP (DISPOSABLE) ×2
HOOD PEEL AWAY FACE SHEILD DIS (HOOD) ×4 IMPLANT
IMMOBILIZER KNEE 22 UNIV (SOFTGOODS) ×2 IMPLANT
KIT BASIN OR (CUSTOM PROCEDURE TRAY) ×2 IMPLANT
KIT ROOM TURNOVER OR (KITS) ×2 IMPLANT
MANIFOLD NEPTUNE II (INSTRUMENTS) ×2 IMPLANT
MARKER SKIN DUAL TIP RULER LAB (MISCELLANEOUS) ×2 IMPLANT
NS IRRIG 1000ML POUR BTL (IV SOLUTION) ×2 IMPLANT
PACK TOTAL JOINT (CUSTOM PROCEDURE TRAY) ×2 IMPLANT
PACK UNIVERSAL I (CUSTOM PROCEDURE TRAY) ×2 IMPLANT
PAD ARMBOARD 7.5X6 YLW CONV (MISCELLANEOUS) ×4 IMPLANT
PADDING CAST COTTON 6X4 STRL (CAST SUPPLIES) ×2 IMPLANT
RUBBERBAND STERILE (MISCELLANEOUS) ×2 IMPLANT
SET HNDPC FAN SPRY TIP SCT (DISPOSABLE) ×1 IMPLANT
STRIP CLOSURE SKIN 1/2X4 (GAUZE/BANDAGES/DRESSINGS) ×2 IMPLANT
SUCTION FRAZIER TIP 10 FR DISP (SUCTIONS) ×2 IMPLANT
SUT MNCRL AB 3-0 PS2 18 (SUTURE) ×2 IMPLANT
SUT VIC AB 0 CT1 27 (SUTURE) ×4
SUT VIC AB 0 CT1 27XBRD ANBCTR (SUTURE) ×2 IMPLANT
SUT VIC AB 1 CT1 27 (SUTURE) ×2
SUT VIC AB 1 CT1 27XBRD ANBCTR (SUTURE) ×1 IMPLANT
SUT VIC AB 2-0 CT1 27 (SUTURE) ×4
SUT VIC AB 2-0 CT1 TAPERPNT 27 (SUTURE) ×2 IMPLANT
SYR 30ML SLIP (SYRINGE) ×2 IMPLANT
TOWEL OR 17X24 6PK STRL BLUE (TOWEL DISPOSABLE) ×2 IMPLANT
TOWEL OR 17X26 10 PK STRL BLUE (TOWEL DISPOSABLE) ×2 IMPLANT
TRAY FOLEY CATH 16FR SILVER (SET/KITS/TRAYS/PACK) ×2 IMPLANT
TUBE CONNECTING 12X1/4 (SUCTIONS) ×3 IMPLANT
YANKAUER SUCT BULB TIP NO VENT (SUCTIONS) ×3 IMPLANT

## 2015-02-12 NOTE — Anesthesia Preprocedure Evaluation (Signed)
Anesthesia Evaluation  Patient identified by MRN, date of birth, ID band Patient awake    Reviewed: Allergy & Precautions, NPO status , Patient's Chart, lab work & pertinent test results  Airway Mallampati: II  TM Distance: >3 FB Neck ROM: Full    Dental no notable dental hx.    Pulmonary sleep apnea ,    Pulmonary exam normal breath sounds clear to auscultation       Cardiovascular negative cardio ROS Normal cardiovascular exam Rhythm:Regular Rate:Normal     Neuro/Psych negative neurological ROS  negative psych ROS   GI/Hepatic Neg liver ROS, GERD  ,  Endo/Other  Hypothyroidism   Renal/GU negative Renal ROS  negative genitourinary   Musculoskeletal negative musculoskeletal ROS (+)   Abdominal   Peds negative pediatric ROS (+)  Hematology negative hematology ROS (+)   Anesthesia Other Findings   Reproductive/Obstetrics negative OB ROS                             Anesthesia Physical Anesthesia Plan  ASA: II  Anesthesia Plan: Spinal   Post-op Pain Management:    Induction: Intravenous  Airway Management Planned: Simple Face Mask  Additional Equipment:   Intra-op Plan:   Post-operative Plan:   Informed Consent: I have reviewed the patients History and Physical, chart, labs and discussed the procedure including the risks, benefits and alternatives for the proposed anesthesia with the patient or authorized representative who has indicated his/her understanding and acceptance.   Dental advisory given  Plan Discussed with: CRNA and Surgeon  Anesthesia Plan Comments:         Anesthesia Quick Evaluation

## 2015-02-12 NOTE — Transfer of Care (Signed)
Immediate Anesthesia Transfer of Care Note  Patient: David [name]  Procedure(s) Performed: Procedure(s): TOTAL RIGHT KNEE ARTHROPLASTY (Right)  Patient Location: PACU  Anesthesia Type:Spinal and MAC combined with regional for post-op pain  Level of Consciousness: awake, alert , oriented and patient cooperative  Airway & Oxygen Therapy: Patient Spontanous Breathing and Patient connected to nasal cannula oxygen  Post-op Assessment: Report given to RN and Post -op Vital signs reviewed and stable  Post vital signs: Reviewed and stable  Last Vitals:  Filed Vitals:   02/12/15 1105  BP: 163/89  Pulse: 74  Temp:   Resp: 22    Complications: No apparent anesthesia complications

## 2015-02-12 NOTE — Interval H&P Note (Signed)
History and Physical Interval Note:  02/12/2015 11:01 AM  [name]  has presented today for surgery, with the diagnosis of PRIMARY LOCALIZED OA RIGHT KNEE  The various methods of treatment have been discussed with the patient and family. After consideration of risks, benefits and other options for treatment, the patient has consented to  Procedure(s): TOTAL RIGHT KNEE ARTHROPLASTY (Right) as [name] surgical intervention .  The patient's history has been reviewed, patient examined, no change in status, stable for surgery.  I have reviewed the patient's chart and labs.  Questions were answered to the patient's satisfaction.     David [name]

## 2015-02-12 NOTE — Anesthesia Postprocedure Evaluation (Signed)
  Anesthesia Post-op Note  Patient: David [name]  Procedure(s) Performed: Procedure(s) (LRB): TOTAL RIGHT KNEE ARTHROPLASTY (Right)  Patient Location: PACU  Anesthesia Type: Spinal  Level of Consciousness: awake and alert   Airway and Oxygen Therapy: Patient Spontanous Breathing  Post-op Pain: mild  Post-op Assessment: Post-op Vital signs reviewed, Patient's Cardiovascular Status Stable, Respiratory Function Stable, Patent Airway and No signs of Nausea or vomiting  Last Vitals:  Filed Vitals:   02/12/15 1400  BP:   Pulse: 57  Temp:   Resp: 16    Post-op Vital Signs: stable   Complications: No apparent anesthesia complications

## 2015-02-12 NOTE — Op Note (Signed)
MRN:     [id number] DOB/AGE:    09-17-1950 / 64 y.o.       OPERATIVE REPORT    DATE OF PROCEDURE:  02/12/2015       PREOPERATIVE DIAGNOSIS:  PRIMARY LOCALIZED OA RIGHT KNEE      Estimated body mass index is 28.16 kg/(m^2) as calculated from the following:   Height as of this encounter: 5' 8"  (1.727 m).   Weight as of this encounter: 84 kg (185 lb 3 oz).                                                        POSTOPERATIVE DIAGNOSIS:   SAME                                                                      PROCEDURE:  Procedure(s): TOTAL RIGHT KNEE ARTHROPLASTY Using Depuy Sigma RP implants #5 Femur, #5Tibia, 18m attune RP bearing, 35 Patella     SURGEON: Mable Lashley A    ASSISTANT:  Kirstin Shepperson PA-C   (Present and scrubbed throughout the case, critical for assistance with exposure, retraction, instrumentation, and closure.)         ANESTHESIA: Spinal with Femoral Nerve Block  DRAINS: foley, 2 medium hemovac in knee   TOURNIQUET TIME: [id number]  COMPLICATIONS:  None     SPECIMENS: None   INDICATIONS FOR PROCEDURE: The patient has  OA RIGHT KNEE, varus deformities, XR shows bone on bone arthritis. Patient has failed all conservative measures including anti-inflammatory medicines, narcotics, attempts at  exercise and weight loss, cortisone injections and viscosupplementation.  Risks and benefits of surgery have been discussed, questions answered.   DESCRIPTION OF PROCEDURE: The patient identified by armband, received  right femoral nerve block and IV antibiotics[facility] The Patient taken to the operating room, appropriate anesthetic  monitors were attached General endotracheal anesthesia induced with  the patient in supine position, Foley catheter was inserted. Tourniquet  applied high to the operative thigh. Lateral post and foot positioner  applied to the table, the lower extremity was then prepped and draped  in usual sterile fashion from the  ankle to the tourniquet. Time-out procedure was performed. The limb was wrapped with an Esmarch bandage and the tourniquet inflated to 365 mmHg. We began the operation by making the anterior midline incision starting at handbreadth above the patella going over the patella 1 cm medial to and  4 cm distal to the tibial tubercle. Small bleeders in the skin and the  subcutaneous tissue identified and cauterized. Transverse retinaculum was incised and reflected medially and a medial parapatellar arthrotomy was accomplished. the patella was everted and theprepatellar fat pad resected. The superficial medial collateral  ligament was then elevated from anterior to posterior along the proximal  flare of the tibia and anterior half of the menisci resected. The knee was hyperflexed exposing bone on bone arthritis. Peripheral and notch osteophytes as well as the cruciate ligaments were then resected. We continued to  work our way around posteriorly along the proximal tibia,  and externally  rotated the tibia subluxing it out from underneath the femur. A McHale  retractor was placed through the notch and a lateral Hohmann retractor  placed, and we then drilled through the proximal tibia in line with the  axis of the tibia followed by an intramedullary guide rod and 2-degree  posterior slope cutting guide. The tibial cutting guide was pinned into place  allowing resection of 4 mm of bone medially and about 6 mm of bone  laterally because of her varus deformity. Satisfied with the tibial resection, we then  entered the distal femur 2 mm anterior to the PCL origin with the  intramedullary guide rod and applied the distal femoral cutting guide  set at 27m, with 5 degrees of valgus. This was pinned along the  epicondylar axis. At this point, the distal femoral cut was accomplished without difficulty. We then sized for a #5 femoral component and pinned the guide in 3 degrees of external rotation.The chamfer cutting  guide was pinned into place. The anterior, posterior, and chamfer cuts were accomplished without difficulty followed by  the Attune RP box cutting guide and the box cut. We also removed posterior osteophytes from the posterior femoral condyles. At this  time, the knee was brought into full extension. We checked our  extension and flexion gaps and found them symmetric at 626m  The patella thickness measured at 24 mm. We set the cutting guide at 15 and removed the posterior 9.5-10 mm  of the patella sized for 35 button and drilled the lollipop. The knee  was then once again hyperflexed exposing the proximal tibia. We sized for a #5 tibial base plate, applied the smokestack and the conical reamer followed by the the Delta fin keel punch. We then hammered into place the Attune RP trial femoral component, inserted a 8-mm trial bearing, trial patellar button, and took the knee through range of motion from 0-130 degrees. No thumb pressure was required for patellar  tracking. At this point, all trial components were removed, a double batch of DePuy HV cement  was mixed and applied to all bony metallic mating surfaces except for the posterior condyles of the femur itself. In order, we  hammered into place the tibial tray and removed excess cement, the femoral component and removed excess cement, a 8-mm Attune RP bearing  was inserted, and the knee brought to full extension with compression.  The patellar button was clamped into place, and excess cement  removed. While the cement cured the wound was irrigated out with normal saline solution pulse lavage, and medium Hemovac drains were placed.. Ligament stability and patellar tracking were checked and found to be excellent. The tourniquet was then released and hemostasis was obtained with cautery. The parapatellar arthrotomy was closed with  #1 ethibond suture. The subcutaneous tissue with 0 and 2-0 undyed  Vicryl suture, and 4-0 Monocryl.. A dressing of Xeroform,   4 x 4, dressing sponges, Webril, and Ace wrap applied. Needle and sponge count were correct times 2.The patient awakened, extubated, and taken to recovery room without difficulty. Vascular status was normal, pulses 2+ and symmetric.   Bentlie Withem A 02/12/2015, 12:43 PM

## 2015-02-12 NOTE — Anesthesia Procedure Notes (Addendum)
Anesthesia Regional Block:  Femoral nerve block  Pre-Anesthetic Checklist: ,, timeout performed, Correct Patient, Correct Site, Correct Laterality, Correct Procedure, Correct Position, site marked, Risks and benefits discussed,  Surgical consent,  Pre-op evaluation,  At surgeon's request and post-op pain management  Laterality: Right  Prep: chloraprep       Needles:  Injection technique: Single-shot  Needle Type: Echogenic Stimulator Needle     Needle Length: 9cm 9 cm Needle Gauge: 21 and 21 G    Additional Needles:  Procedures: ultrasound guided (picture in chart) Femoral nerve block Narrative:  Injection made incrementally with aspirations every 5 mL.  Performed by: Personally   Additional Notes: Patient tolerated the procedure well without complications   Spinal Patient location during procedure: OR Staffing Performed by: anesthesiologist  Preanesthetic Checklist Completed: patient identified, site marked, surgical consent, pre-op evaluation, timeout performed, IV checked, risks and benefits discussed and monitors and equipment checked Spinal Block Patient position: sitting Prep: Betadine Patient monitoring: heart rate, continuous pulse ox and blood pressure Location: L4-5 Injection technique: single-shot Needle Needle type: Sprotte  Needle gauge: 24 G Needle length: 9 cm Additional Notes Expiration date of kit checked and confirmed. Patient tolerated procedure well, without complications.

## 2015-02-12 NOTE — Progress Notes (Signed)
Called to pts room by pts wife.  Pt sitting up on side of bed. Pt had his wife remove him from the cpm machine , place it on the floor and was now attempting to empty his less than full colostomy bag. Pt was bleeding from hemovac, which had become loose during pts activities. Assisted pt in cleaning up bed and placed pt in reclyner.asssited in emptying of colostomy.  Reinforced dressing to right extremity.  hemovac reattached and draining well. Informed pt "again" to please call for staff assistance before attempting to mobilize or if he needs help with cpm machine or colostomy.

## 2015-02-12 NOTE — Progress Notes (Signed)
Orthopedic Tech Progress Note Patient Details:  David [name] 25-Nov-1950 [id number]  CPM Right Knee CPM Right Knee: On Right Knee Flexion (Degrees): 90 Right Knee Extension (Degrees): 0 Additional Comments: trapeze bar patient helper Viewed order from doctor's order list  Hildred Priest 02/12/2015, 2:15 PM

## 2015-02-13 ENCOUNTER — Encounter (HOSPITAL_COMMUNITY): Payer: Self-pay | Admitting: Orthopedic Surgery

## 2015-02-13 LAB — BASIC METABOLIC PANEL
Anion gap: 9 (ref 5–15)
BUN: 13 mg/dL (ref 6–20)
CALCIUM: 9.1 mg/dL (ref 8.9–10.3)
CHLORIDE: 104 mmol/L (ref 101–111)
CO2: 25 mmol/L (ref 22–32)
CREATININE: 1.1 mg/dL (ref 0.61–1.24)
GFR calc Af Amer: >60 mL/min (ref >60)
GFR calc non Af Amer: >60 mL/min (ref >60)
Glucose, Bld: 167 mg/dL — ABNORMAL HIGH (ref 65–99)
Potassium: 4.4 mmol/L (ref 3.5–5.1)
Sodium: 138 mmol/L (ref 135–145)

## 2015-02-13 LAB — CBC
HEMATOCRIT: 36.2 % — AB (ref 39.0–52.0)
HEMOGLOBIN: 11.7 g/dL — AB (ref 13.0–17.0)
MCH: 29 pg (ref 26.0–34.0)
MCHC: 32.3 g/dL (ref 30.0–36.0)
MCV: 89.8 fL (ref 78.0–100.0)
Platelets: 306 10*3/uL (ref 150–400)
RBC: 4.03 MIL/uL — ABNORMAL LOW (ref 4.22–5.81)
RDW: 12.8 % (ref 11.5–15.5)
WBC: 18.9 10*3/uL — AB (ref 4.0–10.5)

## 2015-02-13 MED ORDER — DOXYCYCLINE HYCLATE 100 MG PO TABS
100.0000 mg | ORAL_TABLET | Freq: Two times a day (BID) | ORAL | Status: DC
  Administered 2015-02-13 – 2015-02-14 (×3): 100 mg via ORAL
  Filled 2015-02-13 (×3): qty 1

## 2015-02-13 NOTE — Progress Notes (Signed)
Patient fell in the bathroom.  He had gotten up while we were in his room and asked if he could go to the bathroom since he stated he was cleared by PT to be up.  I watched as he went to the bathroom without any problems.  Once inside I asked he to call if he need assistance.  He decided to empty his ileostomy on his own while in the bathroom, and fell back on his buttocks.  I rushed in there and found him sitting on the floor.  I asked if he was hurt, he stated "no", and we helped him up and to his bed.  Before he laid down, he asked if he could take a couple of strolls in his room, I said yes and he walked around and said he felt fine.  I called Dr. Archie Endo office to report the incidence, fill out the paper report and held a huddle, and Santiago Glad and I did the safety zone.  I later talked with Cyril Mourning told her about the incidence and reported that he was fine and he stated that he did not have any pain from the fall and he felt fine.

## 2015-02-13 NOTE — Progress Notes (Signed)
Subjective: 1 Day Post-Op Procedure(s) (LRB): TOTAL RIGHT KNEE ARTHROPLASTY (Right) Patient reports pain as 4 on 0-10 scale.    Objective: Vital signs in last 24 hours: Temp:  [97.5 F (36.4 C)-98.7 F (37.1 C)] 98 F (36.7 C) (11/08 0526) Pulse Rate:  [57-87] 87 (11/08 0526) Resp:  [12-23] 16 (11/08 0526) BP: (109-167)/(73-104) 109/73 mmHg (11/08 0526) SpO2:  [95 %-100 %] 99 % (11/08 0526) Weight:  [84 kg (185 lb 3 oz)] 84 kg (185 lb 3 oz) (11/07 0911)  Intake/Output from previous day: 11/07 0701 - 11/08 0700 In: 1440 [P.O.:240; I.V.:1200] Out: 2642 [Urine:1900; Drains:400; Stool:302; Blood:40] Intake/Output this shift:     Recent Labs  02/13/15 0308  HGB 11.7*    Recent Labs  02/13/15 0308  WBC 18.9*  RBC 4.03*  HCT 36.2*  PLT 306    Recent Labs  02/13/15 0308  NA 138  K 4.4  CL 104  CO2 25  BUN 13  CREATININE 1.10  GLUCOSE 167*  CALCIUM 9.1   No results for input(s): LABPT, INR in the last 72 hours.  ABD soft Neurovascular intact Incision: dressing C/D/I  Assessment/Plan: 1 Day Post-Op Procedure(s) (LRB): TOTAL RIGHT KNEE ARTHROPLASTY (Right) Advance diet D/C IV fluids Plan for discharge tomorrow Discharge home with home health  Linda Hedges 02/13/2015, 7:26 AM

## 2015-02-13 NOTE — Evaluation (Addendum)
Physical Therapy Evaluation Patient Details Name: David [name] MRN: [id number] DOB: Aug 28, 1950 Today's Date: 02/13/2015   History of Present Illness  Patient is a 64 y/o male s/p R TKA with L TKA 6 weeks ago. PMH includes MSSA, anxiety, ileostomy, hypothyroidism.  Clinical Impression  Pt in bed in CPM on arrival with great ROM and moving RLE exceptionally well on his own. Pt aware of restrictions, plan and goals from prior TKA and should progress quickly. Pt will benefit from acute therapy to maximize mobility, strength, ROM and function to decrease burden of care. Handout provided for HEP.     Follow Up Recommendations Home health PT    Equipment Recommendations  None recommended by PT    Recommendations for Other Services       Precautions / Restrictions Precautions Precautions: Fall;Knee Restrictions Weight Bearing Restrictions: Yes LLE Weight Bearing: Weight bearing as tolerated      Mobility  Bed Mobility Overal bed mobility: Modified Independent                Transfers Overall transfer level: Needs assistance   Transfers: Sit to/from Stand Sit to Stand: Supervision         General transfer comment: cues for hand placement and safety  Ambulation/Gait Ambulation/Gait assistance: Supervision Ambulation Distance (Feet): 300 Feet Assistive device: Rolling walker (2 wheeled) Gait Pattern/deviations: Step-through pattern;Decreased stride length   Gait velocity interpretation: Below normal speed for age/gender General Gait Details: pt maintaining heel strike on RLE, no buckling and cues for position in RW to not step past wheels  Stairs Stairs: Yes Stairs assistance: Min assist Stair Management: Backwards;With walker Number of Stairs: 4 General stair comments: pt performed with cues for sequence and assist to stabilize RW  Wheelchair Mobility    Modified Rankin (Stroke Patients Only)       Balance Overall balance assessment: Needs  assistance   Sitting balance-Leahy Scale: Good       Standing balance-Leahy Scale: Fair                               Pertinent Vitals/Pain Pain Assessment: 0-10 Pain Score: 3  Pain Location: RLE Pain Descriptors / Indicators: Aching Pain Intervention(s): Repositioned;Monitored during session;Limited activity within patient's tolerance    Home Living Family/patient expects to be discharged to:: Private residence Living Arrangements: Spouse/significant other Available Help at Discharge: Family;Available 24 hours/day Type of Home: House Home Access: Stairs to enter Entrance Stairs-Rails: None Entrance Stairs-Number of Steps: 3 Home Layout: One level Home Equipment: Crutches;Walker - 2 wheels;Bedside commode      Prior Function Level of Independence: Independent               Hand Dominance        Extremity/Trunk Assessment   Upper Extremity Assessment: Overall WFL for tasks assessed           Lower Extremity Assessment: RLE deficits/detail RLE Deficits / Details: excellent ROM and strength for POD #1    Cervical / Trunk Assessment: Normal  Communication   Communication: No difficulties  Cognition Arousal/Alertness: Awake/alert Behavior During Therapy: WFL for tasks assessed/performed Overall Cognitive Status: Within Functional Limits for tasks assessed                      General Comments      Exercises Total Joint Exercises Ankle Circles/Pumps: AROM;Seated;Both;5 reps Quad Sets: AROM;Right;5 reps;Supine Heel Slides: AROM;Right;5 reps;Supine Straight Leg  Raises: AROM;Right;5 reps;Supine      Assessment/Plan    PT Assessment Patient needs continued PT services  PT Diagnosis Difficulty walking   PT Problem List Decreased strength;Decreased activity tolerance;Decreased mobility;Pain;Decreased knowledge of use of DME  PT Treatment Interventions Gait training;DME instruction;Stair training;Functional mobility  training;Therapeutic activities;Therapeutic exercise;Patient/family education   PT Goals (Current goals can be found in the Care Plan section) Acute Rehab PT Goals Patient 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[employer], broadcasting) PT Goal Formulation: With patient Time For Goal Achievement: 02/20/15 Potential to Achieve Goals: Good    Frequency 7X/week   Barriers to discharge        Co-evaluation               End of Session Equipment Utilized During Treatment: Gait belt Activity Tolerance: Patient tolerated treatment well Patient left: in chair;with call bell/phone within reach;with nursing/sitter in room Nurse Communication: Mobility status;Precautions;Weight bearing status         Time: 0820-0845 PT Time Calculation (min) (ACUTE ONLY): 25 min   Charges:   PT Evaluation $Initial PT Evaluation Tier I: 1 Procedure PT Treatments $Gait Training: 8-22 mins   PT G CodesMelford Aase 02/13/2015, 9:03 AM Elwyn Reach, [place]

## 2015-02-13 NOTE — Progress Notes (Signed)
OT Cancellation Note  Patient Details Name: David [name] MRN: [id number] DOB: 10/11/1950   Cancelled Treatment:    Reason Eval/Treat Not Completed: OT screened, no needs identified, will sign off. Pt recently had other knee operated on and has no OT concerns.   Benito Mccreedy OTR/L [phone] 02/13/2015, 3:12 PM

## 2015-02-13 NOTE — Care Management Note (Signed)
Case Management Note  Patient Details  Name: David [name] MRN: [id number] Date of Birth: [birth year]  Subjective/Objective:  64 yr old male s/p right total knee arthroplasty.                  Action/Plan: Case manager spoke with [name] at the bedside concerning home health and DME needs at discharge.    Expected Discharge Date:    02/13/15       Expected Discharge Plan:  [place]  In-House Referral:     Discharge planning Services  CM Consult  Post Acute Care Choice:  Home Health, Durable Medical Equipment Choice offered to:  Patient  DME Arranged:  3-N-1, Walker rolling DME Agency:  [employer]  HH Arranged:  PT HH Agency:  [facility]  Status of Service:  Completed, signed off  Medicare Important Message Given:    Date Medicare IM Given:    Medicare IM give by:    Date Additional Medicare IM Given:    Additional Medicare Important Message give by:     If discussed at [place] of Stay Meetings, dates discussed:    Additional Comments:  Ninfa Meeker, RN 02/13/2015, 11:01 AM

## 2015-02-13 NOTE — Progress Notes (Signed)
Physical Therapy Treatment Patient Details Name: CHRISTPHER STOGSDILL MRN: [id number] DOB: November 28, 1950 Today's Date: February 27, 2015    History of Present Illness Patient is a 64 y/o male s/p R TKA with L TKA 6 weeks ago. PMH includes MSSA, anxiety, ileostomy, hypothyroidism.    PT Comments    Pt continues to move exceptionally well and was able to increase gait and HEP this afternoon. Pt educated for HEP and transfers. In bed in CPM on right with foam roll LLE.  Follow Up Recommendations  Home health PT     Equipment Recommendations       Recommendations for Other Services       Precautions / Restrictions Precautions Precautions: Fall;Knee Restrictions RLE Weight Bearing: Weight bearing as tolerated LLE Weight Bearing: Weight bearing as tolerated    Mobility  Bed Mobility Overal bed mobility: Modified Independent                Transfers Overall transfer level: Needs assistance   Transfers: Sit to/from Stand Sit to Stand: Supervision         General transfer comment: cues for hand placement and safety  Ambulation/Gait Ambulation/Gait assistance: Supervision Ambulation Distance (Feet): 400 Feet Assistive device: Rolling walker (2 wheeled) Gait Pattern/deviations: Step-through pattern;Decreased stride length   Gait velocity interpretation: Below normal speed for age/gender General Gait Details: pt maintaining heel strike on RLE, no buckling and cues for position in RW to not step past wheels   Stairs Stairs: Yes Stairs assistance: Min assist Stair Management: Backwards;With walker Number of Stairs: 2 General stair comments: pt performed with assist for RW but no cues for sequence needed  Wheelchair Mobility    Modified Rankin (Stroke Patients Only)       Balance                                    Cognition Arousal/Alertness: Awake/alert Behavior During Therapy: WFL for tasks assessed/performed Overall Cognitive Status: Within  Functional Limits for tasks assessed                      Exercises Total Joint Exercises Heel Slides: AROM;Right;Supine;15 reps Straight Leg Raises: AROM;Right;Supine;15 reps Long Arc Quad: AROM;Seated;Right;15 reps Goniometric ROM: 3-100 Marching in Standing: AROM;Seated;Right;15 reps    General Comments        Pertinent Vitals/Pain Pain Assessment: No/denies pain    Home Living                      Prior Function            PT Goals (current goals can now be found in the care plan section) Progress towards PT goals: Progressing toward goals    Frequency       PT Plan Current plan remains appropriate    Co-evaluation             End of Session   Activity Tolerance: Patient tolerated treatment well Patient left: in bed;with call bell/phone within reach;in CPM     Time: 1214-1255 PT Time Calculation (min) (ACUTE ONLY): 41 min  Charges:  $Gait Training: 8-22 mins $Therapeutic Exercise: 8-22 mins $Therapeutic Activity: 8-22 mins                    G Codes:      Melford Aase 02/27/2015, 1:00 PM Elwyn Reach, [place]

## 2015-02-14 LAB — BASIC METABOLIC PANEL
ANION GAP: 7 (ref 5–15)
BUN: 15 mg/dL (ref 6–20)
CALCIUM: 9.1 mg/dL (ref 8.9–10.3)
CO2: 25 mmol/L (ref 22–32)
Chloride: 106 mmol/L (ref 101–111)
Creatinine, Ser: 1.05 mg/dL (ref 0.61–1.24)
GFR calc Af Amer: >60 mL/min (ref >60)
Glucose, Bld: 132 mg/dL — ABNORMAL HIGH (ref 65–99)
Potassium: 4 mmol/L (ref 3.5–5.1)
Sodium: 138 mmol/L (ref 135–145)

## 2015-02-14 LAB — CBC
HEMATOCRIT: 31.5 % — AB (ref 39.0–52.0)
Hemoglobin: 10.2 g/dL — ABNORMAL LOW (ref 13.0–17.0)
MCH: 29.7 pg (ref 26.0–34.0)
MCHC: 32.4 g/dL (ref 30.0–36.0)
MCV: 91.6 fL (ref 78.0–100.0)
Platelets: 270 10*3/uL (ref 150–400)
RBC: 3.44 MIL/uL — ABNORMAL LOW (ref 4.22–5.81)
RDW: 13.1 % (ref 11.5–15.5)
WBC: 25.1 10*3/uL — AB (ref 4.0–10.5)

## 2015-02-14 MED ORDER — OXYCODONE HCL 5 MG PO TABS: ORAL_TABLET | ORAL | Status: DC

## 2015-02-14 MED ORDER — DOXYCYCLINE HYCLATE 100 MG PO TABS: 100.0000 mg | ORAL_TABLET | Freq: Two times a day (BID) | ORAL | Status: DC

## 2015-02-14 MED ORDER — ASPIRIN 325 MG PO TBEC: DELAYED_RELEASE_TABLET | ORAL | Status: DC

## 2015-02-14 MED ORDER — OXYCODONE HCL ER 20 MG PO T12A: 20.0000 mg | EXTENDED_RELEASE_TABLET | Freq: Two times a day (BID) | ORAL | Status: DC

## 2015-02-14 NOTE — Progress Notes (Signed)
[name] discharged home per MD order. Discharge instructions reviewed and discussed with patient. All questions and concerns answered. Copy of instructions and scripts given to patient. No IV.  Patient escorted to car by staff in a wheelchair. No distress noted upon discharge.   Tarri Abernethy R 02/14/2015 11:07 AM

## 2015-02-14 NOTE — Progress Notes (Signed)
Physical Therapy Treatment Patient Details Name: CORNELL BOURBON MRN: [id number] DOB: 03/23/1951 Today's Date: 02/14/2015    History of Present Illness Patient is a 64 y/o male s/p R TKA with L TKA 6 weeks ago. PMH includes MSSA, anxiety, ileostomy, hypothyroidism.    PT Comments    Pt was standing when PT and SPTA entered the room. Pt stated he felt confident with navigating stairs and gait training. Pt performed HEP with slight increase in pain and soreness but it did not hinder with his treatment.   Follow Up Recommendations  Home health PT     Equipment Recommendations  None recommended by PT       Precautions / Restrictions Precautions Precautions: Fall;Knee Restrictions Weight Bearing Restrictions: Yes RLE Weight Bearing: Weight bearing as tolerated    Mobility                 Transfers Overall transfer level: Needs assistance Equipment used: Rolling walker (2 wheeled) Transfers: Sit to/from Stand Sit to Stand: Modified independent                                              Cognition Arousal/Alertness: Awake/alert Behavior During Therapy: WFL for tasks assessed/performed Overall Cognitive Status: Within Functional Limits for tasks assessed                      Exercises Total Joint Exercises Heel Slides: AROM;Right;10 reps;Seated (assisted with LLE) Long Arc Quad: AROM;Right;10 reps;Seated (hold for 5) Knee Flexion: AROM;Right;10 reps;Seated Goniometric ROM: 5-115        Pertinent Vitals/Pain Pain Assessment: No/denies pain Pain Score: 2  Pain Location: RLE Pain Descriptors / Indicators: Sore Pain Intervention(s): Monitored during session           PT Goals (current goals can now be found in the care plan section) Progress towards PT goals: Progressing toward goals    Frequency  7X/week    PT Plan Current plan remains appropriate       End of Session   Activity Tolerance: Patient tolerated treatment  well Patient left: in chair;with call bell/phone within reach;with family/visitor present     Time: 1025-1040 PT Time Calculation (min) (ACUTE ONLY): 15 min  Charges:   [place], [place] OFFICE   02/14/2015, 11:42 AM

## 2015-02-14 NOTE — Discharge Summary (Signed)
Patient ID: JAIDEEP POLLACK MRN: [id number] DOB/AGE: [birth year] 64 y.o.  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 Thyroid disease   . Ulcerative colitis ([place])   . History of kidney stones   . Primary localized osteoarthritis of left knee   . Sleep apnea   . Asthma     as a child   . Hypothyroidism   . Anxiety   . GERD (gastroesophageal reflux disease)   . Cancer [facility])     kidney cancer  . 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 Primary localized osteoarthritis of right knee     Surgeries: Procedure(s): TOTAL RIGHT KNEE ARTHROPLASTY on 02/12/2015   Consultants:    Discharged Condition: Improved  Hospital Course: David [name] is an 64 y.o. male who was admitted 02/12/2015 for operative treatment ofPrimary localized osteoarthritis of right knee. Patient has severe unremitting pain that affects sleep, daily activities, and work/hobbies. After pre-op clearance the patient was taken to the operating room on 02/12/2015 and underwent  Procedure(s): TOTAL RIGHT KNEE ARTHROPLASTY.    Patient was given perioperative antibiotics: Anti-infectives    Start     Dose/Rate Route Frequency Ordered Stop   02/14/15 0000  doxycycline (VIBRA-TABS) 100 MG tablet     100 mg Oral Every 12 hours 02/14/15 1009     02/13/15 1000  doxycycline (VIBRA-TABS) tablet 100 mg     100 mg Oral Every 12 hours 02/13/15 0636     02/12/15 1715  ceFAZolin (ANCEF) IVPB 2 g/50 mL premix     2 g 100 mL/hr over 30 Minutes Intravenous Every 6 hours 02/12/15 1508 02/12/15 2351   02/12/15 0600   ceFAZolin (ANCEF) IVPB 2 g/50 mL premix     2 g 100 mL/hr over 30 Minutes Intravenous On call to O.R. 02/11/15 1321 02/12/15 1115   02/12/15 0600  vancomycin (VANCOCIN) IVPB 1000 mg/200 mL premix     1,000 mg 200 mL/hr over 60 Minutes Intravenous On call to O.R. 02/11/15 1321 02/12/15 1107       Patient was given sequential compression devices, early ambulation, and chemoprophylaxis to prevent DVT.  Patient benefited maximally from hospital stay and there were no complications.    Recent vital signs: Patient Vitals for the past 24 hrs:  BP Temp Temp src Pulse Resp SpO2  02/14/15 0530 131/81 mmHg 97.3 F (36.3 C) Oral 68 18 100 %  02/13/15 2145 (!) 144/77 mmHg 98.5 F (36.9 C) Oral 85 18 97 %  02/13/15 1347 122/67 mmHg 99.1 F (37.3 C) - 87 18 97 %     Recent laboratory studies:  Recent Labs  02/13/15 0308 02/14/15 0530  WBC 18.9* 25.1*  HGB 11.7* 10.2*  HCT 36.2* 31.5*  PLT 306 270  NA 138 138  K 4.4 4.0  CL 104 106  CO2 25 25  BUN 13 15  CREATININE 1.10 1.05  GLUCOSE 167* 132*  CALCIUM 9.1 9.1     Discharge Medications:     Medication List    STOP taking these medications        cefUROXime 500 MG tablet  Commonly known as:  CEFTIN      TAKE these medications        ALPRAZolam 0.25 MG tablet  Commonly known as:  XANAX  Take 0.25 mg by mouth 3 (three) times daily as needed for anxiety.     aspirin 325 MG EC tablet  1 tab a day for the next 30 days to prevent blood clots     celecoxib 200 MG capsule  Commonly known as:  CELEBREX  1 tab po q day with food for pain and  swelling     docusate sodium 100 MG capsule  Commonly known as:  COLACE  1 tab 2 times a day while on narcotics.  STOOL SOFTENER     doxycycline 100 MG tablet  Commonly known as:  VIBRA-TABS  Take 1 tablet (100 mg total) by mouth every 12 (twelve) hours.     DULoxetine 30 MG capsule  Commonly known as:  CYMBALTA  Take 30 mg by mouth at bedtime.     Fish Oil 1000 MG Caps  Take  1 capsule by mouth 2 (two) times daily.     Fluticasone-Salmeterol 250-50 MCG/DOSE Aepb  Commonly known as:  ADVAIR  Inhale 1 puff into the lungs 2 (two) times daily.     levothyroxine 175 MCG tablet  Commonly known as:  SYNTHROID, LEVOTHROID  Take 175 mcg by mouth daily before breakfast.     multivitamin with minerals Tabs tablet  Take 1 tablet by mouth daily.     omeprazole 20 MG tablet  Commonly known as:  PRILOSEC OTC  Take 20 mg by mouth daily.     oxyCODONE 5 MG immediate release tablet  Commonly known as:  Oxy IR/ROXICODONE  1-2 tablets every 4-6 hrs as needed for pain     oxyCODONE 20 mg 12 hr tablet  Commonly known as:  OXYCONTIN  Take 1 tablet (20 mg total) by mouth every 12 (twelve) hours.     polyethylene glycol packet  Commonly known as:  MIRALAX / GLYCOLAX  17grams in 16 oz of water twice a day until bowel movement.  LAXITIVE.  Restart if two days since last bowel movement     VISINE OP  Place 1 drop into both eyes as needed (for dry eyes).        Diagnostic Studies: No results found.  Disposition: 06-Home-Health Care Svc      Discharge Instructions    CPM    Complete by:  As directed   Continuous passive motion machine (CPM):      Use the CPM from 0 to 90 for 6 hours per day.       You may break it up into 2 or 3 sessions per day.      Use CPM for 2 weeks or until you are told to stop.     Call MD / Call 911    Complete by:  As directed   If you experience chest pain or shortness of breath, CALL 911 and be transported to the hospital emergency room.  If you develope a fever above 101 F, pus (white drainage) or increased drainage or redness at the wound, or calf pain, call your surgeon's office.     Change dressing    Complete by:  As directed   Change the gauze dressing daily with sterile 4 x 4 inch gauze and apply TED hose.  DO NOT REMOVE BANDAGE OVER SURGICAL INCISION.  [place] WHOLE LEG INCLUDING OVER THE WATERPROOF BANDAGE  WITH SOAP AND WATER EVERY  DAY.     Constipation Prevention    Complete by:  As directed   Drink plenty of fluids.  Prune juice may be helpful.  You may use a stool softener, such as Colace (over the counter) 100 mg twice a day.  Use MiraLax (over the counter) for constipation as needed.     Diet - low sodium heart healthy    Complete by:  As directed      Discharge instructions    Complete by:  As directed   INSTRUCTIONS AFTER JOINT REPLACEMENT   Remove items at home which could result in a fall. This includes throw rugs or furniture in walking pathways ICE to the affected joint every three hours while awake for 30 minutes at a time, for at least the first 3-5 days, and then as needed for pain and swelling.  Continue to use ice for pain and swelling. You may notice swelling that will progress down to the foot and ankle.  This is normal after surgery.  Elevate your leg when you are not up walking on it.   Continue to use the breathing machine you got in the hospital (incentive spirometer) which will help keep your temperature down.  It is common for your temperature to cycle up and down following surgery, especially at night when you are not up moving around and exerting yourself.  The breathing machine keeps your lungs expanded and your temperature down.   DIET:  As you were doing prior to hospitalization, we recommend a well-balanced diet.  DRESSING / WOUND CARE / SHOWERING  Keep the surgical dressing until follow up.  The dressing is water proof, so you can shower without any extra covering.  IF THE DRESSING FALLS OFF or the wound gets wet inside, change the dressing with sterile gauze.  Please use good hand washing techniques before changing the dressing.  Do not use any lotions or creams on the incision until instructed by your surgeon.    ACTIVITY  Increase activity slowly as tolerated, but follow the weight bearing instructions below.   No driving for 6 weeks or until further direction given by your physician.   You cannot drive while taking narcotics.  No lifting or carrying greater than 10 lbs. until further directed by your surgeon. Avoid periods of inactivity such as sitting longer than an hour when not asleep. This helps prevent blood clots.  You may return to work once you are authorized by your doctor.     WEIGHT BEARING   Weight bearing as tolerated with assist device (walker, cane, etc) as directed, use it as long as suggested by your surgeon or therapist, typically at least 2-3 weeks.   EXERCISES  Results after joint replacement surgery are often greatly improved when you follow the exercise, range of motion and muscle strengthening exercises prescribed by your doctor. Safety measures are also important to protect the joint from further injury. Any time any of these exercises cause you to have increased pain or swelling, decrease what you are doing until you are comfortable again and then slowly increase them. If you have problems or questions, call your caregiver or physical therapist for advice.   Rehabilitation is important following a joint replacement. After just a few days of immobilization, the muscles of the leg can become weakened and shrink (atrophy).  These exercises are designed to build up the tone and strength of the thigh and leg muscles and to improve motion. Often  times heat used for twenty to thirty minutes before working out will loosen up your tissues and help with improving the range of motion but do not use heat for the first two weeks following surgery (sometimes heat can increase post-operative swelling).   These exercises can be done on a training (exercise) mat, on the floor, on a table or on a bed. Use whatever works the best and is most comfortable for you.    Use music or television while you are exercising so that the exercises are a pleasant break in your day. This will make your life better with the exercises acting as a break in your routine that you can look  forward to.   Perform all exercises about fifteen times, three times per day or as directed.  You should exercise both the operative leg and the other leg as well.   Exercises include:   Quad Sets - Tighten up the muscle on the front of the thigh (Quad) and hold for 5-10 seconds.   Straight Leg Raises - With your knee straight (if you were given a brace, keep it on), lift the leg to 60 degrees, hold for 3 seconds, and slowly lower the leg.  Perform this exercise against resistance later as your leg gets stronger.  Leg Slides: Lying on your back, slowly slide your foot toward your buttocks, bending your knee up off the floor (only go as far as is comfortable). Then slowly slide your foot back down until your leg is flat on the floor again.  Angel Wings: Lying on your back spread your legs to the side as far apart as you can without causing discomfort.  Hamstring Strength:  Lying on your back, push your heel against the floor with your leg straight by tightening up the muscles of your buttocks.  Repeat, but this time bend your knee to a comfortable angle, and push your heel against the floor.  You may put a pillow under the heel to make it more comfortable if necessary.   A rehabilitation program following joint replacement surgery can speed recovery and prevent re-injury in the future due to weakened muscles. Contact your doctor or a physical therapist for more information on knee rehabilitation.    CONSTIPATION  Constipation is defined medically as fewer than three stools per week and severe constipation as less than one stool per week.  Even if you have a regular bowel pattern at home, your normal regimen is likely to be disrupted due to multiple reasons following surgery.  Combination of anesthesia, postoperative narcotics, change in appetite and fluid intake all can affect your bowels.   YOU MUST use at least one of the following options; they are listed in order of increasing strength to get the  job done.  They are all available over the counter, and you may need to use some, POSSIBLY even all of these options:    Drink plenty of fluids (prune juice may be helpful) and high fiber foods Colace 100 mg by mouth twice a day  Senokot for constipation as directed and as needed Dulcolax (bisacodyl), take with full glass of water  Miralax (polyethylene glycol) once or twice a day as needed.  If you have tried all these things and are unable to have a bowel movement in the first 3-4 days after surgery call either your surgeon or your primary doctor.    If you experience loose stools or diarrhea, hold the medications until you stool forms back up.  If your symptoms do not get better within 1 week or if they get worse, check with your doctor.  If you experience "the worst abdominal pain ever" or develop nausea or vomiting, please contact the office immediately for further recommendations for treatment.   ITCHING:  If you experience itching with your medications, try taking only a single pain pill, or even half a pain pill at a time.  You can also use Benadryl over the counter for itching or also to help with sleep.   TED HOSE STOCKINGS:  Use stockings on both legs until for at least 2 weeks or as directed by physician office. They may be removed at night for sleeping.  MEDICATIONS:  See your medication summary on the "After Visit Summary" that nursing will review with you.  You may have some home medications which will be placed on hold until you complete the course of blood thinner medication.  It is important for you to complete the blood thinner medication as prescribed.  PRECAUTIONS:  If you experience chest pain or shortness of breath - call 911 immediately for transfer to the hospital emergency department.   If you develop a fever greater that 101 F, purulent drainage from wound, increased redness or drainage from wound, foul odor from the wound/dressing, or calf pain - CONTACT YOUR SURGEON.                                                    FOLLOW-UP APPOINTMENTS:  If you do not already have a post-op appointment, please call the office for an appointment to be seen by your surgeon.  Guidelines for how soon to be seen are listed in your "After Visit Summary", but are typically between 1-4 weeks after surgery.  OTHER INSTRUCTIONS:   Knee Replacement:  Do not place pillow under knee, focus on keeping the knee straight while resting. CPM instructions: 0-90 degrees, 2 hours in the morning, 2 hours in the afternoon, and 2 hours in the evening. Place foam block, curve side up under heel at all times except when in CPM or when walking.  DO NOT modify, tear, cut, or change the foam block in any way.  MAKE SURE YOU:  Understand these instructions.  Get help right away if you are not doing well or get worse.    Thank you for letting us be a part of your medical care team.  It is a privilege we respect greatly.  We hope these instructions will help you stay on track for a fast and full recovery!     Do not put a pillow under the knee. Place it under the heel.    Complete by:  As directed   Place gray foam block, curve side up under heel at all times except when in CPM or when walking.  DO NOT modify, tear, cut, or change in any way the gray foam block.     Increase activity slowly as tolerated    Complete by:  As directed      TED hose    Complete by:  As directed   Use stockings (TED hose) for 2 weeks on both leg(s).  You may remove them at night for sleeping.           Follow-up Information    Follow up with [facility].  Why:  Someone from [facility] will contact you to arrange start date and time for therapy.   Contact information:   [address] [place] [address] [phone]       Follow up with Lorn Junes, MD On 02/22/2015.   Specialty:  Orthopedic Surgery   Why:  appt time 10 am   Contact information:   [place] [address] [phone]        Signed: Linda Hedges 02/14/2015, 10:12 AM

## 2015-03-14 ENCOUNTER — Emergency Department (HOSPITAL_COMMUNITY): Payer: BLUE CROSS/BLUE SHIELD

## 2015-03-14 ENCOUNTER — Emergency Department (HOSPITAL_COMMUNITY)
Admission: EM | Admit: 2015-03-14 | Discharge: 2015-03-14 | Disposition: A | Discharge status: Home / Self Care | Payer: BLUE CROSS/BLUE SHIELD | Attending: Emergency Medicine | Admitting: Emergency Medicine

## 2015-03-14 ENCOUNTER — Encounter (HOSPITAL_COMMUNITY): Payer: Self-pay | Admitting: Emergency Medicine

## 2015-03-14 DIAGNOSIS — Z8619 Personal history of other infectious and parasitic diseases: Secondary | ICD-10-CM | POA: Diagnosis not present

## 2015-03-14 DIAGNOSIS — R109 Unspecified abdominal pain: Secondary | ICD-10-CM

## 2015-03-14 DIAGNOSIS — Z8669 Personal history of other diseases of the nervous system and sense organs: Secondary | ICD-10-CM | POA: Insufficient documentation

## 2015-03-14 DIAGNOSIS — N200 Calculus of kidney: Secondary | ICD-10-CM | POA: Insufficient documentation

## 2015-03-14 DIAGNOSIS — J45909 Unspecified asthma, uncomplicated: Secondary | ICD-10-CM | POA: Diagnosis not present

## 2015-03-14 DIAGNOSIS — Z79899 Other long term (current) drug therapy: Secondary | ICD-10-CM | POA: Insufficient documentation

## 2015-03-14 DIAGNOSIS — Z905 Acquired absence of kidney: Secondary | ICD-10-CM | POA: Diagnosis not present

## 2015-03-14 DIAGNOSIS — E039 Hypothyroidism, unspecified: Secondary | ICD-10-CM | POA: Insufficient documentation

## 2015-03-14 DIAGNOSIS — Z7982 Long term (current) use of aspirin: Secondary | ICD-10-CM | POA: Diagnosis not present

## 2015-03-14 DIAGNOSIS — Z7951 Long term (current) use of inhaled steroids: Secondary | ICD-10-CM | POA: Insufficient documentation

## 2015-03-14 DIAGNOSIS — F419 Anxiety disorder, unspecified: Secondary | ICD-10-CM | POA: Diagnosis not present

## 2015-03-14 DIAGNOSIS — K219 Gastro-esophageal reflux disease without esophagitis: Secondary | ICD-10-CM | POA: Insufficient documentation

## 2015-03-14 DIAGNOSIS — M170 Bilateral primary osteoarthritis of knee: Secondary | ICD-10-CM | POA: Insufficient documentation

## 2015-03-14 DIAGNOSIS — Z85528 Personal history of other malignant neoplasm of kidney: Secondary | ICD-10-CM | POA: Insufficient documentation

## 2015-03-14 DIAGNOSIS — Z9049 Acquired absence of other specified parts of digestive tract: Secondary | ICD-10-CM | POA: Diagnosis not present

## 2015-03-14 DIAGNOSIS — Z792 Long term (current) use of antibiotics: Secondary | ICD-10-CM | POA: Diagnosis not present

## 2015-03-14 LAB — COMPREHENSIVE METABOLIC PANEL
ALK PHOS: 83 U/L (ref 38–126)
ALT: 20 U/L (ref 17–63)
AST: 30 U/L (ref 15–41)
Albumin: 4.3 g/dL (ref 3.5–5.0)
Anion gap: 7 (ref 5–15)
BILIRUBIN TOTAL: 0.7 mg/dL (ref 0.3–1.2)
BUN: 21 mg/dL — ABNORMAL HIGH (ref 6–20)
CALCIUM: 9.5 mg/dL (ref 8.9–10.3)
CO2: 22 mmol/L (ref 22–32)
CREATININE: 1.17 mg/dL (ref 0.61–1.24)
Chloride: 110 mmol/L (ref 101–111)
GFR calc non Af Amer: >60 mL/min (ref >60)
Glucose, Bld: 106 mg/dL — ABNORMAL HIGH (ref 65–99)
POTASSIUM: 3.7 mmol/L (ref 3.5–5.1)
SODIUM: 139 mmol/L (ref 135–145)
Total Protein: 7.4 g/dL (ref 6.5–8.1)

## 2015-03-14 LAB — URINALYSIS, ROUTINE W REFLEX MICROSCOPIC
BILIRUBIN URINE: NEGATIVE
GLUCOSE, UA: NEGATIVE mg/dL
KETONES UR: NEGATIVE mg/dL
Leukocytes, UA: NEGATIVE
NITRITE: NEGATIVE
PH: 6 (ref 5.0–8.0)
Protein, ur: NEGATIVE mg/dL
SPECIFIC GRAVITY, URINE: 1.019 (ref 1.005–1.030)

## 2015-03-14 LAB — CBC WITH DIFFERENTIAL/PLATELET
BASOS PCT: 0 %
Basophils Absolute: 0 10*3/uL (ref 0.0–0.1)
EOS ABS: 0.4 10*3/uL (ref 0.0–0.7)
Eosinophils Relative: 4 %
HEMATOCRIT: 36.5 % — AB (ref 39.0–52.0)
HEMOGLOBIN: 12.2 g/dL — AB (ref 13.0–17.0)
LYMPHS ABS: 1.3 10*3/uL (ref 0.7–4.0)
Lymphocytes Relative: 16 %
MCH: 29.7 pg (ref 26.0–34.0)
MCHC: 33.4 g/dL (ref 30.0–36.0)
MCV: 88.8 fL (ref 78.0–100.0)
MONO ABS: 1 10*3/uL (ref 0.1–1.0)
MONOS PCT: 12 %
Neutro Abs: 5.5 10*3/uL (ref 1.7–7.7)
Neutrophils Relative %: 68 %
Platelets: 318 10*3/uL (ref 150–400)
RBC: 4.11 MIL/uL — ABNORMAL LOW (ref 4.22–5.81)
RDW: 13 % (ref 11.5–15.5)
WBC: 8.2 10*3/uL (ref 4.0–10.5)

## 2015-03-14 LAB — LIPASE, BLOOD: LIPASE: 46 U/L (ref 11–51)

## 2015-03-14 LAB — URINE MICROSCOPIC-ADD ON

## 2015-03-14 MED ORDER — MORPHINE SULFATE (PF) 4 MG/ML IV SOLN: 4.0000 mg | Freq: Once | INTRAVENOUS | Status: DC

## 2015-03-14 MED ORDER — ONDANSETRON HCL 4 MG/2ML IJ SOLN: 4.0000 mg | Freq: Once | INTRAMUSCULAR | Status: DC

## 2015-03-14 MED ORDER — SODIUM CHLORIDE 0.9 % IV BOLUS (SEPSIS): 1000.0000 mL | Freq: Once | INTRAVENOUS | Status: DC

## 2015-03-14 MED ORDER — KETOROLAC TROMETHAMINE 30 MG/ML IJ SOLN: 15.0000 mg | Freq: Once | INTRAMUSCULAR | Status: DC

## 2015-03-14 NOTE — Discharge Instructions (Signed)

## 2015-03-14 NOTE — ED Provider Notes (Signed)
CSN: [id number]     Arrival date & time 03/14/15  1843 History   First MD Initiated Contact with Patient 03/14/15 1932     Chief Complaint  Patient presents with  . Flank Pain     (Consider location/radiation/quality/duration/timing/severity/associated sxs/prior Treatment) Patient is a 64 y.o. male presenting with flank pain. The history is provided by the patient and the spouse.  Flank Pain This is a recurrent problem. The current episode started 6 to 12 hours ago. The problem occurs constantly. The problem has not changed since onset.Pertinent negatives include no chest pain, no abdominal pain, no headaches and no shortness of breath. Nothing aggravates the symptoms. The symptoms are relieved by narcotics. He has tried nothing for the symptoms. The treatment provided no relief.   64 yo M with a chief complaint of right flank pain. This been going on for the past 6 hours or so. Patient has a history of kidney stones and feels that it feels similar. Colicky pain with no radiation making him nauseated. He called his urologist who suggested they come in here for evaluation. Has a history of left-sided renal cancer with partial nephrectomy.  Past Medical History  Diagnosis Date  . Thyroid disease   . Ulcerative colitis ([place])   . History of kidney stones   . Primary localized osteoarthritis of left knee   . Sleep apnea   . Asthma     as a child   . Hypothyroidism   . Anxiety   . GERD (gastroesophageal reflux disease)   . Cancer [facility])     kidney cancer  . 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 Primary localized osteoarthritis of right knee    Past Surgical History  Procedure Laterality Date  . Appendectomy    . Tonsillectomy    . Ileostomy    . Partial nephrectomy    . Total colectomy    . Knee arthroscopy Left 01/01/2015    Procedure: ARTHROSCOPY KNEE;  Surgeon: Elsie Saas, MD;  Location: [place];  Service: Orthopedics;  Laterality: Left;  . Total knee  arthroplasty Left 01/01/2015    Procedure: TOTAL KNEE ARTHROPLASTY;  Surgeon: Elsie Saas, MD;  Location: [place];  Service: Orthopedics;  Laterality: Left;  . Total knee arthroplasty Right 02/12/2015    Procedure: TOTAL RIGHT KNEE ARTHROPLASTY;  Surgeon: Elsie Saas, MD;  Location: [place];  Service: Orthopedics;  Laterality: Right;   Family History  Problem Relation Age of Onset  . Cancer Father     thyroid   Social History  Substance Use Topics  . Smoking status: Never Smoker   . Smokeless tobacco: None  . Alcohol Use: No    Review of Systems  Constitutional: Negative for fever and chills.  HENT: Negative for congestion and facial swelling.   Eyes: Negative for discharge and visual disturbance.  Respiratory: Negative for shortness of breath.   Cardiovascular: Negative for chest pain and palpitations.  Gastrointestinal: Negative for vomiting, abdominal pain and diarrhea.  Genitourinary: Positive for flank pain (R sided).  Musculoskeletal: Negative for myalgias and arthralgias.  Skin: Negative for color change and rash.  Neurological: Negative for tremors, syncope and headaches.  Psychiatric/Behavioral: Negative for confusion and dysphoric mood.      Allergies  Review of patient's allergies indicates no known allergies.  Home Medications   Prior to Admission medications   Medication Sig Start Date End Date Taking? Authorizing Provider  ALPRAZolam (XANAX) 0.25 MG tablet Take 0.25 mg by mouth 3 (three) times  daily as needed for anxiety.   Yes Historical Provider, MD  aspirin 325 MG EC tablet 1 tab a day for the next 30 days to prevent blood clots 02/14/15  Yes Kirstin Shepperson, PA-C  celecoxib (CELEBREX) 200 MG capsule 1 tab po q day with food for pain and  swelling Patient taking differently: Take 200 mg by mouth daily.  01/04/15  Yes Kirstin Shepperson, PA-C  docusate sodium (COLACE) 100 MG capsule 1 tab 2 times a day while on narcotics.  STOOL SOFTENER 01/04/15  Yes Kirstin  Shepperson, PA-C  doxycycline (VIBRA-TABS) 100 MG tablet Take 1 tablet (100 mg total) by mouth every 12 (twelve) hours. 02/14/15  Yes Kirstin Shepperson, PA-C  DULoxetine (CYMBALTA) 30 MG capsule Take 30 mg by mouth at bedtime.   Yes Historical Provider, MD  Fluticasone-Salmeterol (ADVAIR) 250-50 MCG/DOSE AEPB Inhale 1 puff into the lungs daily.    Yes Historical Provider, MD  levothyroxine (SYNTHROID, LEVOTHROID) 175 MCG tablet Take 175 mcg by mouth daily before breakfast.   Yes Historical Provider, MD  Multiple Vitamin (MULTIVITAMIN WITH MINERALS) TABS Take 1 tablet by mouth daily.   Yes Historical Provider, MD  omeprazole (PRILOSEC OTC) 20 MG tablet Take 20 mg by mouth daily.   Yes Historical Provider, MD  oxyCODONE (OXY IR/ROXICODONE) 5 MG immediate release tablet 1-2 tablets every 4-6 hrs as needed for pain 02/14/15  Yes Kirstin Shepperson, PA-C  polyethylene glycol (MIRALAX / GLYCOLAX) packet 17grams in 16 oz of water twice a day until bowel movement.  LAXITIVE.  Restart if two days since last bowel movement 01/04/15  Yes Kirstin Shepperson, PA-C  oxyCODONE (OXYCONTIN) 20 mg 12 hr tablet Take 1 tablet (20 mg total) by mouth every 12 (twelve) hours. Patient not taking: Reported on 03/14/2015 02/14/15   Kirstin Shepperson, PA-C   BP 141/92 mmHg  Pulse 72  Temp(Src) 97.5 F (36.4 C) (Oral)  Resp 18  SpO2 100% Physical Exam  Constitutional: He is oriented to person, place, and time. He appears well-developed and well-nourished.  HENT:  Head: Normocephalic and atraumatic.  Eyes: EOM are normal. Pupils are equal, round, and reactive to light.  Neck: Normal range of motion. Neck supple. No JVD present.  Cardiovascular: Normal rate and regular rhythm.  Exam reveals no gallop and no friction rub.   No murmur heard. Pulmonary/Chest: No respiratory distress. He has no wheezes.  Abdominal: He exhibits no distension. There is no rebound and no guarding.  Musculoskeletal: Normal range of motion. He  exhibits tenderness (mild R flank pain).  Neurological: He is alert and oriented to person, place, and time.  Skin: No rash noted. No pallor.  Psychiatric: He has a normal mood and affect. His behavior is normal.  Nursing note and vitals reviewed.   ED Course  Procedures (including critical care time) Labs Review Labs Reviewed  URINALYSIS, ROUTINE W REFLEX MICROSCOPIC (NOT AT [facility]) - Abnormal; Notable for the following:    APPearance HAZY (*)    Hgb urine dipstick LARGE (*)    All other components within normal limits  CBC WITH DIFFERENTIAL/PLATELET - Abnormal; Notable for the following:    RBC 4.11 (*)    Hemoglobin 12.2 (*)    HCT 36.5 (*)    All other components within normal limits  COMPREHENSIVE METABOLIC PANEL - Abnormal; Notable for the following:    Glucose, Bld 106 (*)    BUN 21 (*)    All other components within normal limits  URINE MICROSCOPIC-ADD ON - Abnormal;  Notable for the following:    Squamous Epithelial / LPF 0-5 (*)    Bacteria, UA RARE (*)    All other components within normal limits  LIPASE, BLOOD    Imaging Review Ct Renal Stone Study  03/14/2015  CLINICAL DATA:  Acute onset right flank pain today. Nephrolithiasis. Previous partial left nephrectomy for renal cell carcinoma. Previous total colectomy for ulcerative colitis. EXAM: CT ABDOMEN AND PELVIS WITHOUT CONTRAST TECHNIQUE: Multidetector CT imaging of the abdomen and pelvis was performed following the standard protocol without IV contrast. COMPARISON:  10/17/2014 and 10/28/2012 FINDINGS: Lower chest:  No acute findings. Hepatobiliary: No mass visualized on this un-enhanced exam. Pancreas: No mass or inflammatory process identified on this un-enhanced exam. Spleen: Within normal limits in size. Adrenals/Urinary Tract: Mild to moderate right hydronephrosis seen due to 4 mm calculus in proximal right ureter. Several tiny nonobstructive calculi noted in right kidney. No evidence of left ureteral calculi or  hydronephrosis. Surgical clips again seen along the posterior margin of midpole the left kidney. Stomach/Bowel: Previous total colectomy and right lower quadrant ileostomy again seen. Pre acral soft tissue density is stable consistent with postop changes. Vascular/Lymphatic: No pathologically enlarged lymph nodes. No evidence of abdominal aortic aneurysm. Reproductive: No mass or other significant abnormality. Other: None. Musculoskeletal:  No suspicious bone lesions identified. IMPRESSION: 4 mm proximal right ureteral calculus causing mild to moderate right hydronephrosis. Tiny nonobstructive right intrarenal calculi also noted. Electronically Signed   By: Earle Gell M.D.   On: 03/14/2015 20:18   I have personally reviewed and evaluated these images and lab results as part of my medical decision-making.   EKG Interpretation None      MDM   Final diagnoses:  Flank pain  Nephrolithiasis    64 yo M with a chief complaints of right flank pain. This feels like patient's prior kidney stone. CT scan consistent with a 4 mm proximal right stone. Has mild to moderate hydro. With patient's history of left-sided nephrectomy the case was discussed with urology, Dr. Alinda Money, feel patient safe for D/c, recommend calling his urologist in morning. Offered pain medicine for home, patient with significant narcotic stash post knee surgery, feels he has ample.   11:17 PM:  I have discussed the diagnosis/risks/treatment options with the patient and family and believe the pt to be eligible for discharge home to follow-up with PCP. We also discussed returning to the ED immediately if new or worsening sx occur. We discussed the sx which are most concerning (e.g., sudden worsening pain, fever, inability to tolerate by mouth) that necessitate immediate return. Medications administered to the patient during their visit and any new prescriptions provided to the patient are listed below.  Medications given during this  visit Medications  morphine 4 MG/ML injection 4 mg (4 mg Intravenous Not Given 03/14/15 2138)  ondansetron [facility]) injection 4 mg (4 mg Intravenous Not Given 03/14/15 2138)  sodium chloride 0.9 % bolus 1,000 mL (1,000 mLs Intravenous Not Given 03/14/15 2138)    Discharge Medication List as of 03/14/2015  9:23 PM      The patient appears reasonably screen and/or stabilized for discharge and I doubt any other medical condition or other [facility] requiring further screening, evaluation, or treatment in the ED at this time prior to discharge.      Deno Etienne, DO 03/14/15 2318

## 2015-03-14 NOTE — ED Notes (Signed)
Pt currently does not want any IV medications.

## 2015-03-14 NOTE — ED Notes (Signed)
Patient transported to CT 

## 2015-03-14 NOTE — ED Notes (Signed)
Pt c/o right flank pain that has gotten worse today. Pt has PMH kidney cancer in left kidney and not fully functioning.  Pt states that he has had 3 kidney stones before and symptoms started out similar.  Pt denies any urinary problems at this time.  Pt is on pain meds for recent knee surgeries.

## 2015-03-29 ENCOUNTER — Other Ambulatory Visit (HOSPITAL_COMMUNITY): Payer: Self-pay | Admitting: Urology

## 2015-03-29 ENCOUNTER — Ambulatory Visit (HOSPITAL_COMMUNITY)
Admission: RE | Admit: 2015-03-29 | Discharge: 2015-03-29 | Disposition: A | Discharge status: Home / Self Care | Payer: BLUE CROSS/BLUE SHIELD | Source: Ambulatory Visit | Attending: Urology | Admitting: Urology

## 2015-03-29 DIAGNOSIS — N201 Calculus of ureter: Secondary | ICD-10-CM

## 2015-03-30 ENCOUNTER — Other Ambulatory Visit: Payer: Self-pay | Admitting: Urology

## 2015-03-30 ENCOUNTER — Encounter (HOSPITAL_BASED_OUTPATIENT_CLINIC_OR_DEPARTMENT_OTHER): Payer: Self-pay | Admitting: *Deleted

## 2015-03-30 NOTE — Progress Notes (Signed)
NPO AFTER MN.  ARRIVE AT 0915. NEEDS KUB. CURRENT LAB RESULTS IN CHART AND EPIC. WILL TAKE SYNTHROID AND PRILOSEC AM DOS W/ SIPS OF WATER.

## 2015-04-06 ENCOUNTER — Encounter (HOSPITAL_BASED_OUTPATIENT_CLINIC_OR_DEPARTMENT_OTHER): Payer: Self-pay | Admitting: Anesthesiology

## 2015-04-06 ENCOUNTER — Encounter (HOSPITAL_BASED_OUTPATIENT_CLINIC_OR_DEPARTMENT_OTHER): Payer: Self-pay | Admitting: *Deleted

## 2015-04-06 ENCOUNTER — Ambulatory Visit (HOSPITAL_COMMUNITY): Payer: BLUE CROSS/BLUE SHIELD

## 2015-04-06 ENCOUNTER — Ambulatory Visit (HOSPITAL_BASED_OUTPATIENT_CLINIC_OR_DEPARTMENT_OTHER)
Admission: RE | Admit: 2015-04-06 | Discharge: 2015-04-06 | Disposition: A | Discharge status: Home / Self Care | Payer: BLUE CROSS/BLUE SHIELD | Source: Ambulatory Visit | Attending: Urology | Admitting: Urology

## 2015-04-06 ENCOUNTER — Encounter (HOSPITAL_BASED_OUTPATIENT_CLINIC_OR_DEPARTMENT_OTHER)
Admission: RE | Disposition: A | Discharge status: Home / Self Care | Payer: Self-pay | Source: Ambulatory Visit | Attending: Urology

## 2015-04-06 DIAGNOSIS — G4730 Sleep apnea, unspecified: Secondary | ICD-10-CM | POA: Insufficient documentation

## 2015-04-06 DIAGNOSIS — Z841 Family history of disorders of kidney and ureter: Secondary | ICD-10-CM | POA: Insufficient documentation

## 2015-04-06 DIAGNOSIS — Z932 Ileostomy status: Secondary | ICD-10-CM | POA: Insufficient documentation

## 2015-04-06 DIAGNOSIS — Z85528 Personal history of other malignant neoplasm of kidney: Secondary | ICD-10-CM | POA: Insufficient documentation

## 2015-04-06 DIAGNOSIS — Z538 Procedure and treatment not carried out for other reasons: Secondary | ICD-10-CM | POA: Insufficient documentation

## 2015-04-06 DIAGNOSIS — Z905 Acquired absence of kidney: Secondary | ICD-10-CM | POA: Insufficient documentation

## 2015-04-06 DIAGNOSIS — J45909 Unspecified asthma, uncomplicated: Secondary | ICD-10-CM | POA: Insufficient documentation

## 2015-04-06 DIAGNOSIS — F329 Major depressive disorder, single episode, unspecified: Secondary | ICD-10-CM | POA: Insufficient documentation

## 2015-04-06 DIAGNOSIS — Z87442 Personal history of urinary calculi: Secondary | ICD-10-CM | POA: Diagnosis not present

## 2015-04-06 DIAGNOSIS — Z7982 Long term (current) use of aspirin: Secondary | ICD-10-CM | POA: Insufficient documentation

## 2015-04-06 DIAGNOSIS — Z79891 Long term (current) use of opiate analgesic: Secondary | ICD-10-CM | POA: Diagnosis not present

## 2015-04-06 DIAGNOSIS — Z79899 Other long term (current) drug therapy: Secondary | ICD-10-CM | POA: Diagnosis not present

## 2015-04-06 DIAGNOSIS — N201 Calculus of ureter: Secondary | ICD-10-CM

## 2015-04-06 HISTORY — DX: Ileostomy status: Z93.2

## 2015-04-06 HISTORY — DX: Personal history of other malignant neoplasm of kidney: Z85.528

## 2015-04-06 HISTORY — DX: Personal history of other diseases of the respiratory system: Z87.09

## 2015-04-06 HISTORY — DX: Calculus of kidney: N20.0

## 2015-04-06 HISTORY — DX: Dependence on other enabling machines and devices: Z99.89

## 2015-04-06 HISTORY — DX: Calculus of ureter: N20.1

## 2015-04-06 HISTORY — DX: Personal history of other diseases of the digestive system: Z87.19

## 2015-04-06 HISTORY — DX: Osteoarthritis of knee, unspecified: M17.9

## 2015-04-06 HISTORY — DX: Obstructive sleep apnea (adult) (pediatric): G47.33

## 2015-04-06 HISTORY — DX: Unilateral primary osteoarthritis, unspecified knee: M17.10

## 2015-04-06 SURGERY — CYSTOURETEROSCOPY, WITH RETROGRADE PYELOGRAM AND STENT INSERTION
Anesthesia: General

## 2015-04-06 MED ORDER — CIPROFLOXACIN IN D5W 200 MG/100ML IV SOLN
200.0000 mg | INTRAVENOUS | Status: DC
  Filled 2015-04-06: qty 100

## 2015-04-06 MED ORDER — LACTATED RINGERS IV SOLN
INTRAVENOUS | Status: DC
  Filled 2015-04-06: qty 1000

## 2015-04-06 SURGICAL SUPPLY — 32 items
BAG DRAIN URO-CYSTO SKYTR STRL (DRAIN) ×1 IMPLANT
BAG DRN UROCATH (DRAIN)
BASKET DAKOTA 1.9FR 11X120 (BASKET) IMPLANT
BASKET LASER NITINOL 1.9FR (BASKET) IMPLANT
BASKET STNLS GEMINI 4WIRE 3FR (BASKET) IMPLANT
BASKET ZERO TIP NITINOL 2.4FR (BASKET) IMPLANT
BSKT STON RTRVL 120 1.9FR (BASKET)
BSKT STON RTRVL GEM 120X11 3FR (BASKET)
BSKT STON RTRVL ZERO TP 2.4FR (BASKET)
CANISTER SUCT LVC 12 LTR MEDI- (MISCELLANEOUS) ×1 IMPLANT
CATH URET 5FR 28IN OPEN ENDED (CATHETERS) ×1 IMPLANT
CATH URET DUAL LUMEN 6-10FR 50 (CATHETERS) IMPLANT
CLOTH BEACON ORANGE TIMEOUT ST (SAFETY) ×1 IMPLANT
FIBER LASER TRAC TIP (UROLOGICAL SUPPLIES) IMPLANT
GLOVE BIO SURGEON STRL SZ7.5 (GLOVE) ×1 IMPLANT
GOWN STRL REUS W/ TWL XL LVL3 (GOWN DISPOSABLE) ×1 IMPLANT
GOWN STRL REUS W/TWL XL LVL3 (GOWN DISPOSABLE)
GUIDEWIRE 0.038 PTFE COATED (WIRE) IMPLANT
GUIDEWIRE ANG ZIPWIRE 038X150 (WIRE) IMPLANT
GUIDEWIRE STR DUAL SENSOR (WIRE) ×1 IMPLANT
IV NS IRRIG 3000ML ARTHROMATIC (IV SOLUTION) ×2 IMPLANT
KIT BALLIN UROMAX 15FX10 (LABEL) IMPLANT
KIT BALLN UROMAX 15FX4 (MISCELLANEOUS) IMPLANT
KIT BALLN UROMAX 26 75X4 (MISCELLANEOUS)
KIT ROOM TURNOVER WOR (KITS) ×1 IMPLANT
MANIFOLD NEPTUNE II (INSTRUMENTS) IMPLANT
NS IRRIG 500ML POUR BTL (IV SOLUTION) IMPLANT
PACK CYSTO (CUSTOM PROCEDURE TRAY) ×1 IMPLANT
SET HIGH PRES BAL DIL (LABEL)
SHEATH ACCESS URETERAL 38CM (SHEATH) IMPLANT
TUBE CONNECTING 12X1/4 (SUCTIONS) IMPLANT
TUBE FEEDING 8FR 16IN STR KANG (MISCELLANEOUS) IMPLANT

## 2015-04-06 NOTE — Op Note (Signed)
Case cancelled.  Patient passed his stone.

## 2015-04-06 NOTE — H&P (Signed)
Reason For Visit David [name] is a 64 year old male with a history of a left ureteral stone.   History of Present Illness Renal cell carcinoma: He underwent a left partial nephrectomy for pT1a papillary renal cell carcinoma in 9/03. He has remained free of recurrence since that time.         Nephrolithiasis: A left ureteral stone was treated ureteroscopically in 11/04. He developed a right ureteral stone in 6/08 and because he had a single renal unit this required the placement of the double-J stent. The stone was treated with lithotripsy with good result. A CT scan done in 10/09 revealed a 2 mm right renal calculus and a punctate, 1 mm stone in the left kidney.  CT scan 03/14/15: Single minute calcification in the lower pole of his right kidney as well as a 4 mm stone in the mid right ureter with Hounsfield units of 750.      Stone analysis: Calcium oxalate.      Family history prostate cancer: His father died of widely metastatic prostate cancer and his brother also developed prostate cancer. In addition his older brother was also diagnosed with prostate cancer. He has had a previous AP resection for ulcerative colitis and therefore has no rectum and therefore DRE cannot be performed.    Elevated PSA: He was found to have a PSA of 12.89 in 6/15 with a history of having a PSA elevation to 4.1 in 11/12 which fell after antibiotic therapy.  4K score 6/15: 49% probability of a biopsy revealing high-grade cancer.  [facility] referral: He underwent an MRI scan and then underwent CT-guided biopsy of the prostate. He reported that there was no evidence of cancer and he believes some atypia was found and currently this is being monitored with serial PSAs.     Interval history: He was diagnosed with a 4 mm stone (750HU) in his left ureter on 03/14/15. He has been maintained on medical expulsive therapy. I could not see his stone on KUB at his last visit and his urine was clear. He reports he is not having  any pain but he has been using his strainer and indicates he has been 100% compliant and has not seen a stone pass. We discussed the options which would be to obtain a KUB although his stone was not visible on his last KUB and if I cannot see it on a KUB that does not guarantee that it has passed. The other option would be to obtain a CT scan. This would allow me to determine 100% whether the stone was present or not and if it was could be scheduled for surgery for its removal.      Past Medical History Problems  1. History of Asthma (J45.909) 2. History of depression (Z86.59) 3. History of sleep apnea (Z87.09) 4. History of ulcerative colitis (Z87.19) 5. Personal history of malignant neoplasm of kidney (V76.160)  Surgical History Problems  1. History of Appendectomy 2. History of Cystoscopy With Insertion Of Ureteral Stent Right 3. History of Cystoscopy With Ureteroscopy With Removal Of Calculus 4. History of Ileostomy 5. History of Knee Surgery Left 6. History of Knee Surgery Right 7. History of Radical Nephrectomy 8. History of Renal Lithotripsy  Current Meds 1. Aspirin 81 MG TABS;  Therapy: (Recorded:15Dec2016) to Recorded 2. CeleBREX 200 MG Oral Capsule;  Therapy: (Recorded:15Dec2016) to Recorded 3. Cymbalta CPEP;  Therapy: (Recorded:23May2008) to Recorded 4. Multi Vitamin/Minerals TABS;  Therapy: (Recorded:15Nov2012) to Recorded 5. Omeprazole 20 MG Oral Capsule Delayed  Release;  Therapy: (Recorded:15Dec2016) to Recorded 6. OxyCODONE HCl - 20 MG Oral Tablet;  Therapy: (Recorded:15Dec2016) to Recorded 7. Synthroid 175 MCG Oral Tablet;  Therapy: [id number] to Recorded 8. Tamsulosin HCl - 0.4 MG Oral Capsule; TAKE 1 CAPSULE Daily;  Therapy: [id number] to (Evaluate:07Jan2017)  Requested for: [id number]; Last  Rx:08Dec2016 Ordered  Allergies Medication  1. No Known Drug Allergies  Family History Problems  1. Family history of Family Health Status Number Of Children   2  sons and 3 daughters 2. Family history of malignant neoplasm (Z80.9) : Father 3. Family history of Prostate Cancer 4. Family history of Urologic Disorder   kidney stones  Social History Problems  1. Denied: Alcohol Use (History) 2. Family history of Death In The Family Father   49 3. Marital History - Currently Married 4. Never A Smoker 5. Occupation:   business 6. Denied: Tobacco Use     Review of Systems Genitourinary and gastrointestinal system(s) were reviewed and pertinent findings if present are noted and are otherwise negative.    Vitals Vital Signs [Data Includes: Last 1 Day]  Recorded: 22Dec2016 04:12PM  Height: 5 ft 8 in Weight: 185 lb  BMI Calculated: 28.13 BSA Calculated: 1.98 Blood Pressure: 157 / 95 Heart Rate: 86 Recorded: 15Dec2016 02:25PM  Blood Pressure: 127 / 81 Heart Rate: 79  Results/Data  The following images/tracing/specimen were independently visualized:  CT scan as below.    Assessment Assessed  1. Ureteral calculus, right (N20.1)  I reviewed his CT scan images myself and then reviewed them with him. His stone has progressed but it is still in the ureter. It is now located just above the ureterovesical junction. It is on the right side and is linear. He is going to continue to strain his urine and remain on medical expulsive therapy but has met his deductible and desires to have the stone treated. We therefore discussed proceeding with ureteroscopy as I think this is the most appropriate manner to treat his stone in its current location and I therefore went over the procedure with him in detail. We discussed how the stone would be accessed with the ureteroscope, the probability of success, the possibility of the need for a stent versus a possibility that a stent might not be necessary, the outpatient nature of the procedure as well as the anticipated postoperative course.

## 2015-04-06 NOTE — Progress Notes (Signed)
Patient sent for kub before surgery.  Dr.Herrick spoke with the patient and told him that he thought it would be better to not do surgery and attempt to pass the stone on his own.  Patient decided to follow that suggestion.

## 2015-04-06 NOTE — Anesthesia Preprocedure Evaluation (Deleted)
Anesthesia Evaluation  Patient identified by MRN, date of birth, ID band Patient awake    Reviewed: Allergy & Precautions, NPO status , Patient's Chart, lab work & pertinent test results  History of Anesthesia Complications Negative for: history of anesthetic complications  Airway Mallampati: II  TM Distance: >3 FB Neck ROM: Full    Dental no notable dental hx. (+) Dental Advisory Given   Pulmonary sleep apnea ,    Pulmonary exam normal breath sounds clear to auscultation       Cardiovascular negative cardio ROS Normal cardiovascular exam Rhythm:Regular Rate:Normal     Neuro/Psych PSYCHIATRIC DISORDERS Anxiety negative neurological ROS     GI/Hepatic Neg liver ROS, GERD  Medicated and Controlled,  Endo/Other  Hypothyroidism   Renal/GU negative Renal ROS  negative genitourinary   Musculoskeletal  (+) Arthritis ,   Abdominal   Peds negative pediatric ROS (+)  Hematology negative hematology ROS (+)   Anesthesia Other Findings   Reproductive/Obstetrics negative OB ROS                             Anesthesia Physical Anesthesia Plan  ASA: II  Anesthesia Plan: General   Post-op Pain Management:    Induction: Intravenous  Airway Management Planned: LMA  Additional Equipment:   Intra-op Plan:   Post-operative Plan: Extubation in OR  Informed Consent: I have reviewed the patients History and Physical, chart, labs and discussed the procedure including the risks, benefits and alternatives for the proposed anesthesia with the patient or authorized representative who has indicated his/her understanding and acceptance.   Dental advisory given  Plan Discussed with: CRNA  Anesthesia Plan Comments:         Anesthesia Quick Evaluation

## 2015-09-20 ENCOUNTER — Emergency Department (HOSPITAL_COMMUNITY)
Admission: EM | Admit: 2015-09-20 | Discharge: 2015-09-20 | Disposition: A | Discharge status: Home / Self Care | Payer: BLUE CROSS/BLUE SHIELD | Attending: Emergency Medicine | Admitting: Emergency Medicine

## 2015-09-20 ENCOUNTER — Encounter (HOSPITAL_COMMUNITY): Payer: Self-pay | Admitting: Emergency Medicine

## 2015-09-20 ENCOUNTER — Emergency Department (HOSPITAL_COMMUNITY): Payer: BLUE CROSS/BLUE SHIELD

## 2015-09-20 DIAGNOSIS — Z85528 Personal history of other malignant neoplasm of kidney: Secondary | ICD-10-CM | POA: Insufficient documentation

## 2015-09-20 DIAGNOSIS — IMO0002 Reserved for concepts with insufficient information to code with codable children: Secondary | ICD-10-CM

## 2015-09-20 DIAGNOSIS — Y999 Unspecified external cause status: Secondary | ICD-10-CM | POA: Diagnosis not present

## 2015-09-20 DIAGNOSIS — Z7982 Long term (current) use of aspirin: Secondary | ICD-10-CM | POA: Diagnosis not present

## 2015-09-20 DIAGNOSIS — S61210A Laceration without foreign body of right index finger without damage to nail, initial encounter: Secondary | ICD-10-CM | POA: Insufficient documentation

## 2015-09-20 DIAGNOSIS — Z79899 Other long term (current) drug therapy: Secondary | ICD-10-CM | POA: Diagnosis not present

## 2015-09-20 DIAGNOSIS — S61411A Laceration without foreign body of right hand, initial encounter: Secondary | ICD-10-CM | POA: Diagnosis present

## 2015-09-20 DIAGNOSIS — Y288XXA Contact with other sharp object, undetermined intent, initial encounter: Secondary | ICD-10-CM | POA: Insufficient documentation

## 2015-09-20 DIAGNOSIS — Y929 Unspecified place or not applicable: Secondary | ICD-10-CM | POA: Diagnosis not present

## 2015-09-20 DIAGNOSIS — Y939 Activity, unspecified: Secondary | ICD-10-CM | POA: Diagnosis not present

## 2015-09-20 MED ORDER — CEPHALEXIN 500 MG PO CAPS
500.0000 mg | ORAL_CAPSULE | Freq: Once | ORAL | Status: AC
  Administered 2015-09-20: 500 mg via ORAL
  Filled 2015-09-20: qty 1

## 2015-09-20 MED ORDER — CEPHALEXIN 500 MG PO CAPS: 500.0000 mg | ORAL_CAPSULE | Freq: Two times a day (BID) | ORAL | Status: DC

## 2015-09-20 NOTE — ED Notes (Signed)
Pt presents with laceration to R dorsal index by saw at home @ 1800, pt sent from urgent care with wound dressed with reports of exposed tendon. PA at bedside, bleeding controlled, pt able to flex and extend finger, +CMS. Pt reports pain 1/10. Last tetanus 2-3 years ago.

## 2015-09-20 NOTE — ED Provider Notes (Signed)
CSN: [id number]     Arrival date & time 09/20/15  1859 History   First MD Initiated Contact with Patient 09/20/15 1916     Chief Complaint  Patient presents with  . Laceration     (Consider location/radiation/quality/duration/timing/severity/associated sxs/prior Treatment) HPI   Patient presents with laceration to the right hand. Patient reports using 8 minor saw when the blade cut the dorsal aspect of his second right finger. Bleeding controlled with direct pressure. He was seen in urgent care and instructed follow-up in the emergency room. Tetanus up-to-date, full active range of motion of the hand and fingers. Distal sensation intact. Right-handed  Past Medical History  Diagnosis Date  . History of kidney stones   . Hypothyroidism   . Anxiety   . GERD (gastroesophageal reflux disease)   . OA (osteoarthritis) of knee     both  . Personal history of asthma     childhood  . History of small bowel obstruction     recurrent  . History of ulcerative colitis     s/p total colectomy 1981  . S/P ileostomy ([facility])   . Right ureteral stone   . Right nephrolithiasis     non-obstuctive per ct 03-14-2015  . 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 History of renal cell carcinoma     s/p  left partial nephrectomy  . OSA on CPAP    Past Surgical History  Procedure Laterality Date  . Partial nephrectomy Left 2003  . Knee arthroscopy Left 01/01/2015    Procedure: ARTHROSCOPY KNEE;  Surgeon: Elsie Saas, MD;  Location: [place];  Service: Orthopedics;  Laterality: Left;  . Total knee arthroplasty Left 01/01/2015    Procedure: TOTAL KNEE ARTHROPLASTY;  Surgeon: Elsie Saas, MD;  Location: [place];  Service: Orthopedics;  Laterality: Left;  . Total knee arthroplasty Right 02/12/2015    Procedure: TOTAL RIGHT KNEE ARTHROPLASTY;  Surgeon: Elsie Saas, MD;  Location: [place];  Service: Orthopedics;  Laterality: Right;  . Cysto/  right retrograde pyelogram/  right stent placement   09-23-2006  . Appendectomy  1981  . Tonsillectomy  1990's  . Total proctocolectomy w/ end ileostomy  1981   Family History  Problem Relation Age of Onset  . Cancer Father     thyroid   Social History  Substance Use Topics  . Smoking status: Never Smoker   . Smokeless tobacco: Never Used  . Alcohol Use: No    Review of Systems  All other systems reviewed and are negative.  Allergies  Review of patient's allergies indicates no known allergies.  Home Medications   Prior to Admission medications   Medication Sig Start Date End Date Taking? Authorizing Provider  ALPRAZolam (XANAX) 0.25 MG tablet Take 0.25 mg by mouth 3 (three) times daily as needed for anxiety.    Historical Provider, MD  aspirin EC 81 MG tablet Take 81 mg by mouth daily.    Historical Provider, MD  celecoxib (CELEBREX) 200 MG capsule 1 tab po q day with food for pain and  swelling Patient taking differently: Take 200 mg by mouth every evening.  01/04/15   Kirstin Shepperson, PA-C  cephALEXin (KEFLEX) 500 MG capsule Take 1 capsule (500 mg total) by mouth 2 (two) times daily. 09/20/15   Okey Regal, PA-C  docusate sodium (COLACE) 100 MG capsule 1 tab 2 times a day while on narcotics.  STOOL SOFTENER Patient taking differently: Take 100 mg by mouth 2 (two) times daily as needed. 1 tab  2 times a day while on narcotics.  STOOL SOFTENER 01/04/15   Kirstin Shepperson, PA-C  DULoxetine (CYMBALTA) 30 MG capsule Take 30 mg by mouth at bedtime.    Historical Provider, MD  Fluticasone-Salmeterol (ADVAIR) 250-50 MCG/DOSE AEPB Inhale 1 puff into the lungs every other day.     Historical Provider, MD  levothyroxine (SYNTHROID, LEVOTHROID) 175 MCG tablet Take 175 mcg by mouth daily before breakfast.    Historical Provider, MD  Multiple Vitamin (MULTIVITAMIN WITH MINERALS) TABS Take 1 tablet by mouth daily.    Historical Provider, MD  omeprazole (PRILOSEC OTC) 20 MG tablet Take 20 mg by mouth every morning.     Historical Provider,  MD  oxyCODONE (OXY IR/ROXICODONE) 5 MG immediate release tablet 1-2 tablets every 4-6 hrs as needed for pain 02/14/15   Kirstin Shepperson, PA-C  polyethylene glycol (MIRALAX / GLYCOLAX) packet 17grams in 16 oz of water twice a day until bowel movement.  LAXITIVE.  Restart if two days since last bowel movement Patient taking differently: as needed. 17grams in 16 oz of water twice a day until bowel movement.  LAXITIVE.  Restart if two days since last bowel movement 01/04/15   Kirstin Shepperson, PA-C  tamsulosin (FLOMAX) 0.4 MG CAPS capsule Take 0.4 mg by mouth daily after supper.    Historical Provider, MD   BP 147/95 mmHg  Pulse 75  Temp(Src) 98.2 F (36.8 C) (Oral)  Resp 15  Ht 5\' 8"  (1.727 m)  Wt 81.647 kg  BMI 27.38 kg/m2  SpO2 100% Physical Exam  Constitutional: He is oriented to person, place, and time. He appears well-developed and well-nourished.  HENT:  Head: Normocephalic and atraumatic.  Eyes: Conjunctivae are normal. Pupils are equal, round, and reactive to light. Right eye exhibits no discharge. Left eye exhibits no discharge. No scleral icterus.  Neck: Normal range of motion. No JVD present. No tracheal deviation present.  Pulmonary/Chest: Effort normal. No stridor.  Musculoskeletal:  1.5 cm laceration to the dorsal aspect of the right second digit. This is linear, does not cross the joint. Partial tendon laceration, full active flexion and extension of the finger at all joints against resistance, sensation intact, no major neuro vessel injury  Neurological: He is alert and oriented to person, place, and time. Coordination normal.  Psychiatric: He has a normal mood and affect. His behavior is normal. Judgment and thought content normal.  Nursing note and vitals reviewed.   ED Course  Procedures (including critical care time)  LACERATION REPAIR Performed by: Elmer Ramp Authorized by: Elmer Ramp Consent: Verbal consent obtained. Risks and benefits:  risks, benefits and alternatives were discussed Consent given by: patient Patient identity confirmed: provided demographic data Prepped and Draped in normal sterile fashion Wound explored  Laceration Location: Dorsal aspect of right pointer finger  Laceration Length: 1.5 cm  No Foreign Bodies seen or palpated  Anesthesia: local infiltration  Local anesthetic: lidocaine 1% % 0 epinephrine  Anesthetic total: 3 ml  Irrigation method: syringe Amount of cleaning: standard  Skin closure: Simple   Number of sutures: 6   Technique: Simple interrupted   Patient tolerance: Patient tolerated the procedure well with no immediate complications. Labs Review Labs Reviewed - No data to display  Imaging Review Dg Finger Index Right  09/20/2015  CLINICAL DATA:  Laceration to the right second finger with a power small today. Exposed tendon. EXAM: RIGHT INDEX FINGER 2+V COMPARISON:  None. FINDINGS: Soft tissue injury to the proximal aspect right second finger. No radiopaque  soft tissue foreign bodies. Degenerative changes in the interphalangeal joints. No evidence of acute fracture or dislocation. Bone cortex appears intact. IMPRESSION: Soft tissue injury to the proximal aspect right second finger. No acute bony abnormalities. Electronically Signed   By: Lucienne Capers M.D.   On: 09/20/2015 21:31   I have personally reviewed and evaluated these images and lab results as part of my medical decision-making.   EKG Interpretation None      MDM   Final diagnoses:  Laceration    Labs:  Imaging:DG finger  Consults:  Therapeutics: Keflex  Discharge Meds: Keflex  Assessment/Plan: Patient presents with laceration to his right hand. Partial tendon laceration. Range of motion intact against resistance, no sensory deficits, no major vessel involvement. Dr. Fredna Dow was consult at, he instructed in wound closed, patient follow up tomorrow for further evaluation, antibiotics. Patient given  wound care instructions, follow-up information, strict return precautions.         Okey Regal, PA-C 09/20/15 2146  Lajean Saver, MD 09/20/15 [phone]

## 2015-09-20 NOTE — ED Notes (Signed)
Patient was cutting wood with power saw when cut right index finger. Patient went to urgent care and was told that laceration down to tendon so they wrapped with gauze and coban and referred patient to ED for further evaluation.

## 2015-09-20 NOTE — ED Notes (Signed)
Patient was alert, oriented and stable upon discharge. RN went over AVS and patient had no further questions.  

## 2015-09-20 NOTE — Discharge Instructions (Signed)
Please follow-up with Dr. Fredna Dow tomorrow morning for further evaluation and management.

## 2017-05-12 DIAGNOSIS — E291 Testicular hypofunction: Secondary | ICD-10-CM | POA: Diagnosis not present

## 2017-05-14 ENCOUNTER — Ambulatory Visit: Payer: Medicare HMO | Admitting: Neurology

## 2017-05-14 ENCOUNTER — Encounter (INDEPENDENT_AMBULATORY_CARE_PROVIDER_SITE_OTHER): Payer: Self-pay

## 2017-05-14 ENCOUNTER — Encounter: Payer: Self-pay | Admitting: Neurology

## 2017-05-14 VITALS — BP 154/105 | HR 66 | Ht 67.5 in | Wt 192.0 lb

## 2017-05-14 DIAGNOSIS — R7989 Other specified abnormal findings of blood chemistry: Secondary | ICD-10-CM | POA: Diagnosis not present

## 2017-05-14 DIAGNOSIS — G478 Other sleep disorders: Secondary | ICD-10-CM | POA: Diagnosis not present

## 2017-05-14 DIAGNOSIS — Z9989 Dependence on other enabling machines and devices: Secondary | ICD-10-CM

## 2017-05-14 DIAGNOSIS — E663 Overweight: Secondary | ICD-10-CM | POA: Diagnosis not present

## 2017-05-14 DIAGNOSIS — G4733 Obstructive sleep apnea (adult) (pediatric): Secondary | ICD-10-CM | POA: Diagnosis not present

## 2017-05-14 DIAGNOSIS — H5213 Myopia, bilateral: Secondary | ICD-10-CM | POA: Diagnosis not present

## 2017-05-14 DIAGNOSIS — R351 Nocturia: Secondary | ICD-10-CM | POA: Diagnosis not present

## 2017-05-14 NOTE — Progress Notes (Signed)
Subjective:    Patient ID: David [name] is a 67 y.o. male.  HPI     David Age, MD, PhD [facility] [address], [address] [place], [place] [address]  Dear Dr. Harrington Challenger,  I saw your patient, David [name], upon your kind request in my neurologic clinic today for initial consultation of his sleep disorder, in particular, concern for underlying obstructive sleep apnea. The patient is unaccompanied today. As you know, David [name] is a 67 year old right-handed gentleman with an underlying medical history of hypothyroidism, depression, ulcerative colitis, arthritis with status post right total knee replacement, history of renal cancer, hyperlipidemia, depression, and overweight state, who was previously diagnosed with obstructive sleep apnea and placed on CPAP therapy. He indicates compliance with CPAP but he has not been reevaluated in over 10 years. His sleep study was approximately 13 years ago. His CPAP machine is older and unfortunately a download is not possible from this machine. Prior sleep study report was reviewed from 06/24/2004. He had a split-night sleep study. His AHI at baseline was 25.8 per hour, O2 nadir of 80%. He was titrated to 10 cm of water pressure. He reports that he continues to be on 10 cm of pressure. He uses nasal pillows. His DME company has been [facility]. He indicates an Epworth sleepiness score of 1 out of 24 today, fatigue score is 44 out of 63. I reviewed your office note from 02/06/2017, which you kindly included.  He is married and lives with his wife. They have 5 children. He reports being semiretired. He is a nonsmoker and does not utilize alcohol, does not drink caffeine on a day-to-day basis. Teaches online.  He is in bed by 10 PM, no TV in bed, reports he had TE as an adult, before his original sleep study. He had a cold a few weeks ago.  No issues with eyes, no Rx eye glasses. He is one of 9 kids, father and oldest  brother had/has OSA.  He denies telltale symptoms of restless leg syndrome. He denies morning headaches. His current CPAP machine is not working properly. He's had some issues with the temperature and humidifier settings. He has had issues with fatigue. He is in process of treating his low testosterone. He does not wake up rested typically.   His Past Medical History Is Significant For: Past Medical History:  Diagnosis Date  . Anxiety   . GERD (gastroesophageal reflux disease)   . History of kidney stones   . History of renal cell carcinoma    s/p  left partial nephrectomy  . History of small bowel obstruction    recurrent  . History of ulcerative colitis    s/p total colectomy 1981  . Hypothyroidism   . 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 OA (osteoarthritis) of knee    both  . OSA on CPAP   . Personal history of asthma    childhood  . Right nephrolithiasis    non-obstuctive per ct 03-14-2015  . Right ureteral stone   . S/P ileostomy ([place])     His Past Surgical History Is Significant For: Past Surgical History:  Procedure Laterality Date  . APPENDECTOMY  1981  . CYSTO/  RIGHT RETROGRADE PYELOGRAM/  RIGHT STENT PLACEMENT  09-23-2006  . KNEE ARTHROSCOPY Left 01/01/2015   Procedure: ARTHROSCOPY KNEE;  Surgeon: Elsie Saas, MD;  Location: [place];  Service: Orthopedics;  Laterality: Left;  . PARTIAL NEPHRECTOMY Left 2003  .  TONSILLECTOMY  1990's  . TOTAL KNEE ARTHROPLASTY Left 01/01/2015   Procedure: TOTAL KNEE ARTHROPLASTY;  Surgeon: Elsie Saas, MD;  Location: [place];  Service: Orthopedics;  Laterality: Left;  . TOTAL KNEE ARTHROPLASTY Right 02/12/2015   Procedure: TOTAL RIGHT KNEE ARTHROPLASTY;  Surgeon: Elsie Saas, MD;  Location: [place];  Service: Orthopedics;  Laterality: Right;  . TOTAL PROCTOCOLECTOMY W/ END ILEOSTOMY  1981    His Family History Is Significant For: Family History  Problem Relation [name] of Onset  . Cancer Father        thyroid     His Social History Is Significant For: Social History   Socioeconomic History  . Marital status: Married    Spouse name: None  . Number of children: None  . Years of education: None  . Highest education level: None  Social Needs  . Financial resource strain: None  . Food insecurity - worry: None  . Food insecurity - inability: None  . Transportation needs - medical: None  . Transportation needs - non-medical: None  Occupational History  . None  Tobacco Use  . Smoking status: Never Smoker  . Smokeless tobacco: Never Used  Substance and Sexual Activity  . Alcohol use: No  . Drug use: No  . Sexual activity: None  Other Topics Concern  . None  Social History Narrative  . None    His Allergies Are:  No Known Allergies:   His Current Medications Are:  Outpatient Encounter Medications as of 05/14/2017  Medication Sig  . aspirin EC 81 MG tablet Take 81 mg by mouth daily.  . clomiPHENE (CLOMID) 50 MG tablet Take 25 mg by mouth daily.  . DULoxetine (CYMBALTA) 30 MG capsule Take 30 mg by mouth at bedtime.  . DULoxetine HCl (CYMBALTA PO) Take 15 mg by mouth daily.  . Fluticasone-Salmeterol (ADVAIR) 250-50 MCG/DOSE AEPB Inhale 1 puff into the lungs every other day.   . levothyroxine (SYNTHROID, LEVOTHROID) 175 MCG tablet Take 175 mcg by mouth daily before breakfast.  . mometasone (ELOCON) 0.1 % cream Apply 1 application topically daily.  . Multiple Vitamin (MULTIVITAMIN WITH MINERALS) TABS Take 1 tablet by mouth daily.  . sildenafil (REVATIO) 20 MG tablet Take 20 mg by mouth 3 (three) times daily.  . tamsulosin (FLOMAX) 0.4 MG CAPS capsule Take 0.4 mg by mouth as needed.   . temazepam (RESTORIL) 7.5 MG capsule Take 7.5 mg by mouth at bedtime as needed for sleep.  . [DISCONTINUED] ALPRAZolam (XANAX) 0.25 MG tablet Take 0.25 mg by mouth 3 (three) times daily as needed for anxiety.  . [DISCONTINUED] celecoxib (CELEBREX) 200 MG capsule 1 tab po q day with food for pain and   swelling (Patient taking differently: Take 200 mg by mouth every evening. )  . [DISCONTINUED] cephALEXin (KEFLEX) 500 MG capsule Take 1 capsule (500 mg total) by mouth 2 (two) times daily.  . [DISCONTINUED] docusate sodium (COLACE) 100 MG capsule 1 tab 2 times a day while on narcotics.  STOOL SOFTENER (Patient taking differently: Take 100 mg by mouth 2 (two) times daily as needed. 1 tab 2 times a day while on narcotics.  STOOL SOFTENER)  . [DISCONTINUED] omeprazole (PRILOSEC OTC) 20 MG tablet Take 20 mg by mouth every morning.   . [DISCONTINUED] oxyCODONE (OXY IR/ROXICODONE) 5 MG immediate release tablet 1-2 tablets every 4-6 hrs as needed for pain  . [DISCONTINUED] polyethylene glycol (MIRALAX / GLYCOLAX) packet 17grams in 16 oz of water twice a day until bowel movement.  LAXITIVE.  Restart if two days since last bowel movement (Patient taking differently: as needed. 17grams in 16 oz of water twice a day until bowel movement.  LAXITIVE.  Restart if two days since last bowel movement)   No facility-administered encounter medications on file as of 05/14/2017.   :  Review of Systems:  Out of a complete 14 point review of systems, all are reviewed and negative with the exception of these symptoms as listed below: Review of Systems  Neurological:       Pt presents today to discuss his sleep. Pt had a sleep study about 13 years ago and has been on cpap ever since. Pt is unsure of how old his current cpap is. Pt's current cpap is too old to be downloaded by our office. Pt's DME is [employer].  Epworth Sleepiness Scale 0= would never doze 1= slight chance of dozing 2= moderate chance of dozing 3= high chance of dozing  Sitting and reading: 0 Watching TV: 0 Sitting inactive in a public place (ex. Theater or meeting): 0 As a passenger in a car for an hour without a break: 0 Lying down to rest in the afternoon: 1 Sitting and talking to someone: 0 Sitting quietly after lunch (no alcohol): 0 In a car,  while stopped in traffic: 0 Total: 1     Objective:  Neurological Exam  Physical Exam Physical Examination:   Vitals:   05/14/17 1340  BP: (!) 154/105  Pulse: 66    General Examination: The patient is a very pleasant 67 y.o. male in no acute distress. He appears well-developed and well-nourished and well groomed.   HEENT: Normocephalic, atraumatic, pupils are equal, round and reactive to light and accommodation. Funduscopic exam is normal with sharp disc margins noted. Possibly beginning cataracts bilaterally. Of note, he had an eye exam today and pupils are still a little dilated and sluggish to react. Extraocular tracking is good without limitation to gaze excursion or nystagmus noted. Normal smooth pursuit is noted. Hearing is grossly intact. Face is symmetric with normal facial animation and normal facial sensation. Speech is clear with no dysarthria noted. There is no hypophonia. There is no lip, neck/head, jaw or voice tremor. Neck is supple with full range of passive and active motion. There are no carotid bruits on auscultation. Oropharynx exam reveals: mild mouth dryness, adequate dental hygiene and marked airway crowding, due to smaller airway entry, larger uvula, what appears to be either residual or regrown tonsils of 2+ bilaterally. Mallampati is class II. Neck circumference is 16-3/8 inches.  Chest: Clear to auscultation without wheezing, rhonchi or crackles noted.  Heart: S1+S2+0, regular and normal without murmurs, rubs or gallops noted.   Abdomen: Soft, non-tender and non-distended with normal bowel sounds appreciated on auscultation.  Extremities: There is no pitting edema in the distal lower extremities bilaterally. Pedal pulses are intact.  Skin: Warm and dry without trophic changes noted.  Musculoskeletal: exam reveals no obvious joint deformities, tenderness or joint swelling or erythema.   Neurologically:  Mental status: The patient is awake, alert and  oriented in all 4 spheres. His immediate and remote memory, attention, language skills and fund of knowledge are appropriate. There is no evidence of aphasia, agnosia, apraxia or anomia. Speech is clear with normal prosody and enunciation. Thought process is linear. Mood is normal and affect is normal.  Cranial nerves II - XII are as described above under HEENT exam. In addition: shoulder shrug is normal with equal shoulder height noted.  Motor exam: Normal bulk, strength and tone is noted. There is no drift, tremor or rebound. Romberg is negative. Reflexes are 2+ throughout. Fine motor skills and coordination: intact with normal finger taps, normal hand movements, normal rapid alternating patting, normal foot taps and normal foot agility.  Cerebellar testing: No dysmetria or intention tremor on finger to nose testing. Heel to shin is unremarkable bilaterally. There is no truncal or gait ataxia.  Sensory exam: intact to light touch in the upper and lower extremities.  Gait, station and balance: He stands easily. No veering to one side is noted. No leaning to one side is noted. Posture is [name]-appropriate and stance is narrow based. Gait shows normal stride length and normal pace. No problems turning are noted. Tandem walk is unremarkable.         Assessment and Plan:  In summary, SIONE BAUMGARTEN is a very pleasant 67 y.o.-year old male with an underlying medical history of hypothyroidism, depression, ulcerative colitis, arthritis with status post right total knee replacement, history of renal cancer, hyperlipidemia, depression, and overweight state, who presents for a sleep consultation. He carries a prior diagnosis of moderate obstructive sleep apnea and has been on CPAP therapy. Compliance data is not available for my review today. He would benefit from reevaluation and updating his treatment settings and upgrading to a new machine which is rather old at this point and does not always work properly. He does  not wake up fully rested and has struggled with low testosterone and resulting issues with fatigue probably.  I had a long chat with the patient and d about my findings and the diagnosis of OSA, its prognosis and treatment options. We talked about medical treatments, surgical interventions and non-pharmacological approaches. I explained in particular the risks and ramifications of untreated moderate to severe OSA, especially with respect to developing cardiovascular disease down the Road, including congestive heart failure, difficult to treat hypertension, cardiac arrhythmias, or stroke. Even type 2 diabetes has, in part, been linked to untreated OSA. Symptoms of untreated OSA include daytime sleepiness, memory problems, mood irritability and mood disorder such as depression and anxiety, lack of energy, as well as recurrent headaches, especially morning headaches. We talked about maintain a healthy lifestyle in general, as well as the importance of weight control. I encouraged the patient to eat healthy, exercise daily and keep well hydrated, to keep a scheduled bedtime and wake time routine, to not skip any meals and eat healthy snacks in between meals. I advised the patient not to drive when feeling sleepy. I recommended the following at this time: sleep study with potential positive airway pressure titration. (We will score hypopneas at 4%). He is somewhat concerned as he has difficulty sleeping without his CPAP. He is not sure if he can sleep well enough for the diagnostic portion of the sleep study.  I explained the sleep test procedure to the patient and also outlined possible surgical and non-surgical treatment options of OSA, including the use of a custom-made dental device (which would require a referral to a specialist dentist or oral surgeon), upper airway surgical options, such as pillar implants, radiofrequency surgery, tongue base surgery, and UPPP (which would involve a referral to an ENT  surgeon). Rarely, jaw surgery such as mandibular advancement may be considered.  I also explained the CPAP treatment option to the patient, who indicated that he would be willing to continue with CPAP if the need arises. I explained the importance of being compliant with PAP  treatment, not only for insurance purposes but primarily to improve His symptoms, and for the patient's long term health benefit, including to reduce His cardiovascular risks. I answered all his questions today and the patient was in agreement. I would like to see him back after the sleep study is completed and encouraged him to call with any interim questions, concerns, problems or updates.   Thank you very much for allowing me to participate in the care of this nice patient. If I can be of any further assistance to you please do not hesitate to call me at [phone].  Sincerely,   David Age, MD, PhD

## 2017-05-14 NOTE — Patient Instructions (Signed)

## 2017-05-21 ENCOUNTER — Ambulatory Visit (INDEPENDENT_AMBULATORY_CARE_PROVIDER_SITE_OTHER): Payer: Medicare HMO | Admitting: Neurology

## 2017-05-21 DIAGNOSIS — G4720 Circadian rhythm sleep disorder, unspecified type: Secondary | ICD-10-CM

## 2017-05-21 DIAGNOSIS — G478 Other sleep disorders: Secondary | ICD-10-CM

## 2017-05-21 DIAGNOSIS — Z9989 Dependence on other enabling machines and devices: Secondary | ICD-10-CM

## 2017-05-21 DIAGNOSIS — G4733 Obstructive sleep apnea (adult) (pediatric): Secondary | ICD-10-CM

## 2017-05-21 DIAGNOSIS — E663 Overweight: Secondary | ICD-10-CM

## 2017-05-21 DIAGNOSIS — R351 Nocturia: Secondary | ICD-10-CM

## 2017-05-21 DIAGNOSIS — R7989 Other specified abnormal findings of blood chemistry: Secondary | ICD-10-CM

## 2017-05-21 DIAGNOSIS — G4761 Periodic limb movement disorder: Secondary | ICD-10-CM

## 2017-05-22 ENCOUNTER — Other Ambulatory Visit: Payer: Self-pay | Admitting: Neurology

## 2017-05-22 DIAGNOSIS — G4733 Obstructive sleep apnea (adult) (pediatric): Secondary | ICD-10-CM

## 2017-05-22 DIAGNOSIS — R7989 Other specified abnormal findings of blood chemistry: Secondary | ICD-10-CM

## 2017-05-22 DIAGNOSIS — G4720 Circadian rhythm sleep disorder, unspecified type: Secondary | ICD-10-CM

## 2017-05-22 DIAGNOSIS — G4761 Periodic limb movement disorder: Secondary | ICD-10-CM

## 2017-05-22 NOTE — Procedures (Signed)
PATIENT'S NAME:  David [name], David [name] DOB:      30-Jul-1950      MR#:    [id number]     DATE OF RECORDING: 05/21/2017 REFERRING M.D.:  [name], MD Study Performed:   Baseline Polysomnogram HISTORY: 67 year old man with a history of hypothyroidism, depression, ulcerative colitis, arthritis with status post right total knee replacement, history of renal cancer, hyperlipidemia, depression, and overweight state, who was previously diagnosed with obstructive sleep apnea and placed on CPAP therapy. He presents for re-evaluation, in order to update supplies and equipment, optimize settings. The patient's weight 192 pounds with a height of 67 (inches), resulting in a BMI of 30.1 kg/m2. The patient's neck circumference measured 16.5 inches.  CURRENT MEDICATIONS: Aspirin, Clomid, Cymbalta, Advair, Synthroid, Revatio, Restoril, Flomax   PROCEDURE:  This is a multichannel digital polysomnogram utilizing the Somnostar 11.2 system.  Electrodes and sensors were applied and monitored per AASM Specifications.   EEG, EOG, Chin and Limb EMG, were sampled at 200 Hz.  ECG, Snore and Nasal Pressure, Thermal Airflow, Respiratory Effort, CPAP Flow and Pressure, Oximetry was sampled at 50 Hz. Digital video and audio were recorded.      BASELINE STUDY  Lights Out was at 21:13 and Lights On at 04:35.  Total recording time (TRT) was 442 minutes, with a total sleep time (TST) of  115.5 minutes, which is very low.   The patient's sleep latency was 115 minutes, which is markedly delayed.  REM sleep was absent. The sleep efficiency was poor at 26.1 %.     SLEEP ARCHITECTURE: WASO (Wake after sleep onset) was 212 minutes, which is very high with severe sleep fragmentation noted. There were 67.5 minutes in Stage N1, 35.5 minutes Stage N2, 12.5 minutes Stage N3 and 0 minutes in Stage REM.  The percentage of Stage N1 was 58.4%, which is highly increased, Stage N2 was 30.7%, Stage N3 was 10.8% and Stage R (REM sleep) was absent. The  arousals were noted as: 28 were spontaneous, 52 were associated with PLMs, 104 were associated with respiratory events.  Audio and video analysis did not show any abnormal or unusual movements, behaviors, phonations or vocalizations. The patient took 3 bathroom breaks. Mild to moderate snoring was noted. The EKG was in keeping with normal sinus rhythm (NSR).  RESPIRATORY ANALYSIS:  There were a total of 68 respiratory events:  47 obstructive apneas, 17 central apneas and 1 mixed apneas with a total of 65 apneas and an apnea index (AI) of 33.8 /hour. There were 3 hypopneas with a hypopnea index of 1.6 /hour. The patient also had 36 respiratory event related arousals (RERAs).      The total APNEA/HYPOPNEA INDEX (AHI) was 35.3/hour and the total RESPIRATORY DISTURBANCE INDEX was 54. /hour.  0 events occurred in REM sleep and 24 events in NREM. The REM AHI was 0 /hour, versus a non-REM AHI of 35.3. The patient spent 2 minutes of total sleep time in the supine position and 114 minutes in non-supine.. The supine AHI was 60.0 versus a non-supine AHI of 34.9.  OXYGEN SATURATION & C02:  The Wake baseline 02 saturation was 96%, with the lowest being 86%. Time spent below 89% saturation equaled 2 minutes.  PERIODIC LIMB MOVEMENTS: The patient had a total of 68 Periodic Limb Movements.  The Periodic Limb Movement (PLM) index was 35.3 and the PLM Arousal index was 27./hour. Post-study, the patient indicated that sleep was than usual.   IMPRESSION: 1. Obstructive Sleep Apnea (OSA) 2.  Periodic Limb Movement Disorder (PLMD) 3. Dysfunctions associated with sleep stages or arousal from sleep  RECOMMENDATIONS: 1. Please note, that this study was rather limited due to poor sleep efficiency with very little total sleep time, severe sleep fragmentation and absence of REM sleep. Nevertheless, this study demonstrates severe obstructive sleep apnea, with a total AHI of 35.3/hour, and O2 nadir of 86%. Again, the absence of  REM sleep and minimal amount of supine sleep likely underestimate the AHI and O2 nadir. Of note, patient did report, not being able to sleep without his CPAP. Treatment with positive airway pressure in the form of CPAP is recommended. This will require a full night titration study to optimize therapy. Other treatment options may include avoidance of supine sleep position along with weight loss, upper airway or jaw surgery in selected patients or the use of an oral appliance in certain patients. ENT evaluation and/or consultation with a maxillofacial surgeon or dentist may be feasible in some instances.    2. Please note that untreated obstructive sleep apnea carries additional perioperative morbidity. Patients with significant obstructive sleep apnea should receive perioperative PAP therapy and the surgeons and particularly the anesthesiologist should be informed of the diagnosis and the severity of the sleep disordered breathing. 3. Moderate PLMs (periodic limb movements of sleep) were noted during this study with moderate arousals; clinical correlation is recommended. Medication effect from the antidepressant medication should be considered.  4. This study shows sleep fragmentation and abnormal sleep stage percentages; these are nonspecific findings and per se do not signify an intrinsic sleep disorder or a cause for the patient's sleep-related symptoms. Causes include (but are not limited to) the first night effect of the sleep study, circadian rhythm disturbances, medication effect or an underlying mood disorder or medical problem.  5. The patient should be cautioned not to drive, work at heights, or operate dangerous or heavy equipment when tired or sleepy. Review and reiteration of good sleep hygiene measures should be pursued with any patient. 6. The patient will be seen in follow-up by Dr. Rexene Alberts at [facility] for discussion of the test results and further management strategies. The referring provider will be  notified of the test results.  I certify that I have reviewed the entire raw data recording prior to the issuance of this report in accordance with the Standards of Accreditation of the American Academy of Sleep Medicine (AASM)  Star Age, MD, PhD Diplomat, American Board of Psychiatry and Neurology (Neurology and Sleep Medicine)

## 2017-05-22 NOTE — Progress Notes (Signed)
Patient referred by Dr. Harrington Challenger, seen by me on 05/14/17, diagnostic PSG on 05/21/17.   Please call and notify the patient that the recent sleep study confirmed his prior Dx of OSA and showed severe obstructive sleep apnea. I recommend treatment for this in the form of CPAP. This will require a repeat sleep study for proper titration and mask fitting and correct monitoring of the oxygen saturations. Unfortunately, he did not sleep well enough or long enough to do a titration in the same night (he did say, he cannot sleep without his CPAP!). Please explain to patient the need for the second study. I have placed an order in the chart. Thanks.  Star Age, MD, PhD [facility])

## 2017-05-25 ENCOUNTER — Telehealth: Payer: Self-pay

## 2017-05-25 NOTE — Telephone Encounter (Signed)
-----   Message from Star Age, MD sent at 05/22/2017 11:42 AM EST ----- Patient referred by Dr. Harrington Challenger, seen by me on 05/14/17, diagnostic PSG on 05/21/17.   Please call and notify the patient that the recent sleep study confirmed his prior Dx of OSA and showed severe obstructive sleep apnea. I recommend treatment for this in the form of CPAP. This will require a repeat sleep study for proper titration and mask fitting and correct monitoring of the oxygen saturations. Unfortunately, he did not sleep well enough or long enough to do a titration in the same night (he did say, he cannot sleep without his CPAP!). Please explain to patient the need for the second study. I have placed an order in the chart. Thanks.  Star Age, MD, PhD [facility])

## 2017-05-25 NOTE — Telephone Encounter (Signed)

## 2017-06-03 ENCOUNTER — Ambulatory Visit (INDEPENDENT_AMBULATORY_CARE_PROVIDER_SITE_OTHER): Payer: Medicare HMO | Admitting: Neurology

## 2017-06-03 DIAGNOSIS — Z111 Encounter for screening for respiratory tuberculosis: Secondary | ICD-10-CM | POA: Diagnosis not present

## 2017-06-03 DIAGNOSIS — R972 Elevated prostate specific antigen [PSA]: Secondary | ICD-10-CM | POA: Diagnosis not present

## 2017-06-03 DIAGNOSIS — R7989 Other specified abnormal findings of blood chemistry: Secondary | ICD-10-CM

## 2017-06-03 DIAGNOSIS — E782 Mixed hyperlipidemia: Secondary | ICD-10-CM | POA: Diagnosis not present

## 2017-06-03 DIAGNOSIS — Z0189 Encounter for other specified special examinations: Secondary | ICD-10-CM | POA: Diagnosis not present

## 2017-06-03 DIAGNOSIS — Z932 Ileostomy status: Secondary | ICD-10-CM | POA: Diagnosis not present

## 2017-06-03 DIAGNOSIS — E039 Hypothyroidism, unspecified: Secondary | ICD-10-CM | POA: Diagnosis not present

## 2017-06-03 DIAGNOSIS — G4720 Circadian rhythm sleep disorder, unspecified type: Secondary | ICD-10-CM

## 2017-06-03 DIAGNOSIS — G4733 Obstructive sleep apnea (adult) (pediatric): Secondary | ICD-10-CM

## 2017-06-03 DIAGNOSIS — R69 Illness, unspecified: Secondary | ICD-10-CM | POA: Diagnosis not present

## 2017-06-03 DIAGNOSIS — G4761 Periodic limb movement disorder: Secondary | ICD-10-CM

## 2017-06-03 DIAGNOSIS — E291 Testicular hypofunction: Secondary | ICD-10-CM | POA: Diagnosis not present

## 2017-06-03 DIAGNOSIS — Z0001 Encounter for general adult medical examination with abnormal findings: Secondary | ICD-10-CM | POA: Diagnosis not present

## 2017-06-03 DIAGNOSIS — K219 Gastro-esophageal reflux disease without esophagitis: Secondary | ICD-10-CM | POA: Diagnosis not present

## 2017-06-08 DIAGNOSIS — E039 Hypothyroidism, unspecified: Secondary | ICD-10-CM | POA: Diagnosis not present

## 2017-06-09 ENCOUNTER — Telehealth: Payer: Self-pay

## 2017-06-09 ENCOUNTER — Other Ambulatory Visit: Payer: Self-pay | Admitting: Neurology

## 2017-06-09 DIAGNOSIS — G4733 Obstructive sleep apnea (adult) (pediatric): Secondary | ICD-10-CM

## 2017-06-09 NOTE — Progress Notes (Signed)
Patient referred by Dr. Harrington Challenger, seen by me on 05/14/17, diagnostic PSG on 05/21/17. Patient had a CPAP titration study on 06/03/17. May have prior DME.   Please call and inform patient that I have entered an order for treatment with positive airway pressure (PAP) treatment for obstructive sleep apnea (OSA). He did well during the latest sleep study with CPAP. We will, therefore, arrange for a machine for home use through a DME (durable medical equipment) company of His choice; and I will see the patient back in follow-up in about 10 weeks. Please also explain to the patient that I will be looking out for compliance data, which can be downloaded from the machine (stored on an SD card, that is inserted in the machine) or via remote access through a modem, that is built into the machine. At the time of the followup appointment we will discuss sleep study results and how it is going with PAP treatment at home. Please advise patient to bring His machine at the time of the first FU visit, even though this is cumbersome. Bringing the machine for every visit after that will likely not be needed, but often helps for the first visit to troubleshoot if needed. Please re-enforce the importance of compliance with treatment and the need for [place] to monitor compliance data - often an insurance requirement and actually good feedback for the patient as far as how they are doing.  Also remind patient, that any interim PAP machine or mask issues should be first addressed with the DME company, as they can often help better with technical and mask fit issues. Please ask if patient has a preference regarding DME company.  Please also make sure, the patient has a follow-up appointment with me in about 10 weeks from the setup date, thanks. May see one of our nurse practitioners if needed for proper timing of the FU appointment.  Please fax or rout report to the referring provider. Thanks,   Star Age, MD, PhD [facility])

## 2017-06-09 NOTE — Procedures (Signed)
PATIENT'S NAME:  David [name], David [name] DOB:      [birth year]      MR#:    [id number]     DATE OF RECORDING: 06/03/2017 REFERRING M.D.:  [name], MD Study Performed:   CPAP  Titration HISTORY: 66 year old man with a history of hypothyroidism, depression, ulcerative colitis, arthritis with status post right total knee replacement, history of renal cancer, hyperlipidemia, depression, and overweight state, who returns for CPAP titration. Her PSG from 05/21/2017 showed an AHI of 35.3 and low spo2 of 86%. The patient endorsed the Epworth Sleepiness Scale at 1 points, BMI of 30.1 kg/m2. The patient's neck circumference measured 16 inches.  CURRENT MEDICATIONS: Aspirin, Clomid, Cymbalta, Advair, Synthroid, Revatio, Restoril, Flomax   PROCEDURE:  This is a multichannel digital polysomnogram utilizing the SomnoStar 11.2 system.  Electrodes and sensors were applied and monitored per AASM Specifications.   EEG, EOG, Chin and Limb EMG, were sampled at 200 Hz.  ECG, Snore and Nasal Pressure, Thermal Airflow, Respiratory Effort, CPAP Flow and Pressure, Oximetry was sampled at 50 Hz. Digital video and audio were recorded.      The patient was fitted with a medium Dreamwear FFM. CPAP was initiated at 5 cmH20 with heated humidity per AASM standards and pressure was advanced to 9 cmH20 because of hypopneas, apneas and desaturations. At a PAP pressure of 9 cmH20, there was a reduction of the AHI to 0 with minimal supine REM sleep achieved and O2 nadir of 93%.   Lights Out was at 22:48 and Lights On at 05:01. Total recording time (TRT) was 374 minutes, with a total sleep time (TST) of 226 minutes. The patient's sleep latency was 89 minutes, which is markedly delayed. REM latency was 83 minutes. The sleep efficiency was 60.4%, which is reduced.    SLEEP ARCHITECTURE: WASO (Wake after sleep onset)  was 40 minutes with a few longer periods of wakefulness. There were 21 minutes in Stage N1, 61.5 minutes Stage N2, 105  minutes Stage N3 and 38.5 minutes in Stage REM.  The percentage of Stage N1 was 9.3%, Stage N2 was 27.2%, Stage N3 was 46.5%, which is increased, and Stage R (REM sleep) was 17.%. The arousals were noted as: 46 were spontaneous, 24 were associated with PLMs, 0 were associated with respiratory events.  Audio and video analysis did not show any abnormal or unusual movements, behaviors, phonations or vocalizations. The patient took 1 bathroom break. EKG was in keeping with normal sinus rhythm (NSR).  RESPIRATORY ANALYSIS:  There was a total of 0 respiratory events: 0 obstructive apneas, 0 central apneas and 0 mixed apneas with a total of 0 apneas and an apnea index (AI) of 0 /hour. There were 0 hypopneas with a hypopnea index of 0/hour. The patient also had 0 respiratory event related arousals (RERAs).      The total APNEA/HYPOPNEA INDEX  (AHI) was 0 /hour and the total RESPIRATORY DISTURBANCE INDEX was 0 .hour  0 events occurred in REM sleep and 0 events in NREM. The REM AHI was 0 /hour versus a non-REM AHI of 0 /hour.  The patient spent 69 minutes of total sleep time in the supine position and 157 minutes in non-supine. The supine AHI was 0.0, versus a non-supine AHI of 0.0.  OXYGEN SATURATION & C02:  The baseline 02 saturation was 96%, with the lowest being 92%. Time spent below 89% saturation equaled 0 minutes.  PERIODIC LIMB MOVEMENTS: The patient had a total of 49 Periodic Limb Movements. The  Periodic Limb Movement (PLM) index was 13. and the PLM Arousal index was 6.4 /hour.  Post-study, the patient indicated that sleep was worse than usual.   IMPRESSION: 1. Obstructive Sleep Apnea (OSA) 2. Dysfunctions associated with sleep stages or arousal from sleep   RECOMMENDATIONS: 1. This study demonstrates resolution of the patient's obstructive sleep apnea with CPAP therapy. I will, therefore, start the patient on home CPAP treatment at a pressure of 9 cm via medium full face mask with heated  humidity. The patient should be reminded to be fully compliant with PAP therapy to improve sleep related symptoms and decrease long term cardiovascular risks. The patient should be reminded, that it may take up to 3 months to get fully used to using PAP with all planned sleep. The earlier full compliance is achieved, the better long term compliance tends to be. Please note that untreated obstructive sleep apnea carries additional perioperative morbidity. Patients with significant obstructive sleep apnea should receive perioperative PAP therapy and the surgeons and particularly the anesthesiologist should be informed of the diagnosis and the severity of the sleep disordered breathing. 2. This study shows sleep fragmentation and abnormal sleep stage percentages; these are nonspecific findings and per se do not signify an intrinsic sleep disorder or a cause for the patient's sleep-related symptoms. Causes include (but are not limited to) the first night effect of the sleep study, circadian rhythm disturbances, medication effect or an underlying mood disorder or medical problem.  3. The patient should be cautioned not to drive, work at heights, or operate dangerous or heavy equipment when tired or sleepy. Review and reiteration of good sleep hygiene measures should be pursued with any patient. 4. The patient will be seen in follow-up by Dr. Rexene Alberts at [facility] for discussion of the test results and further management strategies. The referring provider will be notified of the test results.   I certify that I have reviewed the entire raw data recording prior to the issuance of this report in accordance with the Standards of Accreditation of the American Academy of Sleep Medicine (AASM)   Star Age, MD, PhD Diplomat, American Board of Psychiatry and Neurology (Neurology and Sleep Medicine)

## 2017-06-09 NOTE — Telephone Encounter (Signed)
I called pt. I advised pt that Dr. Rexene Alberts reviewed their sleep study results and found that pt did well during the latest sleep study. Dr. Rexene Alberts recommends that pt start a cpap at home. I reviewed PAP compliance expectations with the pt. Pt is agreeable to starting a CPAP. I advised pt that an order will be sent to a DME, [facility], and [employer] will call the pt within about one week after they file with the pt's insurance. [facility] will show the pt how to use the machine, fit for masks, and troubleshoot the CPAP if needed. A follow up appt was made for insurance purposes with Dr. Rexene Alberts on June 4th, 2019 at 1:00pm. Pt verbalized understanding to arrive 15 minutes early and bring their CPAP. A letter with all of this information in it will be mailed to the pt as a reminder. I verified with the pt that the address we have on file is correct. Pt verbalized understanding of results. Pt had no questions at this time but was encouraged to call back if questions arise.

## 2017-06-09 NOTE — Telephone Encounter (Signed)
-----   Message from Star Age, MD sent at 06/09/2017  8:36 AM EST ----- Patient referred by Dr. Harrington Challenger, seen by me on 05/14/17, diagnostic PSG on 05/21/17. Patient had a CPAP titration study on 06/03/17. May have prior DME.   Please call and inform patient that I have entered an order for treatment with positive airway pressure (PAP) treatment for obstructive sleep apnea (OSA). He did well during the latest sleep study with CPAP. We will, therefore, arrange for a machine for home use through a DME (durable medical equipment) company of His choice; and I will see the patient back in follow-up in about 10 weeks. Please also explain to the patient that I will be looking out for compliance data, which can be downloaded from the machine (stored on an SD card, that is inserted in the machine) or via remote access through a modem, that is built into the machine. At the time of the followup appointment we will discuss sleep study results and how it is going with PAP treatment at home. Please advise patient to bring His machine at the time of the first FU visit, even though this is cumbersome. Bringing the machine for every visit after that will likely not be needed, but often helps for the first visit to troubleshoot if needed. Please re-enforce the importance of compliance with treatment and the need for [place] to monitor compliance data - often an insurance requirement and actually good feedback for the patient as far as how they are doing.  Also remind patient, that any interim PAP machine or mask issues should be first addressed with the DME company, as they can often help better with technical and mask fit issues. Please ask if patient has a preference regarding DME company.  Please also make sure, the patient has a follow-up appointment with me in about 10 weeks from the setup date, thanks. May see one of our nurse practitioners if needed for proper timing of the FU appointment.  Please fax or rout report to the  referring provider. Thanks,   Star Age, MD, PhD [facility])

## 2017-06-17 DIAGNOSIS — G4733 Obstructive sleep apnea (adult) (pediatric): Secondary | ICD-10-CM | POA: Diagnosis not present

## 2017-06-18 DIAGNOSIS — E291 Testicular hypofunction: Secondary | ICD-10-CM | POA: Diagnosis not present

## 2017-06-18 DIAGNOSIS — E039 Hypothyroidism, unspecified: Secondary | ICD-10-CM | POA: Diagnosis not present

## 2017-06-23 DIAGNOSIS — Z13820 Encounter for screening for osteoporosis: Secondary | ICD-10-CM | POA: Diagnosis not present

## 2017-07-17 DIAGNOSIS — E039 Hypothyroidism, unspecified: Secondary | ICD-10-CM | POA: Diagnosis not present

## 2017-07-17 DIAGNOSIS — E291 Testicular hypofunction: Secondary | ICD-10-CM | POA: Diagnosis not present

## 2017-07-18 DIAGNOSIS — G4733 Obstructive sleep apnea (adult) (pediatric): Secondary | ICD-10-CM | POA: Diagnosis not present

## 2017-07-31 DIAGNOSIS — E890 Postprocedural hypothyroidism: Secondary | ICD-10-CM | POA: Diagnosis not present

## 2017-07-31 DIAGNOSIS — E291 Testicular hypofunction: Secondary | ICD-10-CM | POA: Diagnosis not present

## 2017-07-31 DIAGNOSIS — R972 Elevated prostate specific antigen [PSA]: Secondary | ICD-10-CM | POA: Diagnosis not present

## 2017-08-17 DIAGNOSIS — G4733 Obstructive sleep apnea (adult) (pediatric): Secondary | ICD-10-CM | POA: Diagnosis not present

## 2017-09-03 ENCOUNTER — Ambulatory Visit: Payer: Medicare HMO | Admitting: Neurology

## 2017-09-03 ENCOUNTER — Encounter: Payer: Self-pay | Admitting: Neurology

## 2017-09-03 VITALS — BP 160/99 | HR 79 | Ht 68.0 in | Wt 191.0 lb

## 2017-09-03 DIAGNOSIS — Z9989 Dependence on other enabling machines and devices: Secondary | ICD-10-CM | POA: Diagnosis not present

## 2017-09-03 DIAGNOSIS — G4733 Obstructive sleep apnea (adult) (pediatric): Secondary | ICD-10-CM

## 2017-09-03 NOTE — Progress Notes (Signed)
Subjective:    Patient ID: David [name] is a 67 y.o. male.  HPI    Interim history:   David [name] is a 67 year old right-handed gentleman with an underlying medical history of hypothyroidism, depression, ulcerative colitis, arthritis with status post right total knee replacement, history of renal cancer, hyperlipidemia, depression, and overweight state, who presents for follow-up consultation of his obstructive sleep apnea after her recent sleep study testing and starting CPAP therapy with new equipment. The patient is unaccompanied today. I first met him on 05/14/2017 at the request of his primary care physician, at which time patient reported a prior diagnosis of sleep apnea but had not had any reevaluation in over 10 years. He had an older CPAP machine. He was advised to proceed with sleep study testing to reevaluate and qualify for new equipment. He had a baseline sleep study, followed by a CPAP titration study. I went over his test results with him in detail today. His baseline sleep study from 05/21/2017 showed poor sleep efficiency at 26.1% only, he had a long sleep latency, REM sleep was absent, he had significant wake after sleep onset, 3 bathroom breaks. Mild to moderate snoring was noted, total AHI was 35.3 per hour. Average oxygen saturation was 96%, nadir during non-REM sleep was 86%. He had moderate PLMS with significant arousals. He was advised to return for a CPAP titration study. He had this on 06/03/2017. Sleep efficiency was 60.4%, sleep latency 89 minutes, REM latency 83 minutes. He used a medium fullface mask and CPAP was titrated from 5 cm to 9 cm. On the final pressure his AHI was 0 per hour with some supine REM sleep achieved and O2 nadir of 93%. He had no significant PLMS during the CPAP titration study. Based on his test results I prescribed new CPAP machine at a pressure of 9 cm via full facemask.  Today, 09/03/2017: I reviewed his CPAP compliance data from 08/02/2017  through 08/31/2013 which is a total of 30 days, during which time he was fully compliant with CPAP with an average usage of 9 hours. AHI at goal at 0.8 per hour, leak on the low side with the 95th percentile at 5.4 L/m on a pressure of 9 cm with EPR of 1. He reports doing well with the new CPAP, indicates feeling better with his current CPAP, pressure used to be 10 cm, he is very pleased with the nasal pillows and feels actually improved, he feels well rested. He is going on a long mission trip to [place] and won't be back until approximately October 2020.  The patient's allergies, current medications, family history, past medical history, past social history, past surgical history and problem list were reviewed and updated as appropriate.   Previously (copied from previous notes for reference):   05/14/2017: (He) was previously diagnosed with obstructive sleep apnea and placed on CPAP therapy. He indicates compliance with CPAP but he has not been reevaluated in over 10 years. His sleep study was approximately 13 years ago. His CPAP machine is older and unfortunately a download is not possible from this machine. Prior sleep study report was reviewed from 06/24/2004. He had a split-night sleep study. His AHI at baseline was 25.8 per hour, O2 nadir of 80%. He was titrated to 10 cm of water pressure. He reports that he continues to be on 10 cm of pressure. He uses nasal pillows. His DME company has been [facility]. He indicates an Epworth sleepiness score of 1 out of  24 today, fatigue score is 44 out of 63. I reviewed your office note from 02/06/2017, which you kindly included.  He is married and lives with his wife. They have 5 children. He reports being semiretired. He is a nonsmoker and does not utilize alcohol, does not drink caffeine on a day-to-day basis. Teaches online.  He is in bed by 10 PM, no TV in bed, reports he had TE as an adult, before his original sleep study. He had a cold a few weeks  ago.  No issues with eyes, no Rx eye glasses. He is one of 9 kids, father and oldest brother had/has OSA.  He denies telltale symptoms of restless leg syndrome. He denies morning headaches. His current CPAP machine is not working properly. He's had some issues with the temperature and humidifier settings. He has had issues with fatigue. He is in process of treating his low testosterone. He does not wake up rested typically.   His Past Medical History Is Significant For: Past Medical History:  Diagnosis Date  . Anxiety   . GERD (gastroesophageal reflux disease)   . History of kidney stones   . History of renal cell carcinoma    s/p  left partial nephrectomy  . History of small bowel obstruction    recurrent  . History of ulcerative colitis    s/p total colectomy 1981  . Hypothyroidism   . 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 OA (osteoarthritis) of knee    both  . OSA on CPAP   . Personal history of asthma    childhood  . Right nephrolithiasis    non-obstuctive per ct 03-14-2015  . Right ureteral stone   . S/P ileostomy ([place])     His Past Surgical History Is Significant For: Past Surgical History:  Procedure Laterality Date  . APPENDECTOMY  1981  . CYSTO/  RIGHT RETROGRADE PYELOGRAM/  RIGHT STENT PLACEMENT  09-23-2006  . KNEE ARTHROSCOPY Left 01/01/2015   Procedure: ARTHROSCOPY KNEE;  Surgeon: Elsie Saas, MD;  Location: [facility];  Service: Orthopedics;  Laterality: Left;  . PARTIAL NEPHRECTOMY Left 2003  . TONSILLECTOMY  1990's  . TOTAL KNEE ARTHROPLASTY Left 01/01/2015   Procedure: TOTAL KNEE ARTHROPLASTY;  Surgeon: Elsie Saas, MD;  Location: [facility];  Service: Orthopedics;  Laterality: Left;  . TOTAL KNEE ARTHROPLASTY Right 02/12/2015   Procedure: TOTAL RIGHT KNEE ARTHROPLASTY;  Surgeon: Elsie Saas, MD;  Location: [facility];  Service: Orthopedics;  Laterality: Right;  . TOTAL PROCTOCOLECTOMY W/ END ILEOSTOMY  1981    His Family History Is Significant  For: Family History  Problem Relation Age of Onset  . Cancer Father        thyroid    His Social History Is Significant For: Social History   Socioeconomic History  . Marital status: Married    Spouse name: Not on file  . Number of children: Not on file  . Years of education: Not on file  . Highest education level: Not on file  Occupational History  . Not on file  Social Needs  . Financial resource strain: Not on file  . Food insecurity:    Worry: Not on file    Inability: Not on file  . Transportation needs:    Medical: Not on file    Non-medical: Not on file  Tobacco Use  . Smoking status: Never Smoker  . Smokeless tobacco: Never Used  Substance and Sexual Activity  . Alcohol use: No  . Drug use:  No  . Sexual activity: Not on file  Lifestyle  . Physical activity:    Days per week: Not on file    Minutes per session: Not on file  . Stress: Not on file  Relationships  . Social connections:    Talks on phone: Not on file    Gets together: Not on file    Attends religious service: Not on file    Active member of club or organization: Not on file    Attends meetings of clubs or organizations: Not on file    Relationship status: Not on file  Other Topics Concern  . Not on file  Social History Narrative  . Not on file    His Allergies Are:  No Known Allergies:   His Current Medications Are:  Outpatient Encounter Medications as of 09/03/2017  Medication Sig  . aspirin EC 81 MG tablet Take 81 mg by mouth daily.  . DULoxetine (CYMBALTA) 30 MG capsule Take 30 mg by mouth at bedtime.  . DULoxetine HCl (CYMBALTA PO) Take 15 mg by mouth daily.  . Fluticasone-Salmeterol (ADVAIR) 250-50 MCG/DOSE AEPB Inhale 1 puff into the lungs every other day.   . levothyroxine (SYNTHROID, LEVOTHROID) 137 MCG tablet Take 137 mcg by mouth daily before breakfast.  . mometasone (ELOCON) 0.1 % cream Apply 1 application topically daily.  . Multiple Vitamin (MULTIVITAMIN WITH MINERALS)  TABS Take 1 tablet by mouth daily.  . tamsulosin (FLOMAX) 0.4 MG CAPS capsule Take 0.4 mg by mouth as needed.   . temazepam (RESTORIL) 7.5 MG capsule Take 7.5 mg by mouth at bedtime as needed for sleep.  . [DISCONTINUED] clomiPHENE (CLOMID) 50 MG tablet Take 25 mg by mouth daily.  . [DISCONTINUED] levothyroxine (SYNTHROID, LEVOTHROID) 175 MCG tablet Take 175 mcg by mouth daily before breakfast.  . [DISCONTINUED] sildenafil (REVATIO) 20 MG tablet Take 20 mg by mouth 3 (three) times daily.   No facility-administered encounter medications on file as of 09/03/2017.   :  Review of Systems:  Out of a complete 14 point review of systems, all are reviewed and negative with the exception of these symptoms as listed below: Review of Systems  Neurological:       Pt presents today to follow up on his new cpap.    Objective:  Neurological Exam  Physical Exam Physical Examination:   Vitals:   09/03/17 1141  BP: (!) 160/99  Pulse: 79    General Examination: The patient is a very pleasant 66 y.o. male in no acute distress. He appears well-developed and well-nourished and well groomed.   HEENT: Normocephalic, atraumatic, pupils are equal, round and reactive to light and accommodation. Funduscopic exam is normal with sharp disc margins noted. Possibly beginning cataracts bilaterally. Of note, he had an eye exam today and pupils are still a little dilated and sluggish to react. Extraocular tracking is good without limitation to gaze excursion or nystagmus noted. Normal smooth pursuit is noted. Hearing is grossly intact. Face is symmetric with normal facial animation and normal facial sensation. Speech is clear with no dysarthria noted. There is no hypophonia. There is no lip, neck/head, jaw or voice tremor. Neck is supple with full range of passive and active motion. There are no carotid bruits on auscultation. Oropharynx exam reveals: mild mouth dryness, adequate dental hygiene and marked airway  crowding, due to smaller airway entry, larger uvula, what appears to be either residual or regrown tonsils of 2+ bilaterally. Mallampati is class II. Neck circumference is 16-3/8  inches.  Chest: Clear to auscultation without wheezing, rhonchi or crackles noted.  Heart: S1+S2+0, regular and normal without murmurs, rubs or gallops noted.   Abdomen: Soft, non-tender and non-distended with normal bowel sounds appreciated on auscultation.  Extremities: There is no pitting edema in the distal lower extremities bilaterally. Pedal pulses are intact.  Skin: Warm and dry without trophic changes noted.  Musculoskeletal: exam reveals no obvious joint deformities, tenderness or joint swelling or erythema.   Neurologically:  Mental status: The patient is awake, alert and oriented in all 4 spheres. His immediate and remote memory, attention, language skills and fund of knowledge are appropriate. There is no evidence of aphasia, agnosia, apraxia or anomia. Speech is clear with normal prosody and enunciation. Thought process is linear. Mood is normal and affect is normal.  Cranial nerves II - XII are as described above under HEENT exam. In addition: shoulder shrug is normal with equal shoulder height noted. Motor exam: Normal bulk, strength and tone is noted. There is no tremor. Fine motor skills and coordination: grossly intact.  Cerebellar testing: No dysmetria or intention tremor. There is no truncal or gait ataxia.  Sensory exam: intact to light touch in the upper and lower extremities.  Gait, station and balance: He stands easily. No veering to one side is noted. No leaning to one side is noted. Posture is age-appropriate and stance is narrow based. Gait shows normal stride length and normal pace. No problems turning are noted.        Assessment and Plan:  In summary, David [name] is a very pleasant 67 year old male with an underlying medical history of hypothyroidism, depression, ulcerative  colitis, arthritis with status post right total knee replacement, history of renal cancer, hyperlipidemia, depression, and overweight state, who presents for follow-up consultation of his obstructive sleep apnea after recent reevaluation with a baseline sleep study, which was then followed shortly with a CPAP titration study, both studies in February 2019. His baseline sleep study indicated severe sleep apnea and he has established treatment with his new CPAP machine at a pressure of 9 cm. He is fully compliant with treatment and highly commended for this. He is doing rather well, feels that the new machine is easy to use and is also feeling better with respect to sleep consolidation, daytime somnolence and sleep interruption at night. He is advised to follow-up routinely in about a year possible. He will not be in town for the next year and a half or so and he will try to perhaps get a routine follow-up for sleep apnea while in [place]. He is advised to call for a follow-up appointment next year. We reviewed his sleep study results in detail today as well as his compliance data. I answered all his questions today and he was in agreement with the plan.

## 2017-09-03 NOTE — Patient Instructions (Addendum)
Please continue using your CPAP regularly. While your insurance requires that you use CPAP at least 4 hours each night on 70% of the nights, I recommend, that you not skip any nights and use it throughout the night if you can. Getting used to CPAP and staying with the treatment long term does take time and patience and discipline. Untreated obstructive sleep apnea when it is moderate to severe can have an adverse impact on cardiovascular health and raise her risk for heart disease, arrhythmias, hypertension, congestive heart failure, stroke and diabetes. Untreated obstructive sleep apnea causes sleep disruption, nonrestorative sleep, and sleep deprivation. This can have an impact on your day to day functioning and cause daytime sleepiness and impairment of cognitive function, memory loss, mood disturbance, and problems focussing. Using CPAP regularly can improve these symptoms.  We will see you in about a year, please call when you are ready to schedule.  You may be able to see a doctor while in [place].  Have a good trip!

## 2017-09-07 DIAGNOSIS — L821 Other seborrheic keratosis: Secondary | ICD-10-CM | POA: Diagnosis not present

## 2017-09-07 DIAGNOSIS — Z1283 Encounter for screening for malignant neoplasm of skin: Secondary | ICD-10-CM | POA: Diagnosis not present

## 2017-09-07 DIAGNOSIS — L258 Unspecified contact dermatitis due to other agents: Secondary | ICD-10-CM | POA: Diagnosis not present

## 2017-09-08 ENCOUNTER — Ambulatory Visit: Payer: Self-pay | Admitting: Neurology

## 2017-09-10 DIAGNOSIS — L237 Allergic contact dermatitis due to plants, except food: Secondary | ICD-10-CM | POA: Diagnosis not present

## 2017-09-11 DIAGNOSIS — E890 Postprocedural hypothyroidism: Secondary | ICD-10-CM | POA: Diagnosis not present

## 2017-09-11 DIAGNOSIS — E291 Testicular hypofunction: Secondary | ICD-10-CM | POA: Diagnosis not present

## 2017-09-11 DIAGNOSIS — R972 Elevated prostate specific antigen [PSA]: Secondary | ICD-10-CM | POA: Diagnosis not present

## 2017-09-17 DIAGNOSIS — G4733 Obstructive sleep apnea (adult) (pediatric): Secondary | ICD-10-CM | POA: Diagnosis not present

## 2017-10-13 DIAGNOSIS — Z23 Encounter for immunization: Secondary | ICD-10-CM | POA: Diagnosis not present

## 2017-10-17 DIAGNOSIS — G4733 Obstructive sleep apnea (adult) (pediatric): Secondary | ICD-10-CM | POA: Diagnosis not present

## 2017-10-29 DIAGNOSIS — E890 Postprocedural hypothyroidism: Secondary | ICD-10-CM | POA: Diagnosis not present

## 2017-10-29 DIAGNOSIS — N390 Urinary tract infection, site not specified: Secondary | ICD-10-CM | POA: Diagnosis not present

## 2017-10-29 DIAGNOSIS — E291 Testicular hypofunction: Secondary | ICD-10-CM | POA: Diagnosis not present

## 2017-11-17 DIAGNOSIS — G4733 Obstructive sleep apnea (adult) (pediatric): Secondary | ICD-10-CM | POA: Diagnosis not present

## 2017-11-23 DIAGNOSIS — N182 Chronic kidney disease, stage 2 (mild): Secondary | ICD-10-CM | POA: Diagnosis not present

## 2017-11-23 DIAGNOSIS — Z23 Encounter for immunization: Secondary | ICD-10-CM | POA: Diagnosis not present

## 2017-11-23 DIAGNOSIS — R739 Hyperglycemia, unspecified: Secondary | ICD-10-CM | POA: Diagnosis not present

## 2017-11-25 DIAGNOSIS — G4733 Obstructive sleep apnea (adult) (pediatric): Secondary | ICD-10-CM | POA: Diagnosis not present

## 2017-12-18 DIAGNOSIS — G4733 Obstructive sleep apnea (adult) (pediatric): Secondary | ICD-10-CM | POA: Diagnosis not present

## 2018-01-01 DIAGNOSIS — R69 Illness, unspecified: Secondary | ICD-10-CM | POA: Diagnosis not present

## 2018-01-17 DIAGNOSIS — G4733 Obstructive sleep apnea (adult) (pediatric): Secondary | ICD-10-CM | POA: Diagnosis not present

## 2018-02-17 DIAGNOSIS — G4733 Obstructive sleep apnea (adult) (pediatric): Secondary | ICD-10-CM | POA: Diagnosis not present

## 2018-02-24 DIAGNOSIS — J029 Acute pharyngitis, unspecified: Secondary | ICD-10-CM | POA: Diagnosis not present

## 2018-03-19 DIAGNOSIS — G4733 Obstructive sleep apnea (adult) (pediatric): Secondary | ICD-10-CM | POA: Diagnosis not present

## 2018-04-19 DIAGNOSIS — R69 Illness, unspecified: Secondary | ICD-10-CM | POA: Diagnosis not present

## 2018-04-21 DIAGNOSIS — Z8042 Family history of malignant neoplasm of prostate: Secondary | ICD-10-CM | POA: Diagnosis not present

## 2018-04-21 DIAGNOSIS — E890 Postprocedural hypothyroidism: Secondary | ICD-10-CM | POA: Diagnosis not present

## 2018-04-21 DIAGNOSIS — Z79899 Other long term (current) drug therapy: Secondary | ICD-10-CM | POA: Diagnosis not present

## 2018-04-21 DIAGNOSIS — R7989 Other specified abnormal findings of blood chemistry: Secondary | ICD-10-CM | POA: Diagnosis not present

## 2018-04-21 DIAGNOSIS — Z125 Encounter for screening for malignant neoplasm of prostate: Secondary | ICD-10-CM | POA: Diagnosis not present

## 2018-04-21 DIAGNOSIS — Z932 Ileostomy status: Secondary | ICD-10-CM | POA: Diagnosis not present

## 2018-04-21 DIAGNOSIS — Z85528 Personal history of other malignant neoplasm of kidney: Secondary | ICD-10-CM | POA: Diagnosis not present

## 2018-04-21 DIAGNOSIS — R5383 Other fatigue: Secondary | ICD-10-CM | POA: Diagnosis not present

## 2018-04-21 DIAGNOSIS — R030 Elevated blood-pressure reading, without diagnosis of hypertension: Secondary | ICD-10-CM | POA: Diagnosis not present

## 2018-04-23 DIAGNOSIS — G4733 Obstructive sleep apnea (adult) (pediatric): Secondary | ICD-10-CM | POA: Diagnosis not present

## 2018-05-13 DIAGNOSIS — Z932 Ileostomy status: Secondary | ICD-10-CM | POA: Diagnosis not present

## 2018-05-13 DIAGNOSIS — E291 Testicular hypofunction: Secondary | ICD-10-CM | POA: Diagnosis not present

## 2018-05-13 DIAGNOSIS — I10 Essential (primary) hypertension: Secondary | ICD-10-CM | POA: Diagnosis not present

## 2018-05-13 DIAGNOSIS — C61 Malignant neoplasm of prostate: Secondary | ICD-10-CM | POA: Diagnosis not present

## 2018-05-13 DIAGNOSIS — E039 Hypothyroidism, unspecified: Secondary | ICD-10-CM | POA: Diagnosis not present

## 2018-05-13 DIAGNOSIS — K5190 Ulcerative colitis, unspecified, without complications: Secondary | ICD-10-CM | POA: Diagnosis not present

## 2018-05-14 DIAGNOSIS — E291 Testicular hypofunction: Secondary | ICD-10-CM | POA: Diagnosis not present

## 2018-05-14 DIAGNOSIS — E039 Hypothyroidism, unspecified: Secondary | ICD-10-CM | POA: Diagnosis not present

## 2018-05-17 DIAGNOSIS — Z79899 Other long term (current) drug therapy: Secondary | ICD-10-CM | POA: Diagnosis not present

## 2018-05-17 DIAGNOSIS — I10 Essential (primary) hypertension: Secondary | ICD-10-CM | POA: Diagnosis not present

## 2018-05-17 DIAGNOSIS — R5383 Other fatigue: Secondary | ICD-10-CM | POA: Diagnosis not present

## 2018-05-17 DIAGNOSIS — E890 Postprocedural hypothyroidism: Secondary | ICD-10-CM | POA: Diagnosis not present

## 2018-05-17 DIAGNOSIS — R7989 Other specified abnormal findings of blood chemistry: Secondary | ICD-10-CM | POA: Diagnosis not present

## 2018-05-31 DIAGNOSIS — E890 Postprocedural hypothyroidism: Secondary | ICD-10-CM | POA: Diagnosis not present

## 2018-05-31 DIAGNOSIS — Z789 Other specified health status: Secondary | ICD-10-CM | POA: Diagnosis not present

## 2018-05-31 DIAGNOSIS — R7989 Other specified abnormal findings of blood chemistry: Secondary | ICD-10-CM | POA: Diagnosis not present

## 2018-05-31 DIAGNOSIS — I10 Essential (primary) hypertension: Secondary | ICD-10-CM | POA: Diagnosis not present

## 2018-05-31 DIAGNOSIS — R69 Illness, unspecified: Secondary | ICD-10-CM | POA: Diagnosis not present

## 2018-06-21 DIAGNOSIS — Z125 Encounter for screening for malignant neoplasm of prostate: Secondary | ICD-10-CM | POA: Diagnosis not present

## 2018-06-21 DIAGNOSIS — E890 Postprocedural hypothyroidism: Secondary | ICD-10-CM | POA: Diagnosis not present

## 2018-06-25 DIAGNOSIS — E291 Testicular hypofunction: Secondary | ICD-10-CM | POA: Diagnosis not present

## 2018-06-25 DIAGNOSIS — R972 Elevated prostate specific antigen [PSA]: Secondary | ICD-10-CM | POA: Diagnosis not present

## 2018-08-04 DIAGNOSIS — R972 Elevated prostate specific antigen [PSA]: Secondary | ICD-10-CM | POA: Diagnosis not present

## 2018-08-04 DIAGNOSIS — E039 Hypothyroidism, unspecified: Secondary | ICD-10-CM | POA: Diagnosis not present

## 2018-08-04 DIAGNOSIS — E291 Testicular hypofunction: Secondary | ICD-10-CM | POA: Diagnosis not present

## 2018-08-09 DIAGNOSIS — I10 Essential (primary) hypertension: Secondary | ICD-10-CM | POA: Diagnosis not present

## 2018-08-09 DIAGNOSIS — K51918 Ulcerative colitis, unspecified with other complication: Secondary | ICD-10-CM | POA: Diagnosis not present

## 2018-08-09 DIAGNOSIS — E782 Mixed hyperlipidemia: Secondary | ICD-10-CM | POA: Diagnosis not present

## 2018-08-09 DIAGNOSIS — E291 Testicular hypofunction: Secondary | ICD-10-CM | POA: Diagnosis not present

## 2018-08-09 DIAGNOSIS — E039 Hypothyroidism, unspecified: Secondary | ICD-10-CM | POA: Diagnosis not present

## 2018-08-09 DIAGNOSIS — N529 Male erectile dysfunction, unspecified: Secondary | ICD-10-CM | POA: Diagnosis not present

## 2018-08-09 DIAGNOSIS — R69 Illness, unspecified: Secondary | ICD-10-CM | POA: Diagnosis not present

## 2018-08-09 DIAGNOSIS — Z932 Ileostomy status: Secondary | ICD-10-CM | POA: Diagnosis not present

## 2018-09-02 DIAGNOSIS — G4700 Insomnia, unspecified: Secondary | ICD-10-CM | POA: Diagnosis not present

## 2018-09-02 DIAGNOSIS — D490 Neoplasm of unspecified behavior of digestive system: Secondary | ICD-10-CM | POA: Diagnosis not present

## 2018-09-17 ENCOUNTER — Other Ambulatory Visit: Payer: Self-pay | Admitting: Surgery

## 2018-09-17 DIAGNOSIS — K6389 Other specified diseases of intestine: Secondary | ICD-10-CM | POA: Diagnosis not present

## 2018-09-17 DIAGNOSIS — Z8719 Personal history of other diseases of the digestive system: Secondary | ICD-10-CM | POA: Diagnosis not present

## 2018-09-17 DIAGNOSIS — Z932 Ileostomy status: Secondary | ICD-10-CM | POA: Diagnosis not present

## 2018-10-05 DIAGNOSIS — G4733 Obstructive sleep apnea (adult) (pediatric): Secondary | ICD-10-CM | POA: Diagnosis not present

## 2018-10-06 DIAGNOSIS — E890 Postprocedural hypothyroidism: Secondary | ICD-10-CM | POA: Diagnosis not present

## 2018-10-06 DIAGNOSIS — N522 Drug-induced erectile dysfunction: Secondary | ICD-10-CM | POA: Diagnosis not present

## 2018-10-06 DIAGNOSIS — I10 Essential (primary) hypertension: Secondary | ICD-10-CM | POA: Diagnosis not present

## 2018-10-06 DIAGNOSIS — G629 Polyneuropathy, unspecified: Secondary | ICD-10-CM | POA: Diagnosis not present

## 2018-11-08 DIAGNOSIS — R202 Paresthesia of skin: Secondary | ICD-10-CM | POA: Diagnosis not present

## 2018-11-09 DIAGNOSIS — R899 Unspecified abnormal finding in specimens from other organs, systems and tissues: Secondary | ICD-10-CM | POA: Diagnosis not present

## 2018-11-09 DIAGNOSIS — R202 Paresthesia of skin: Secondary | ICD-10-CM | POA: Diagnosis not present

## 2018-11-09 DIAGNOSIS — M5416 Radiculopathy, lumbar region: Secondary | ICD-10-CM | POA: Diagnosis not present

## 2018-11-10 ENCOUNTER — Telehealth: Payer: Self-pay | Admitting: Neurology

## 2018-11-10 NOTE — Telephone Encounter (Signed)
I called pt. He wants to switch DMEs to get supplies. He reports that [facility] has not been helpful.  Unfortunately, pt has not been seen in over one year and new orders cannot be generated. I gave him the name and numbers for [facility] for him to research. He is agreeable to a virtual visit with Amy, NP on 11/16/18 at 8:00am for new cpap supply orders. He is in [place] for the next several months.  He cannot update his chart with me at this time.   Pt asked that I text him the link to the mychart sign up which I did. He reports that he is familiar with virtual visits.

## 2018-11-10 NOTE — Telephone Encounter (Signed)
Pt is asking for a call re: companies other than [facility] that he may get his CPAP supplies from, please call

## 2018-11-10 NOTE — Telephone Encounter (Signed)
Noted  

## 2018-11-12 DIAGNOSIS — M5416 Radiculopathy, lumbar region: Secondary | ICD-10-CM | POA: Diagnosis not present

## 2018-11-16 ENCOUNTER — Encounter: Payer: Self-pay | Admitting: Family Medicine

## 2018-11-16 ENCOUNTER — Telehealth: Payer: BLUE CROSS/BLUE SHIELD | Admitting: Family

## 2018-11-16 ENCOUNTER — Telehealth: Payer: Self-pay | Admitting: Family Medicine

## 2018-11-16 ENCOUNTER — Telehealth (INDEPENDENT_AMBULATORY_CARE_PROVIDER_SITE_OTHER): Payer: Medicare HMO | Admitting: Family Medicine

## 2018-11-16 DIAGNOSIS — R05 Cough: Secondary | ICD-10-CM | POA: Diagnosis not present

## 2018-11-16 DIAGNOSIS — G4733 Obstructive sleep apnea (adult) (pediatric): Secondary | ICD-10-CM

## 2018-11-16 DIAGNOSIS — R059 Cough, unspecified: Secondary | ICD-10-CM

## 2018-11-16 MED ORDER — BENZONATATE 100 MG PO CAPS: 100.0000 mg | ORAL_CAPSULE | Freq: Two times a day (BID) | ORAL | 0 refills | Status: DC | PRN

## 2018-11-16 NOTE — Progress Notes (Addendum)
   PATIENT: David [name] DOB: [birth year]  REASON FOR VISIT: follow up HISTORY FROM: patient  Virtual Visit via Telephone Note  I connected with David [name] on 11/16/18 at 11:30 AM EDT by telephone and verified that I am speaking with the correct person using two identifiers.   I discussed the limitations, risks, security and privacy concerns of performing an evaluation and management service by telephone and the availability of in person appointments. I also discussed with the patient that there may be a patient responsible charge related to this service. The patient expressed understanding and agreed to proceed.   History of Present Illness:  11/16/18 David [name] is a 68 y.o. male here today for follow up of OSA on CPAP.  He reports that he is doing well on CPAP therapy.  Compliance report dated 10/15/2018 through 11/13/2018 reveals that he is using CPAP every night for compliance of 100%.  Every night he uses CPAP for greater than 4 hours.  Average usage is 8 hours 22 minutes.  AHI was 1.3 on 9 cm of water and EPR of 1.  There is no significant leak.   Observations/Objective:  Generalized: Well developed, in no acute distress  Mentation: Alert oriented to time, place, history taking. Follows all commands speech and language fluent   Assessment and Plan:  68 y.o. 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 here with    ICD-10-CM   1. Obstructive sleep apnea  G47.33    David [name] is doing very well with therapy.  Compliance report reveals excellent compliance.  He was encouraged to continue using CPAP  nightly and for greater than 4 hours each night.  We will follow-up annually, sooner if needed.  He verbalizes understanding and agreement with this plan.   No orders of the defined types were placed in this encounter.   No orders of the defined types were placed in this encounter.    Follow Up Instructions:  I discussed the assessment and treatment plan with the patient. The patient was provided an opportunity to ask questions and all were answered. The patient agreed with the plan and demonstrated an understanding of the instructions.   The patient was advised to call back or seek an in-person evaluation if the symptoms worsen or if the condition fails to improve as anticipated.  I provided 15 minutes of non-face-to-face time during this encounter.  Patient is located at his place of employment during my chart visit.  Provider is in the office.   David Presto, NP   I reviewed the above note and documentation by the Nurse Practitioner and agree with the history, exam, assessment and plan as outlined above. I was available for consultation. David Age, MD, PhD [name])

## 2018-11-16 NOTE — Progress Notes (Signed)
We are sorry that you are not feeling well.  Here is how we plan to help!  Based on your presentation I believe you most likely have A cough due to a virus.  This is called viral bronchitis and is best treated by rest, plenty of fluids and control of the cough.  You may use Ibuprofen or Tylenol as directed to help your symptoms.     In addition you may use A prescription cough medication called Tessalon Perles 100mg. You may take 1-2 capsules every 8 hours as needed for your cough.    From your responses in the eVisit questionnaire you describe inflammation in the upper respiratory tract which is causing a significant cough.  This is commonly called Bronchitis and has four common causes:    Allergies  Viral Infections  Acid Reflux  Bacterial Infection Allergies, viruses and acid reflux are treated by controlling symptoms or eliminating the cause. An example might be a cough caused by taking certain blood pressure medications. You stop the cough by changing the medication. Another example might be a cough caused by acid reflux. Controlling the reflux helps control the cough.  USE OF BRONCHODILATOR ("RESCUE") INHALERS: There is a risk from using your bronchodilator too frequently.  The risk is that over-reliance on a medication which only relaxes the muscles surrounding the breathing tubes can reduce the effectiveness of medications prescribed to reduce swelling and congestion of the tubes themselves.  Although you feel brief relief from the bronchodilator inhaler, your asthma may actually be worsening with the tubes becoming more swollen and filled with mucus.  This can delay other crucial treatments, such as oral steroid medications. If you need to use a bronchodilator inhaler daily, several times per day, you should discuss this with your provider.  There are probably better treatments that could be used to keep your asthma under control.     HOME CARE . Only take medications as instructed by  your medical team. . Complete the entire course of an antibiotic. . Drink plenty of fluids and get plenty of rest. . Avoid close contacts especially the very young and the elderly . Cover your mouth if you cough or cough into your sleeve. . Always remember to wash your hands . A steam or ultrasonic humidifier can help congestion.   GET HELP RIGHT AWAY IF: . You develop worsening fever. . You become short of breath . You cough up blood. . Your symptoms persist after you have completed your treatment plan MAKE SURE YOU   Understand these instructions.  Will watch your condition.  Will get help right away if you are not doing well or get worse.  Your e-visit answers were reviewed by a board certified advanced clinical practitioner to complete your personal care plan.  Depending on the condition, your plan could have included both over the counter or prescription medications. If there is a problem please reply  once you have received a response from your provider. Your safety is important to us.  If you have drug allergies check your prescription carefully.    You can use MyChart to ask questions about today's visit, request a non-urgent call back, or ask for a work or school excuse for 24 hours related to this e-Visit. If it has been greater than 24 hours you will need to follow up with your provider, or enter a new e-Visit to address those concerns. You will get an e-mail in the next two days asking about your experience.    I hope that your e-visit has been valuable and will speed your recovery. Thank you for using e-visits. Greater than 5 minutes, yet less than 10 minutes of time have been spent researching, coordinating, and implementing care for this patient today.  Thank you for the details you included in the comment boxes. Those details are very helpful in determining the best course of treatment for you and help [place] to provide the best care.

## 2018-12-01 DIAGNOSIS — H04123 Dry eye syndrome of bilateral lacrimal glands: Secondary | ICD-10-CM | POA: Diagnosis not present

## 2018-12-01 DIAGNOSIS — R202 Paresthesia of skin: Secondary | ICD-10-CM | POA: Diagnosis not present

## 2018-12-01 DIAGNOSIS — R768 Other specified abnormal immunological findings in serum: Secondary | ICD-10-CM | POA: Diagnosis not present

## 2018-12-01 DIAGNOSIS — K51919 Ulcerative colitis, unspecified with unspecified complications: Secondary | ICD-10-CM | POA: Diagnosis not present

## 2018-12-03 DIAGNOSIS — R202 Paresthesia of skin: Secondary | ICD-10-CM | POA: Diagnosis not present

## 2018-12-05 DIAGNOSIS — S96911A Strain of unspecified muscle and tendon at ankle and foot level, right foot, initial encounter: Secondary | ICD-10-CM | POA: Diagnosis not present

## 2018-12-05 DIAGNOSIS — M79671 Pain in right foot: Secondary | ICD-10-CM | POA: Diagnosis not present

## 2018-12-05 DIAGNOSIS — X58XXXA Exposure to other specified factors, initial encounter: Secondary | ICD-10-CM | POA: Diagnosis not present

## 2018-12-10 DIAGNOSIS — G4733 Obstructive sleep apnea (adult) (pediatric): Secondary | ICD-10-CM | POA: Diagnosis not present

## 2018-12-22 ENCOUNTER — Telehealth: Payer: Self-pay | Admitting: Neurology

## 2018-12-22 NOTE — Telephone Encounter (Signed)
Dr. Jannifer Franklin, would you be alright with seeing this patient? Please view previous phone notes for more info. Needs to see neuromuscular specialist.

## 2018-12-22 NOTE — Telephone Encounter (Signed)
Yes, okay with me.

## 2018-12-22 NOTE — Telephone Encounter (Signed)
Pt is being referred back to [place] for Paresthesia of both feet. He has seen Dr. Rexene Alberts in the past for sleep but is requesting to see Dr. Brett Fairy for this. Would you both be ok with this?

## 2018-12-22 NOTE — Telephone Encounter (Signed)
I am not well versed in peripheral neurology, and would have to decline- sleep medicine and CNS disorders are my speciality.  We have 4 neuromuscular- peripheral neurologists in this practice, and they would address the issues much better. I have left a VM with the patient, to tell me more about his needs and concerns and we can see together if I would be the best fit.  Larey Seat, MD

## 2019-01-05 DIAGNOSIS — H04123 Dry eye syndrome of bilateral lacrimal glands: Secondary | ICD-10-CM | POA: Diagnosis not present

## 2019-01-05 DIAGNOSIS — R768 Other specified abnormal immunological findings in serum: Secondary | ICD-10-CM | POA: Diagnosis not present

## 2019-01-05 DIAGNOSIS — K51919 Ulcerative colitis, unspecified with unspecified complications: Secondary | ICD-10-CM | POA: Diagnosis not present

## 2019-01-05 DIAGNOSIS — R202 Paresthesia of skin: Secondary | ICD-10-CM | POA: Diagnosis not present

## 2019-01-07 DIAGNOSIS — Z6831 Body mass index (BMI) 31.0-31.9, adult: Secondary | ICD-10-CM | POA: Diagnosis not present

## 2019-01-07 DIAGNOSIS — G629 Polyneuropathy, unspecified: Secondary | ICD-10-CM | POA: Diagnosis not present

## 2019-01-28 DIAGNOSIS — H5203 Hypermetropia, bilateral: Secondary | ICD-10-CM | POA: Diagnosis not present

## 2019-02-01 DIAGNOSIS — R972 Elevated prostate specific antigen [PSA]: Secondary | ICD-10-CM | POA: Diagnosis not present

## 2019-02-01 DIAGNOSIS — Z85528 Personal history of other malignant neoplasm of kidney: Secondary | ICD-10-CM | POA: Diagnosis not present

## 2019-02-01 DIAGNOSIS — N5201 Erectile dysfunction due to arterial insufficiency: Secondary | ICD-10-CM | POA: Diagnosis not present

## 2019-02-01 DIAGNOSIS — Z87442 Personal history of urinary calculi: Secondary | ICD-10-CM | POA: Diagnosis not present

## 2019-02-01 DIAGNOSIS — N200 Calculus of kidney: Secondary | ICD-10-CM | POA: Diagnosis not present

## 2019-02-02 ENCOUNTER — Other Ambulatory Visit (HOSPITAL_COMMUNITY): Payer: Self-pay | Admitting: Urology

## 2019-02-02 ENCOUNTER — Ambulatory Visit: Payer: Medicare HMO | Admitting: Neurology

## 2019-02-02 ENCOUNTER — Other Ambulatory Visit: Payer: Self-pay | Admitting: Urology

## 2019-02-02 DIAGNOSIS — E291 Testicular hypofunction: Secondary | ICD-10-CM | POA: Diagnosis not present

## 2019-02-02 DIAGNOSIS — E349 Endocrine disorder, unspecified: Secondary | ICD-10-CM | POA: Diagnosis not present

## 2019-02-02 DIAGNOSIS — E280 Estrogen excess: Secondary | ICD-10-CM | POA: Diagnosis not present

## 2019-02-02 DIAGNOSIS — E782 Mixed hyperlipidemia: Secondary | ICD-10-CM | POA: Diagnosis not present

## 2019-02-02 DIAGNOSIS — Z23 Encounter for immunization: Secondary | ICD-10-CM | POA: Diagnosis not present

## 2019-02-02 DIAGNOSIS — R9341 Abnormal radiologic findings on diagnostic imaging of renal pelvis, ureter, or bladder: Secondary | ICD-10-CM

## 2019-02-02 DIAGNOSIS — G629 Polyneuropathy, unspecified: Secondary | ICD-10-CM | POA: Diagnosis not present

## 2019-02-02 DIAGNOSIS — Z125 Encounter for screening for malignant neoplasm of prostate: Secondary | ICD-10-CM | POA: Diagnosis not present

## 2019-02-02 DIAGNOSIS — I10 Essential (primary) hypertension: Secondary | ICD-10-CM | POA: Diagnosis not present

## 2019-02-02 DIAGNOSIS — E039 Hypothyroidism, unspecified: Secondary | ICD-10-CM | POA: Diagnosis not present

## 2019-02-02 DIAGNOSIS — Z932 Ileostomy status: Secondary | ICD-10-CM | POA: Diagnosis not present

## 2019-02-02 DIAGNOSIS — Z0000 Encounter for general adult medical examination without abnormal findings: Secondary | ICD-10-CM | POA: Diagnosis not present

## 2019-02-08 DIAGNOSIS — K5190 Ulcerative colitis, unspecified, without complications: Secondary | ICD-10-CM | POA: Diagnosis not present

## 2019-02-08 DIAGNOSIS — Z932 Ileostomy status: Secondary | ICD-10-CM | POA: Diagnosis not present

## 2019-02-08 DIAGNOSIS — E291 Testicular hypofunction: Secondary | ICD-10-CM | POA: Diagnosis not present

## 2019-02-08 DIAGNOSIS — I10 Essential (primary) hypertension: Secondary | ICD-10-CM | POA: Diagnosis not present

## 2019-02-08 DIAGNOSIS — C61 Malignant neoplasm of prostate: Secondary | ICD-10-CM | POA: Diagnosis not present

## 2019-02-08 DIAGNOSIS — E039 Hypothyroidism, unspecified: Secondary | ICD-10-CM | POA: Diagnosis not present

## 2019-02-08 DIAGNOSIS — R69 Illness, unspecified: Secondary | ICD-10-CM | POA: Diagnosis not present

## 2019-02-08 DIAGNOSIS — E890 Postprocedural hypothyroidism: Secondary | ICD-10-CM | POA: Diagnosis not present

## 2019-02-10 ENCOUNTER — Other Ambulatory Visit: Payer: Self-pay | Admitting: *Deleted

## 2019-02-10 ENCOUNTER — Encounter: Payer: Self-pay | Admitting: *Deleted

## 2019-02-11 ENCOUNTER — Other Ambulatory Visit: Payer: Self-pay

## 2019-02-11 ENCOUNTER — Ambulatory Visit (HOSPITAL_COMMUNITY)
Admission: RE | Admit: 2019-02-11 | Discharge: 2019-02-11 | Disposition: A | Discharge status: Home / Self Care | Payer: Medicare HMO | Source: Ambulatory Visit | Attending: Urology | Admitting: Urology

## 2019-02-11 DIAGNOSIS — N289 Disorder of kidney and ureter, unspecified: Secondary | ICD-10-CM | POA: Diagnosis not present

## 2019-02-11 DIAGNOSIS — R9341 Abnormal radiologic findings on diagnostic imaging of renal pelvis, ureter, or bladder: Secondary | ICD-10-CM | POA: Insufficient documentation

## 2019-02-11 LAB — POCT I-STAT CREATININE: Creatinine, Ser: 1.5 mg/dL — ABNORMAL HIGH (ref 0.61–1.24)

## 2019-02-11 MED ORDER — GADOBUTROL 1 MMOL/ML IV SOLN
10.0000 mL | Freq: Once | INTRAVENOUS | Status: AC | PRN
  Administered 2019-02-11: 15:00:00 10 mL via INTRAVENOUS

## 2019-02-14 ENCOUNTER — Telehealth: Payer: Self-pay | Admitting: Neurology

## 2019-02-14 ENCOUNTER — Ambulatory Visit: Payer: Medicare HMO | Admitting: Neurology

## 2019-02-14 ENCOUNTER — Encounter: Payer: Self-pay | Admitting: Neurology

## 2019-02-14 ENCOUNTER — Other Ambulatory Visit: Payer: Self-pay

## 2019-02-14 VITALS — BP 132/84 | HR 66 | Temp 97.8°F | Ht 68.0 in | Wt 199.5 lb

## 2019-02-14 DIAGNOSIS — G4733 Obstructive sleep apnea (adult) (pediatric): Secondary | ICD-10-CM | POA: Diagnosis not present

## 2019-02-14 DIAGNOSIS — R202 Paresthesia of skin: Secondary | ICD-10-CM | POA: Diagnosis not present

## 2019-02-14 MED ORDER — PREGABALIN 50 MG PO CAPS: ORAL_CAPSULE | ORAL | 3 refills | Status: DC

## 2019-02-14 MED ORDER — LIDOCAINE-PRILOCAINE 2.5-2.5 % EX CREA: 1.0000 "application " | TOPICAL_CREAM | Freq: Two times a day (BID) | CUTANEOUS | 3 refills | Status: DC | PRN

## 2019-02-14 NOTE — Telephone Encounter (Signed)
[employer] medicare order sent to [facility]. They will obtain the auth and reach out to the patient to schedule.  °

## 2019-02-14 NOTE — Telephone Encounter (Signed)
Pt has called stating he has been told that a prior authorization is needed for the lidocaine-prilocaine (EMLA) cream

## 2019-02-14 NOTE — Patient Instructions (Signed)
We will go up on the Lyrica to 50 mg in the morning and 100 mg in the evening.  Lyrica (pregabalin) may cause drowsiness, gait instability, ankle swelling, or cognitive slowing as well as possible dizziness. If any significant side effects occur associated with this medication, please contact our office.

## 2019-02-14 NOTE — Telephone Encounter (Signed)
(  Key: [facility])  Your information has been submitted to [place] [employer] Part D. [employer] will review the request and will issue a decision, typically within 1-3 days from your submission. You can check the updated outcome later by reopening this request.  If [employer] has not responded in 1-3 days or if you have any questions about your ePA request, please contact [place] [employer] Part D at [phone]. If you think there may be a problem with your PA request, use our live chat feature at the bottom right.

## 2019-02-14 NOTE — Telephone Encounter (Signed)
Pt notified PA has been submitted.

## 2019-02-14 NOTE — Progress Notes (Signed)
Reason for visit: Paresthesias  Referring physician: Dr. Ella Jubilee David [name] is a 68 y.o. male  History of present illness:  David [name] is a 68 year old right-handed white male with a history of paresthesias in the feet dating back 4 or 5 years.  The symptoms have significantly worsened however over the last 1 year.  He has significant burning sensations that is temperature sensitive, when he is in warmer conditions his discomfort is quite severe.  He has to keep his feet cold in order to improve the symptoms.  He denies any balance problems or weakness.  He does have occasional low back pain but this is not a significant issue for him, he denies any sciatica.  He reports no difficulty controlling the bowels or the bladder.  He has no neck pain or pain down the arms or any numbness in the hands.  In the past, he has tried Cymbalta and nortriptyline, these have resulted in impotence.  He has gone off these medications.  He was on gabapentin but this resulted in some drowsiness, he is currently on Lyrica taking 50 mg twice daily and seems to tolerate this fairly well.  The patient has undergone blood work through an evaluation in [place] for his neuropathy, the blood work was unremarkable and included a vitamin B12 level, copper level, zinc level, immunoelectrophoresis, C-reactive protein, and an ANA.  The patient has a positive ANA with slightly elevated RNP antibodies.  Sedimentation rate was normal.  Vitamin B6 was normal.  He underwent nerve conduction studies and EMG evaluation, the studies were normal, the patient was told he probably had a small fiber neuropathy.  The patient is sent to this office for further evaluation.  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    OA (osteoarthritis) of knee    both   OSA on CPAP    Personal history of asthma    childhood   Right nephrolithiasis    non-obstuctive per ct 03-14-2015   Right ureteral stone    S/P ileostomy ([place])    UC (ulcerative colitis) ([place])     Past Surgical History:  Procedure Laterality Date   APPENDECTOMY  1981   CYSTO/  RIGHT RETROGRADE PYELOGRAM/  RIGHT STENT PLACEMENT  09-23-2006   KNEE ARTHROSCOPY Left 01/01/2015   Procedure: ARTHROSCOPY KNEE;  Surgeon: Elsie Saas, MD;  Location: [facility];  Service: Orthopedics;  Laterality: Left;   PARTIAL NEPHRECTOMY Left 2003   TONSILLECTOMY  1990's   TOTAL KNEE ARTHROPLASTY Left 01/01/2015   Procedure: TOTAL KNEE ARTHROPLASTY;  Surgeon: Elsie Saas, MD;  Location: [facility];  Service: Orthopedics;  Laterality: Left;   TOTAL KNEE ARTHROPLASTY Right 02/12/2015   Procedure: TOTAL RIGHT KNEE ARTHROPLASTY;  Surgeon: Elsie Saas, MD;  Location: [facility];  Service: Orthopedics;  Laterality: Right;   TOTAL PROCTOCOLECTOMY W/ END ILEOSTOMY  1981    Family History  Problem Relation Age of Onset   Cancer Father        thyroid   Prostate  cancer Brother     Social history:  reports that he has never smoked. He has never used smokeless tobacco. He reports that he does not drink alcohol or use drugs.  Medications:  Prior to Admission medications   Medication Sig Start Date End Date Taking? Authorizing Provider  aspirin EC 81 MG tablet Take 81 mg by mouth daily.   Yes [provider]  levothyroxine (SYNTHROID) 112 MCG tablet Take 112 mcg by mouth daily. 11/06/18  Yes [provider]  mometasone (ELOCON) 0.1 % cream Apply 1 application topically daily.   Yes [provider]  Multiple Vitamin (MULTIVITAMIN WITH MINERALS) TABS Take 1  tablet by mouth daily.   Yes [provider]  temazepam (RESTORIL) 7.5 MG capsule Take 7.5 mg by mouth at bedtime as needed for sleep.   Yes [provider]  gabapentin (NEURONTIN) 300 MG capsule 300 mg 2 (two) times daily. 11/08/18   [provider]  lisinopril (ZESTRIL) 10 MG tablet Take 10 mg by mouth daily. 02/02/19   [provider]  nortriptyline (PAMELOR) 25 MG capsule TAKE 1 CAPSULE BY MOUTH EVERYDAY AT BEDTIME 10/06/18   [provider]  pregabalin (LYRICA) 50 MG capsule 50 mg 2 (two) times daily. 12/10/18   [provider]  tamsulosin (FLOMAX) 0.4 MG CAPS capsule Take 0.4 mg by mouth as needed.     [provider]     No Known Allergies  ROS:  Out of a complete 14 system review of symptoms, the patient complains only of the following symptoms, and all other reviewed systems are negative.  Foot pain, burning Insomnia  Blood pressure 132/84, pulse 66, temperature 97.8 F (36.6 C), temperature source Temporal, height 5' 8"  (1.727 m), weight 199 lb 8 oz (90.5 kg).  Physical Exam  General: The patient is alert and cooperative at the time of the examination.  Eyes: Pupils are equal, round, and reactive to light. Discs are flat bilaterally.  Neck: The neck is supple, no carotid bruits are noted.  Respiratory: The respiratory examination is clear.  Cardiovascular: The cardiovascular examination reveals a regular rate and rhythm, no obvious murmurs or rubs are noted.  Skin: Extremities are with 1+ edema at the feet bilaterally.  Neurologic Exam  Mental status: The patient is alert and oriented x 3 at the time of the examination. The patient has apparent normal recent and remote memory, with an apparently normal attention span and concentration ability.  Cranial nerves: Facial symmetry is present. There is good sensation of the face to pinprick and soft touch bilaterally. The strength of the facial muscles and the muscles  to head turning and shoulder shrug are normal bilaterally. Speech is well enunciated, no aphasia or dysarthria is noted. Extraocular movements are full. Visual fields are full. The tongue is midline, and the patient has symmetric elevation of the soft palate. No obvious hearing deficits are noted.  Motor: The motor testing reveals 5 over 5 strength of all 4 extremities. Good symmetric motor tone is noted throughout.  Sensory: Sensory testing is intact to pinprick, soft touch, vibration sensation, and position sense on all 4 extremities, with the exception that there is a pinprick sensory deficit across the ankles bilaterally. No evidence of extinction is noted.  Coordination: Cerebellar testing reveals good finger-nose-finger and heel-to-shin bilaterally.  Gait and station: Gait is normal. Tandem gait is normal. Romberg is negative. No drift is seen.  Reflexes: Deep tendon reflexes are symmetric and normal bilaterally, with exception that  the ankle jerk reflexes are depressed bilaterally. Toes are downgoing bilaterally.   Assessment/Plan:  1.  Dysesthesias, burning in the feet, probable small fiber neuropathy  The patient will be sent for MRI of the lumbar spine to exclude spinal stenosis as an etiology for her symptoms.  He will be set up for skin biopsy to help confirm the diagnosis of a small fiber neuropathy and to exclude the possibility of amyloidosis.  The patient will be increased on the Lyrica taking 50 mg in the morning and 100 mg in the evening, he will call for any dose adjustments in the future.  A prescription was sent in.  A prescription was given for EMLA cream and the possibility of using capsaicin for his neuropathy was also discussed.  He will follow-up here in about 4 months.  Jill Alexanders MD 02/14/2019 12:18 PM  [facility] [address] [place], [place] [address]  Phone [phone] Fax [phone]

## 2019-02-15 DIAGNOSIS — G479 Sleep disorder, unspecified: Secondary | ICD-10-CM | POA: Diagnosis not present

## 2019-02-15 DIAGNOSIS — Z808 Family history of malignant neoplasm of other organs or systems: Secondary | ICD-10-CM | POA: Diagnosis not present

## 2019-02-15 MED ORDER — LIDOCAINE 4 % EX GEL: 1.0000 g | Freq: Two times a day (BID) | CUTANEOUS | 3 refills | Status: DC | PRN

## 2019-02-15 NOTE — Telephone Encounter (Signed)
PA response received.  PA has been denied. [place] medicare will only cover medication for topical anesthesia. Dx of neuropathy is not covered. Will fwd to MD to review and advise further.

## 2019-02-15 NOTE — Addendum Note (Signed)
Addended by: Kathrynn Ducking on: 02/15/2019 11:43 AM   Modules accepted: Orders

## 2019-02-15 NOTE — Telephone Encounter (Signed)
I reached out to the pt and advised of medication change. Pt verbalized understanding. He reports the pharmacy advised him the Elma cream may cost 35 $ and if so he would purchase w/u insurance.  Pt was instructed to call back once he determines which medication he will pick up. Pt was agreeable.

## 2019-02-15 NOTE — Telephone Encounter (Signed)
I will order lidocaine topical instead.

## 2019-02-17 ENCOUNTER — Other Ambulatory Visit: Payer: Self-pay | Admitting: Family Medicine

## 2019-02-17 ENCOUNTER — Ambulatory Visit
Admission: RE | Admit: 2019-02-17 | Discharge: 2019-02-17 | Disposition: A | Discharge status: Home / Self Care | Payer: Medicare HMO | Source: Ambulatory Visit | Attending: Family Medicine | Admitting: Family Medicine

## 2019-02-17 DIAGNOSIS — Z808 Family history of malignant neoplasm of other organs or systems: Secondary | ICD-10-CM

## 2019-02-17 DIAGNOSIS — E041 Nontoxic single thyroid nodule: Secondary | ICD-10-CM | POA: Diagnosis not present

## 2019-02-21 DIAGNOSIS — M5135 Other intervertebral disc degeneration, thoracolumbar region: Secondary | ICD-10-CM | POA: Diagnosis not present

## 2019-02-21 DIAGNOSIS — M5136 Other intervertebral disc degeneration, lumbar region: Secondary | ICD-10-CM | POA: Diagnosis not present

## 2019-02-21 DIAGNOSIS — M9903 Segmental and somatic dysfunction of lumbar region: Secondary | ICD-10-CM | POA: Diagnosis not present

## 2019-02-21 DIAGNOSIS — M9902 Segmental and somatic dysfunction of thoracic region: Secondary | ICD-10-CM | POA: Diagnosis not present

## 2019-02-21 DIAGNOSIS — M9904 Segmental and somatic dysfunction of sacral region: Secondary | ICD-10-CM | POA: Diagnosis not present

## 2019-02-21 DIAGNOSIS — M5137 Other intervertebral disc degeneration, lumbosacral region: Secondary | ICD-10-CM | POA: Diagnosis not present

## 2019-03-01 DIAGNOSIS — N529 Male erectile dysfunction, unspecified: Secondary | ICD-10-CM | POA: Diagnosis not present

## 2019-03-01 DIAGNOSIS — E291 Testicular hypofunction: Secondary | ICD-10-CM | POA: Diagnosis not present

## 2019-03-01 DIAGNOSIS — E039 Hypothyroidism, unspecified: Secondary | ICD-10-CM | POA: Diagnosis not present

## 2019-03-01 DIAGNOSIS — G479 Sleep disorder, unspecified: Secondary | ICD-10-CM | POA: Diagnosis not present

## 2019-03-01 DIAGNOSIS — Z79899 Other long term (current) drug therapy: Secondary | ICD-10-CM | POA: Diagnosis not present

## 2019-03-01 DIAGNOSIS — R972 Elevated prostate specific antigen [PSA]: Secondary | ICD-10-CM | POA: Diagnosis not present

## 2019-03-07 ENCOUNTER — Telehealth: Payer: Self-pay | Admitting: Neurology

## 2019-03-07 NOTE — Telephone Encounter (Signed)
Kindly advised the patient to reduce the Lyrica to 1 tablet twice daily for 4 days and then 1 tablet daily and call Dr. Jannifer Franklin on Monday for further advice

## 2019-03-07 NOTE — Telephone Encounter (Signed)
I called pt back about issues with pregablin. He is taking one tablet in the am and two in the evening. Pt has taken cymbalta , gabapentin in the past which did not help. He is reported some mild side effects that have increase. He is off balance sometimes, sinus issues, hands and and feet are swollen, some mild cognitive issues, thought process is diminish at times. He does not think the pregablin is helping. He states its for his hot feet and he is not having the numbness as much when he saw DR Jannifer Franklin. Pt is schedule to have MRI  Lumbar spine, and skin biopsy in  January 2021 with Dr.Yan. He wanted to know should he stop the lyrica or be taper off of it. I advise that Dr.WIllis is out of office till Monday but it will be sent to work in [place]. Pt verbalized understanding.

## 2019-03-07 NOTE — Telephone Encounter (Signed)
Pt is requesting a call back to discuss medications, pt wouldn't go into detail what he needed he states he will discuss once he receives the call back

## 2019-03-08 NOTE — Telephone Encounter (Signed)
I called patient about work in Dr Leonie Man on his recommendations. I stated he wants him to reduce lyrica to one tablet twice a day for 4 days, than one tablet daily. I stated a message will be sent to DR [facility] about further recommendations when he returns to office on Monday. Pt verbalized understanding.I stated after his MRI please allow 24 to 36 hours for results. Pt verbalized understanding of the recommendations.

## 2019-03-10 ENCOUNTER — Other Ambulatory Visit: Payer: Self-pay

## 2019-03-10 ENCOUNTER — Ambulatory Visit
Admission: RE | Admit: 2019-03-10 | Discharge: 2019-03-10 | Disposition: A | Discharge status: Home / Self Care | Payer: Medicare HMO | Source: Ambulatory Visit | Attending: Neurology | Admitting: Neurology

## 2019-03-10 DIAGNOSIS — R202 Paresthesia of skin: Secondary | ICD-10-CM | POA: Diagnosis not present

## 2019-03-11 DIAGNOSIS — E291 Testicular hypofunction: Secondary | ICD-10-CM | POA: Diagnosis not present

## 2019-03-11 DIAGNOSIS — Z8042 Family history of malignant neoplasm of prostate: Secondary | ICD-10-CM | POA: Diagnosis not present

## 2019-03-11 DIAGNOSIS — N529 Male erectile dysfunction, unspecified: Secondary | ICD-10-CM | POA: Diagnosis not present

## 2019-03-11 DIAGNOSIS — R972 Elevated prostate specific antigen [PSA]: Secondary | ICD-10-CM | POA: Diagnosis not present

## 2019-03-14 ENCOUNTER — Telehealth: Payer: Self-pay | Admitting: Neurology

## 2019-03-14 NOTE — Telephone Encounter (Signed)
I called the patient.  I went over the results of the MRI study with him.  He has had some issues tolerating higher doses of Lyrica, he is now cut back to 50 mg daily.  In the colder months, his burning feet seem to be better.  In the future if he needs more medication, we can try carbamazepine or Keppra.  The skin biopsy is to be done in January 2021.   MRI lumbar 03/10/19:  IMPRESSION: Abnormal MRI scan lumbar spine without contrast showing mild disc degenerative and moderate facet degenerative changes most prominent at L3-4 and L4-5 where there is moderate bilateral foraminal narrowing but no different compression.

## 2019-04-06 ENCOUNTER — Telehealth: Payer: Self-pay | Admitting: Neurology

## 2019-04-06 NOTE — Telephone Encounter (Signed)
I was able to speak to the patient.  He is coming for a skin biopsy on 04/08/2019.  He just wanted a general idea of what to expect at the appt.  I reviewed the procedure with him and he had no other questions.

## 2019-04-06 NOTE — Telephone Encounter (Signed)
Left message requesting a return call.

## 2019-04-06 NOTE — Telephone Encounter (Signed)
Patient would like a call back to discuss his appt on 04/14/2019  Cb# [phone]

## 2019-04-14 ENCOUNTER — Other Ambulatory Visit: Payer: Self-pay

## 2019-04-14 ENCOUNTER — Ambulatory Visit: Payer: Medicare HMO | Admitting: Neurology

## 2019-04-14 VITALS — BP 118/76 | HR 60 | Temp 97.5°F | Ht 68.0 in | Wt 196.0 lb

## 2019-04-14 DIAGNOSIS — G603 Idiopathic progressive neuropathy: Secondary | ICD-10-CM | POA: Diagnosis not present

## 2019-04-14 DIAGNOSIS — R202 Paresthesia of skin: Secondary | ICD-10-CM

## 2019-04-14 HISTORY — DX: Paresthesia of skin: R20.2

## 2019-04-14 NOTE — Progress Notes (Signed)
Patient was in left lateral recombinant position. Sterile technique. 1% lidocaine with epinephrine was used for local anesthesia. Punctuated skin biopsy was performed. 3 mm skin sample were obtained at at right foot, above right extensor digitorum brevis, and right lateral calf, 10 cm above lateral malleolus, lateral thigh, 20 cm below superior iliac spine.  Patient tolerated the procedure well.  The wound was covered with neosporin antibiotic cream and bandage.

## 2019-04-26 ENCOUNTER — Telehealth: Payer: Self-pay | Admitting: *Deleted

## 2019-04-26 NOTE — Telephone Encounter (Signed)
I called the patient.  The skin biopsy confirms the presence of a small fiber neuropathy, interestingly the reduced nerve fiber density was more prominent proximally than distally.  There is no mention of amyloidosis, not completely sure that they checked for this.

## 2019-04-26 NOTE — Telephone Encounter (Signed)
Received skin biopsy results from [facility].  Diagnosis from report:  A) Rt thigh: skin w/ significantly reduced Epidermal Nerve Fiber Density, consistent w/ small fiber neuropathy  B) Rt Calf: skin w/ significantly reduced Epidermal Nerve Fiber Density, consistent w/ small fiber neuropathy  C) Rt foot: skin w/ low normal Epidermal Nerve Fiber Density

## 2019-04-28 NOTE — Telephone Encounter (Signed)
Patient called back to discuss his biopsy results. Please follow up

## 2019-04-28 NOTE — Telephone Encounter (Signed)
I called the patient.  The skin biopsy confirmed a small fiber neuropathy.  He is not able to tolerate under 30 mg daily of the Lyrica, he will try going back to 50 mg twice daily.  He will try the capsaicin ointment, EMLA cream did not help.  If the above medications do not help, we may try adding Keppra to the regimen, he has been on Cymbalta previously but this resulted in impotence and is does not wish to go back on the medication.

## 2019-05-09 NOTE — Telephone Encounter (Signed)
I called the patient.  The patient is getting a good benefit so far with capsaicin ointment for his small fiber neuropathy, he has only been on the medication for 1 week, he should continue to improve with this, if the pain level significantly dropped, he may be able to come off of his Lyrica.

## 2019-05-09 NOTE — Telephone Encounter (Signed)
Phone rep checked office voicemails , pt left voicemail asking that Dr Melvenia Needles RN ) calls him back to continue the conversation [id number] ointment, please call this voicemail was left @10 :56a.m.

## 2019-05-09 NOTE — Telephone Encounter (Signed)
Per patient calls, he only wants to speak with Dr. Jannifer Franklin.Per last phone notes Dr.WIllis has spoken to patient about his concerns.

## 2019-05-10 DIAGNOSIS — E291 Testicular hypofunction: Secondary | ICD-10-CM | POA: Diagnosis not present

## 2019-05-10 DIAGNOSIS — E039 Hypothyroidism, unspecified: Secondary | ICD-10-CM | POA: Diagnosis not present

## 2019-05-10 DIAGNOSIS — N529 Male erectile dysfunction, unspecified: Secondary | ICD-10-CM | POA: Diagnosis not present

## 2019-05-10 DIAGNOSIS — Z125 Encounter for screening for malignant neoplasm of prostate: Secondary | ICD-10-CM | POA: Diagnosis not present

## 2019-05-10 DIAGNOSIS — R899 Unspecified abnormal finding in specimens from other organs, systems and tissues: Secondary | ICD-10-CM | POA: Diagnosis not present

## 2019-05-10 DIAGNOSIS — Z79899 Other long term (current) drug therapy: Secondary | ICD-10-CM | POA: Diagnosis not present

## 2019-05-10 DIAGNOSIS — R972 Elevated prostate specific antigen [PSA]: Secondary | ICD-10-CM | POA: Diagnosis not present

## 2019-05-10 DIAGNOSIS — G479 Sleep disorder, unspecified: Secondary | ICD-10-CM | POA: Diagnosis not present

## 2019-05-11 DIAGNOSIS — K5190 Ulcerative colitis, unspecified, without complications: Secondary | ICD-10-CM | POA: Diagnosis not present

## 2019-05-11 DIAGNOSIS — Z932 Ileostomy status: Secondary | ICD-10-CM | POA: Diagnosis not present

## 2019-05-11 DIAGNOSIS — E039 Hypothyroidism, unspecified: Secondary | ICD-10-CM | POA: Diagnosis not present

## 2019-05-11 DIAGNOSIS — C61 Malignant neoplasm of prostate: Secondary | ICD-10-CM | POA: Diagnosis not present

## 2019-05-16 DIAGNOSIS — M79644 Pain in right finger(s): Secondary | ICD-10-CM | POA: Diagnosis not present

## 2019-05-23 ENCOUNTER — Telehealth: Payer: Self-pay | Admitting: Neurology

## 2019-05-23 NOTE — Telephone Encounter (Signed)
I spoke to the patient. He is requesting a return call from Dr. Jannifer Franklin to discuss his treatment for his small fiber neuropathy.

## 2019-05-23 NOTE — Telephone Encounter (Signed)
I called the patient.  The patient is having some ankle swelling on the Lyrica, he believes that the capsaicin is helpful.  He may try going down to 50 mg of Lyrica daily for 2 weeks and then stop the drug if he is tolerating the taper.

## 2019-05-23 NOTE — Telephone Encounter (Signed)
Patient LVM stating he would like to speak to RN or MD in regards to his last OV.

## 2019-06-01 DIAGNOSIS — N400 Enlarged prostate without lower urinary tract symptoms: Secondary | ICD-10-CM | POA: Diagnosis not present

## 2019-06-01 DIAGNOSIS — N4283 Cyst of prostate: Secondary | ICD-10-CM | POA: Diagnosis not present

## 2019-06-01 DIAGNOSIS — R972 Elevated prostate specific antigen [PSA]: Secondary | ICD-10-CM | POA: Diagnosis not present

## 2019-06-07 DIAGNOSIS — G4733 Obstructive sleep apnea (adult) (pediatric): Secondary | ICD-10-CM | POA: Diagnosis not present

## 2019-06-20 ENCOUNTER — Ambulatory Visit: Payer: Medicare HMO | Admitting: Neurology

## 2019-07-28 ENCOUNTER — Telehealth: Payer: Self-pay | Admitting: Neurology

## 2019-07-28 NOTE — Telephone Encounter (Signed)
Pt has called asking for a call from Dr Jannifer Franklin to continue their conversation [id number].  Pt is asking for a call from Dr Jannifer Franklin on his return next week

## 2019-07-29 NOTE — Addendum Note (Signed)
Addended by: Kathrynn Ducking on: 07/29/2019 01:13 PM   Modules accepted: Orders

## 2019-07-29 NOTE — Telephone Encounter (Signed)
I called the patient.  He is doing quite well on the capsaicin, he will continue this, he claims that he is currently off of Lyrica.  He can use this indefinitely.

## 2019-08-08 DIAGNOSIS — M25562 Pain in left knee: Secondary | ICD-10-CM | POA: Diagnosis not present

## 2019-08-08 DIAGNOSIS — M25561 Pain in right knee: Secondary | ICD-10-CM | POA: Diagnosis not present

## 2019-08-10 DIAGNOSIS — R69 Illness, unspecified: Secondary | ICD-10-CM | POA: Diagnosis not present

## 2019-08-11 DIAGNOSIS — I7381 Erythromelalgia: Secondary | ICD-10-CM | POA: Diagnosis not present

## 2019-08-11 DIAGNOSIS — G629 Polyneuropathy, unspecified: Secondary | ICD-10-CM | POA: Diagnosis not present

## 2019-08-16 DIAGNOSIS — E039 Hypothyroidism, unspecified: Secondary | ICD-10-CM | POA: Diagnosis not present

## 2019-08-16 DIAGNOSIS — Z932 Ileostomy status: Secondary | ICD-10-CM | POA: Diagnosis not present

## 2019-08-16 DIAGNOSIS — C61 Malignant neoplasm of prostate: Secondary | ICD-10-CM | POA: Diagnosis not present

## 2019-08-16 DIAGNOSIS — K5190 Ulcerative colitis, unspecified, without complications: Secondary | ICD-10-CM | POA: Diagnosis not present

## 2019-08-19 ENCOUNTER — Telehealth: Payer: Self-pay | Admitting: Neurology

## 2019-08-19 NOTE — Telephone Encounter (Signed)
I called the patient.  The patient has noted some sensory alteration on I believe his right knee.  It is on the lateral aspect, just below the knee, a small area of altered sensation.  He has seen his orthopedic doctor, they see nothing wrong with the knee itself.  There is no pain involved, no weakness, no altered ability to perform physical activity.  I think for now, I would follow this conservatively, the patient has just recently had MRI of the lumbar spine without any definite evidence of nerve root impingement, he does not report any radicular pain or sensory alteration.  There is no weakness.  He will call me if there is any change in his underlying condition.  This has been present for several months.

## 2019-08-19 NOTE — Telephone Encounter (Signed)
Pt called stating he has been correstponding with provider and he is needing to discuss more on his medication. Please advise.

## 2019-08-23 DIAGNOSIS — R944 Abnormal results of kidney function studies: Secondary | ICD-10-CM | POA: Diagnosis not present

## 2019-08-23 DIAGNOSIS — Z79899 Other long term (current) drug therapy: Secondary | ICD-10-CM | POA: Diagnosis not present

## 2019-08-23 DIAGNOSIS — I7381 Erythromelalgia: Secondary | ICD-10-CM | POA: Diagnosis not present

## 2019-08-23 DIAGNOSIS — E291 Testicular hypofunction: Secondary | ICD-10-CM | POA: Diagnosis not present

## 2019-08-23 DIAGNOSIS — R972 Elevated prostate specific antigen [PSA]: Secondary | ICD-10-CM | POA: Diagnosis not present

## 2019-09-01 DIAGNOSIS — R69 Illness, unspecified: Secondary | ICD-10-CM | POA: Diagnosis not present

## 2019-09-01 DIAGNOSIS — G479 Sleep disorder, unspecified: Secondary | ICD-10-CM | POA: Diagnosis not present

## 2019-09-01 DIAGNOSIS — N529 Male erectile dysfunction, unspecified: Secondary | ICD-10-CM | POA: Diagnosis not present

## 2019-09-02 DIAGNOSIS — Z8042 Family history of malignant neoplasm of prostate: Secondary | ICD-10-CM | POA: Diagnosis not present

## 2019-09-02 DIAGNOSIS — R972 Elevated prostate specific antigen [PSA]: Secondary | ICD-10-CM | POA: Diagnosis not present

## 2019-09-02 DIAGNOSIS — E291 Testicular hypofunction: Secondary | ICD-10-CM | POA: Diagnosis not present

## 2019-09-21 DIAGNOSIS — G4733 Obstructive sleep apnea (adult) (pediatric): Secondary | ICD-10-CM | POA: Diagnosis not present

## 2019-10-07 DIAGNOSIS — R634 Abnormal weight loss: Secondary | ICD-10-CM | POA: Diagnosis not present

## 2019-10-12 DIAGNOSIS — R972 Elevated prostate specific antigen [PSA]: Secondary | ICD-10-CM | POA: Diagnosis not present

## 2019-11-10 DIAGNOSIS — K5190 Ulcerative colitis, unspecified, without complications: Secondary | ICD-10-CM | POA: Diagnosis not present

## 2019-11-10 DIAGNOSIS — E039 Hypothyroidism, unspecified: Secondary | ICD-10-CM | POA: Diagnosis not present

## 2019-11-10 DIAGNOSIS — Z932 Ileostomy status: Secondary | ICD-10-CM | POA: Diagnosis not present

## 2019-11-10 DIAGNOSIS — C61 Malignant neoplasm of prostate: Secondary | ICD-10-CM | POA: Diagnosis not present

## 2019-11-29 DIAGNOSIS — R972 Elevated prostate specific antigen [PSA]: Secondary | ICD-10-CM | POA: Diagnosis not present

## 2019-11-29 DIAGNOSIS — Z8546 Personal history of malignant neoplasm of prostate: Secondary | ICD-10-CM | POA: Diagnosis not present

## 2019-11-29 DIAGNOSIS — Z79899 Other long term (current) drug therapy: Secondary | ICD-10-CM | POA: Diagnosis not present

## 2019-11-29 DIAGNOSIS — R9721 Rising PSA following treatment for malignant neoplasm of prostate: Secondary | ICD-10-CM | POA: Diagnosis not present

## 2019-12-07 ENCOUNTER — Telehealth: Payer: Self-pay | Admitting: Neurology

## 2019-12-07 NOTE — Telephone Encounter (Signed)
I tried to call the patient, left message, I will call back later.

## 2019-12-07 NOTE — Telephone Encounter (Signed)
I called the patient.  The patient is using the capsaicin twice a day and is starting to have some recurrence of symptoms, he may need to use the medication at least 3 times a day.

## 2019-12-19 DIAGNOSIS — Z85528 Personal history of other malignant neoplasm of kidney: Secondary | ICD-10-CM | POA: Diagnosis not present

## 2019-12-19 DIAGNOSIS — R972 Elevated prostate specific antigen [PSA]: Secondary | ICD-10-CM | POA: Diagnosis not present

## 2019-12-26 DIAGNOSIS — R972 Elevated prostate specific antigen [PSA]: Secondary | ICD-10-CM | POA: Diagnosis not present

## 2019-12-27 DIAGNOSIS — M8589 Other specified disorders of bone density and structure, multiple sites: Secondary | ICD-10-CM | POA: Diagnosis not present

## 2020-02-07 DIAGNOSIS — H5203 Hypermetropia, bilateral: Secondary | ICD-10-CM | POA: Diagnosis not present

## 2020-02-07 DIAGNOSIS — Z0100 Encounter for examination of eyes and vision without abnormal findings: Secondary | ICD-10-CM | POA: Diagnosis not present

## 2020-02-14 DIAGNOSIS — E782 Mixed hyperlipidemia: Secondary | ICD-10-CM | POA: Diagnosis not present

## 2020-02-14 DIAGNOSIS — M8580 Other specified disorders of bone density and structure, unspecified site: Secondary | ICD-10-CM | POA: Diagnosis not present

## 2020-02-14 DIAGNOSIS — I10 Essential (primary) hypertension: Secondary | ICD-10-CM | POA: Diagnosis not present

## 2020-02-14 DIAGNOSIS — E039 Hypothyroidism, unspecified: Secondary | ICD-10-CM | POA: Diagnosis not present

## 2020-02-14 DIAGNOSIS — Z87442 Personal history of urinary calculi: Secondary | ICD-10-CM | POA: Diagnosis not present

## 2020-02-14 DIAGNOSIS — Z0000 Encounter for general adult medical examination without abnormal findings: Secondary | ICD-10-CM | POA: Diagnosis not present

## 2020-02-14 DIAGNOSIS — G479 Sleep disorder, unspecified: Secondary | ICD-10-CM | POA: Diagnosis not present

## 2020-02-14 DIAGNOSIS — K219 Gastro-esophageal reflux disease without esophagitis: Secondary | ICD-10-CM | POA: Diagnosis not present

## 2020-02-14 DIAGNOSIS — Z932 Ileostomy status: Secondary | ICD-10-CM | POA: Diagnosis not present

## 2020-02-14 DIAGNOSIS — N529 Male erectile dysfunction, unspecified: Secondary | ICD-10-CM | POA: Diagnosis not present

## 2020-02-15 DIAGNOSIS — R69 Illness, unspecified: Secondary | ICD-10-CM | POA: Diagnosis not present

## 2020-02-22 DIAGNOSIS — E782 Mixed hyperlipidemia: Secondary | ICD-10-CM | POA: Diagnosis not present

## 2020-02-22 DIAGNOSIS — M8580 Other specified disorders of bone density and structure, unspecified site: Secondary | ICD-10-CM | POA: Diagnosis not present

## 2020-02-22 DIAGNOSIS — E039 Hypothyroidism, unspecified: Secondary | ICD-10-CM | POA: Diagnosis not present

## 2020-02-22 DIAGNOSIS — R69 Illness, unspecified: Secondary | ICD-10-CM | POA: Diagnosis not present

## 2020-02-22 DIAGNOSIS — M179 Osteoarthritis of knee, unspecified: Secondary | ICD-10-CM | POA: Diagnosis not present

## 2020-02-22 DIAGNOSIS — K219 Gastro-esophageal reflux disease without esophagitis: Secondary | ICD-10-CM | POA: Diagnosis not present

## 2020-02-22 DIAGNOSIS — I10 Essential (primary) hypertension: Secondary | ICD-10-CM | POA: Diagnosis not present

## 2020-03-13 DIAGNOSIS — Z23 Encounter for immunization: Secondary | ICD-10-CM | POA: Diagnosis not present

## 2020-03-14 DIAGNOSIS — R972 Elevated prostate specific antigen [PSA]: Secondary | ICD-10-CM | POA: Diagnosis not present

## 2020-04-12 DIAGNOSIS — Z20822 Contact with and (suspected) exposure to covid-19: Secondary | ICD-10-CM | POA: Diagnosis not present

## 2020-04-16 DIAGNOSIS — G4733 Obstructive sleep apnea (adult) (pediatric): Secondary | ICD-10-CM | POA: Diagnosis not present

## 2020-05-15 DIAGNOSIS — Z932 Ileostomy status: Secondary | ICD-10-CM | POA: Diagnosis not present

## 2020-05-15 DIAGNOSIS — E039 Hypothyroidism, unspecified: Secondary | ICD-10-CM | POA: Diagnosis not present

## 2020-05-15 DIAGNOSIS — C61 Malignant neoplasm of prostate: Secondary | ICD-10-CM | POA: Diagnosis not present

## 2020-05-15 DIAGNOSIS — K5190 Ulcerative colitis, unspecified, without complications: Secondary | ICD-10-CM | POA: Diagnosis not present

## 2020-05-17 ENCOUNTER — Emergency Department (HOSPITAL_BASED_OUTPATIENT_CLINIC_OR_DEPARTMENT_OTHER)
Admission: EM | Admit: 2020-05-17 | Discharge: 2020-05-17 | Disposition: A | Discharge status: Home / Self Care | Payer: Medicare HMO | Attending: Emergency Medicine | Admitting: Emergency Medicine

## 2020-05-17 ENCOUNTER — Emergency Department (HOSPITAL_BASED_OUTPATIENT_CLINIC_OR_DEPARTMENT_OTHER): Payer: Medicare HMO

## 2020-05-17 ENCOUNTER — Encounter (HOSPITAL_BASED_OUTPATIENT_CLINIC_OR_DEPARTMENT_OTHER): Payer: Self-pay | Admitting: Emergency Medicine

## 2020-05-17 ENCOUNTER — Other Ambulatory Visit: Payer: Self-pay

## 2020-05-17 DIAGNOSIS — Z7982 Long term (current) use of aspirin: Secondary | ICD-10-CM | POA: Insufficient documentation

## 2020-05-17 DIAGNOSIS — R197 Diarrhea, unspecified: Secondary | ICD-10-CM | POA: Diagnosis not present

## 2020-05-17 DIAGNOSIS — Z9049 Acquired absence of other specified parts of digestive tract: Secondary | ICD-10-CM | POA: Diagnosis not present

## 2020-05-17 DIAGNOSIS — N281 Cyst of kidney, acquired: Secondary | ICD-10-CM | POA: Diagnosis not present

## 2020-05-17 DIAGNOSIS — J45909 Unspecified asthma, uncomplicated: Secondary | ICD-10-CM | POA: Insufficient documentation

## 2020-05-17 DIAGNOSIS — I10 Essential (primary) hypertension: Secondary | ICD-10-CM | POA: Diagnosis not present

## 2020-05-17 DIAGNOSIS — N179 Acute kidney failure, unspecified: Secondary | ICD-10-CM | POA: Diagnosis not present

## 2020-05-17 DIAGNOSIS — K760 Fatty (change of) liver, not elsewhere classified: Secondary | ICD-10-CM | POA: Diagnosis not present

## 2020-05-17 DIAGNOSIS — N189 Chronic kidney disease, unspecified: Secondary | ICD-10-CM | POA: Diagnosis not present

## 2020-05-17 DIAGNOSIS — Z96653 Presence of artificial knee joint, bilateral: Secondary | ICD-10-CM | POA: Insufficient documentation

## 2020-05-17 DIAGNOSIS — Z85528 Personal history of other malignant neoplasm of kidney: Secondary | ICD-10-CM | POA: Insufficient documentation

## 2020-05-17 DIAGNOSIS — Z932 Ileostomy status: Secondary | ICD-10-CM | POA: Diagnosis not present

## 2020-05-17 DIAGNOSIS — R109 Unspecified abdominal pain: Secondary | ICD-10-CM | POA: Diagnosis present

## 2020-05-17 DIAGNOSIS — G4733 Obstructive sleep apnea (adult) (pediatric): Secondary | ICD-10-CM | POA: Diagnosis not present

## 2020-05-17 DIAGNOSIS — Z79899 Other long term (current) drug therapy: Secondary | ICD-10-CM | POA: Diagnosis not present

## 2020-05-17 DIAGNOSIS — E860 Dehydration: Secondary | ICD-10-CM

## 2020-05-17 DIAGNOSIS — C61 Malignant neoplasm of prostate: Secondary | ICD-10-CM | POA: Diagnosis not present

## 2020-05-17 DIAGNOSIS — K5190 Ulcerative colitis, unspecified, without complications: Secondary | ICD-10-CM | POA: Diagnosis not present

## 2020-05-17 DIAGNOSIS — E039 Hypothyroidism, unspecified: Secondary | ICD-10-CM | POA: Diagnosis not present

## 2020-05-17 LAB — COMPREHENSIVE METABOLIC PANEL
ALT: 47 U/L — ABNORMAL HIGH (ref 0–44)
AST: 48 U/L — ABNORMAL HIGH (ref 15–41)
Albumin: 5.1 g/dL — ABNORMAL HIGH (ref 3.5–5.0)
Alkaline Phosphatase: 78 U/L (ref 38–126)
Anion gap: 16 — ABNORMAL HIGH (ref 5–15)
BUN: 28 mg/dL — ABNORMAL HIGH (ref 8–23)
CO2: 16 mmol/L — ABNORMAL LOW (ref 22–32)
Calcium: 11 mg/dL — ABNORMAL HIGH (ref 8.9–10.3)
Chloride: 101 mmol/L (ref 98–111)
Creatinine, Ser: 2.42 mg/dL — ABNORMAL HIGH (ref 0.61–1.24)
GFR, Estimated: 28 mL/min — ABNORMAL LOW (ref >60)
Glucose, Bld: 200 mg/dL — ABNORMAL HIGH (ref 70–99)
Potassium: 4.9 mmol/L (ref 3.5–5.1)
Sodium: 133 mmol/L — ABNORMAL LOW (ref 135–145)
Total Bilirubin: 1.4 mg/dL — ABNORMAL HIGH (ref 0.3–1.2)
Total Protein: 9.4 g/dL — ABNORMAL HIGH (ref 6.5–8.1)

## 2020-05-17 LAB — URINALYSIS, ROUTINE W REFLEX MICROSCOPIC
Bilirubin Urine: NEGATIVE
Glucose, UA: NEGATIVE mg/dL
Hgb urine dipstick: NEGATIVE
Ketones, ur: NEGATIVE mg/dL
Leukocytes,Ua: NEGATIVE
Nitrite: NEGATIVE
Protein, ur: NEGATIVE mg/dL
Specific Gravity, Urine: 1.03 (ref 1.005–1.030)
pH: 5 (ref 5.0–8.0)

## 2020-05-17 LAB — LIPASE, BLOOD: Lipase: 56 U/L — ABNORMAL HIGH (ref 11–51)

## 2020-05-17 LAB — CBC
HCT: 49.5 % (ref 39.0–52.0)
Hemoglobin: 17.4 g/dL — ABNORMAL HIGH (ref 13.0–17.0)
MCH: 31.1 pg (ref 26.0–34.0)
MCHC: 35.2 g/dL (ref 30.0–36.0)
MCV: 88.4 fL (ref 80.0–100.0)
Platelets: 330 10*3/uL (ref 150–400)
RBC: 5.6 MIL/uL (ref 4.22–5.81)
RDW: 12.6 % (ref 11.5–15.5)
WBC: 13.3 10*3/uL — ABNORMAL HIGH (ref 4.0–10.5)
nRBC: 0 % (ref 0.0–0.2)

## 2020-05-17 LAB — BASIC METABOLIC PANEL
Anion gap: 9 (ref 5–15)
BUN: 26 mg/dL — ABNORMAL HIGH (ref 8–23)
CO2: 19 mmol/L — ABNORMAL LOW (ref 22–32)
Calcium: 8.6 mg/dL — ABNORMAL LOW (ref 8.9–10.3)
Chloride: 108 mmol/L (ref 98–111)
Creatinine, Ser: 1.76 mg/dL — ABNORMAL HIGH (ref 0.61–1.24)
GFR, Estimated: 42 mL/min — ABNORMAL LOW (ref >60)
Glucose, Bld: 97 mg/dL (ref 70–99)
Potassium: 3.9 mmol/L (ref 3.5–5.1)
Sodium: 136 mmol/L (ref 135–145)

## 2020-05-17 MED ORDER — LACTATED RINGERS IV BOLUS
1000.0000 mL | Freq: Once | INTRAVENOUS | Status: AC
  Administered 2020-05-17: 1000 mL via INTRAVENOUS

## 2020-05-17 MED ORDER — ONDANSETRON HCL 4 MG/2ML IJ SOLN
4.0000 mg | Freq: Once | INTRAMUSCULAR | Status: AC
  Administered 2020-05-17: 4 mg via INTRAVENOUS
  Filled 2020-05-17: qty 2

## 2020-05-17 MED ORDER — SODIUM CHLORIDE 0.9 % IV SOLN: INTRAVENOUS | Status: DC

## 2020-05-17 MED ORDER — SODIUM CHLORIDE 0.9 % IV BOLUS
2000.0000 mL | Freq: Once | INTRAVENOUS | Status: AC
  Administered 2020-05-17: 2000 mL via INTRAVENOUS

## 2020-05-17 NOTE — ED Notes (Signed)
Pt resting comfortably; states he wants to have his next bag of fluids as bolus, rather than slower infusion. States he has had similar incidents in the past and has had up to 6L as bolus before his "kidneys wake up."

## 2020-05-17 NOTE — Discharge Instructions (Addendum)
Follow-up with your doctor for to have your electrolytes rechecked.  Also go forward with the arrangements from your primary care doctor to continue IV fluids at home.  Does have done in the past.  Return for any new or worse symptoms

## 2020-05-17 NOTE — ED Notes (Signed)
Pt came out of restroom and stated that his kidneys seem to be working now, as he was able to urinate a large amount.

## 2020-05-17 NOTE — ED Provider Notes (Signed)
Care transferred to me.  Patient is requesting more IV fluids.  Per Dr. Gloris Manchester plan, will give 1 more liter and at the same time I will be rechecking a BMP.  Patient is feeling better after the fifth liter.  He states he is now urinating much more strongly and this is a typical indication that he is better hydrated.  Electrolytes seem to be better including his hypercalcemia and his bicarbonate is also up.  His creatinine is improving and last creatinine I can find in care everywhere is 1.5 from February 2021.  Thus nearly back to his baseline.  Follow-up with his PCP early next week as already arranged.   Sherwood Gambler, MD 05/17/20 [phone]

## 2020-05-17 NOTE — ED Triage Notes (Signed)
Reports he feels like he got food poisoning from some spaghetti he ate.  Reports nausea and diarrhea since 3 pm yesterday.  Hx of ileostomy.

## 2020-05-17 NOTE — ED Provider Notes (Signed)
[facility] EMERGENCY DEPARTMENT Provider Note   CSN: [id number] Arrival date & time: 05/17/20  4132     History Chief Complaint  Patient presents with  . Abdominal Cramping  . Diarrhea    David [name] is a 70 y.o. male.  Patient with acute onset at of nausea and diarrhea since 3 PM yesterday.  Patient had a history of ulcerative colitis has had his colon removed he is got a ileostomy.  Is been nothing but pure water that is been coming out of it.  This has occurred to him in the past.  But not recently that we have seen him.  Last time we admitted him for this was in 2014.  But since that time patient has been managed kind of with IV fluids at home through home nurse and to his primary care David [name].  Patient is concerned that maybe he got some food poisoning from spaghetti.  There is no blood in and the diarrhea has resolved.  Has had some abdominal cramping but no severe abdominal pain.  States that he usually needs lots of fluids when he starts to pee then he is good to go home.  Patient has been taking Imodium at home without any help.        Past Medical History:  Diagnosis Date  . Anxiety   . FH: kidney cancer   . GERD (gastroesophageal reflux disease)   . Graves disease   . History of kidney stones   . History of renal cell carcinoma    s/p  left partial nephrectomy  . History of small bowel obstruction    recurrent  . History of ulcerative colitis    s/p total colectomy 1981  . Hypertension   . Hypothyroidism   . 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 OA (osteoarthritis) of knee    both  . OSA on CPAP   . Personal history of asthma    childhood  . Right nephrolithiasis    non-obstuctive per ct 03-14-2015  . Right ureteral stone   . S/P ileostomy ([place])   . UC (ulcerative colitis) [facility])     Patient Active Problem List   Diagnosis Date Noted  . Paresthesia 04/14/2019  . Primary localized osteoarthritis of right knee   .  Obstructive sleep apnea 01/01/2015  . 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 DJD (degenerative joint disease) of knee 01/01/2015  . Cancer of left kidney ([place]) 12/20/2014  . Primary localized osteoarthritis of left knee 12/20/2014  . Gastroenteritis, acute 10/29/2012  . Dehydration 10/29/2012  . Abdominal pain, other specified site 10/28/2012  . Diarrhea 10/28/2012  . Ileostomy present ([place]) 10/28/2012  . Leukocytosis, unspecified 10/28/2012  . Acute renal failure ([place]) 10/28/2012  . Hypercalcemia 10/28/2012    Past Surgical History:  Procedure Laterality Date  . APPENDECTOMY  1981  . CYSTO/  RIGHT RETROGRADE PYELOGRAM/  RIGHT STENT PLACEMENT  09-23-2006  . KNEE ARTHROSCOPY Left 01/01/2015   Procedure: ARTHROSCOPY KNEE;  Surgeon: Elsie Saas, MD;  Location: [facility];  Service: Orthopedics;  Laterality: Left;  . PARTIAL NEPHRECTOMY Left 2003  . TONSILLECTOMY  1990's  . TOTAL KNEE ARTHROPLASTY Left 01/01/2015   Procedure: TOTAL KNEE ARTHROPLASTY;  Surgeon: Elsie Saas, MD;  Location: [facility];  Service: Orthopedics;  Laterality: Left;  . TOTAL KNEE ARTHROPLASTY Right 02/12/2015   Procedure: TOTAL RIGHT KNEE ARTHROPLASTY;  Surgeon: Elsie Saas, MD;  Location: [facility];  Service: Orthopedics;  Laterality: Right;  .  TOTAL PROCTOCOLECTOMY W/ END ILEOSTOMY  1981       Family History  Problem Relation Age of Onset  . Cancer Father        thyroid  . Prostate cancer Brother     Social History   Tobacco Use  . Smoking status: Never Smoker  . Smokeless tobacco: Never Used  Substance Use Topics  . Alcohol use: No  . Drug use: No    Home Medications Prior to Admission medications   Medication Sig Start Date End Date Taking? Authorizing Provider  aspirin EC 81 MG tablet Take 81 mg by mouth daily.    [provider]  levothyroxine (SYNTHROID) 112 MCG tablet Take 112 mcg by mouth daily. 11/06/18   [provider]  Lidocaine 4 % GEL Apply 1 g  topically 2 (two) times daily as needed. 02/15/19   Kathrynn Ducking, MD  lisinopril (ZESTRIL) 10 MG tablet Take 10 mg by mouth daily. 02/02/19   [provider]  mometasone (ELOCON) 0.1 % cream Apply 1 application topically daily.    [provider]  Multiple Vitamin (MULTIVITAMIN WITH MINERALS) TABS Take 1 tablet by mouth daily.    [provider]  tamsulosin (FLOMAX) 0.4 MG CAPS capsule Take 0.4 mg by mouth as needed.     [provider]  traZODone (DESYREL) 150 MG tablet Take 75 mg by mouth at bedtime.    [provider]    Allergies    Cymbalta [duloxetine hcl]  Review of Systems   Review of Systems  Constitutional: Negative for chills and fever.  HENT: Negative for congestion, rhinorrhea and sore throat.   Eyes: Negative for visual disturbance.  Respiratory: Negative for cough and shortness of breath.   Cardiovascular: Negative for chest pain and leg swelling.  Gastrointestinal: Positive for abdominal pain, diarrhea and nausea. Negative for blood in stool and vomiting.  Genitourinary: Negative for dysuria.  Musculoskeletal: Negative for back pain and neck pain.  Skin: Negative for rash.  Neurological: Negative for dizziness, light-headedness and headaches.  Hematological: Does not bruise/bleed easily.  Psychiatric/Behavioral: Negative for confusion.    Physical Exam Updated Vital Signs BP 126/80   Pulse 70   Temp 98.1 F (36.7 C) (Oral)   Resp 18   Ht 1.727 m (5' 8" )   Wt 81.2 kg   SpO2 100%   BMI 27.22 kg/m   Physical Exam Vitals and nursing note reviewed.  Constitutional:      General: He is not in acute distress.    Appearance: Normal appearance. He is well-developed and well-nourished.  HENT:     Head: Normocephalic and atraumatic.  Eyes:     Conjunctiva/sclera: Conjunctivae normal.     Pupils: Pupils are equal, round, and reactive to light.  Cardiovascular:     Rate and Rhythm: Normal rate and regular rhythm.      Heart sounds: No murmur heard.   Pulmonary:     Effort: Pulmonary effort is normal. No respiratory distress.     Breath sounds: Normal breath sounds.  Abdominal:     General: There is no distension.     Palpations: Abdomen is soft.     Tenderness: There is no abdominal tenderness. There is no guarding.     Comments: Ileostomy bag right lower quadrant area.  No leakage.  Watery stool.  Musculoskeletal:        General: No edema. Normal range of motion.     Cervical back: Normal range of motion and  neck supple.  Skin:    General: Skin is warm and dry.     Capillary Refill: Capillary refill takes less than 2 seconds.  Neurological:     General: No focal deficit present.     Mental Status: He is alert and oriented to person, place, and time.     Sensory: No sensory deficit.  Psychiatric:        Mood and Affect: Mood and affect normal.     ED Results / Procedures / Treatments   Labs (all labs ordered are listed, but only abnormal results are displayed) Labs Reviewed  LIPASE, BLOOD - Abnormal; Notable for the following components:      Result Value   Lipase 56 (*)    All other components within normal limits  COMPREHENSIVE METABOLIC PANEL - Abnormal; Notable for the following components:   Sodium 133 (*)    CO2 16 (*)    Glucose, Bld 200 (*)    BUN 28 (*)    Creatinine, Ser 2.42 (*)    Calcium 11.0 (*)    Total Protein 9.4 (*)    Albumin 5.1 (*)    AST 48 (*)    ALT 47 (*)    Total Bilirubin 1.4 (*)    GFR, Estimated 28 (*)    Anion gap 16 (*)    All other components within normal limits  CBC - Abnormal; Notable for the following components:   WBC 13.3 (*)    Hemoglobin 17.4 (*)    All other components within normal limits  URINALYSIS, ROUTINE W REFLEX MICROSCOPIC  BASIC METABOLIC PANEL    EKG None  Radiology CT ABDOMEN PELVIS WO CONTRAST  Result Date: 05/17/2020 CLINICAL DATA:  Nausea, vomiting, bowel obstruction suspected, history of ulcerative colitis  status post colectomy with a ileostomy EXAM: CT ABDOMEN AND PELVIS WITHOUT CONTRAST TECHNIQUE: Multidetector CT imaging of the abdomen and pelvis was performed following the standard protocol without IV contrast. COMPARISON:  02/01/2019 FINDINGS: Lower chest: No acute abnormality. Hepatobiliary: Limited without IV contrast. Diffuse hepatic hypoattenuation compatible with known hepatic steatosis. No large focal hepatic abnormality or intrahepatic biliary dilatation. Gallbladder nondistended. Common bile duct nondilated. Pancreas: Unremarkable. No pancreatic ductal dilatation or surrounding inflammatory changes. Spleen: Normal in size without focal abnormality. Adrenals/Urinary Tract: No adrenal nodule or mass. Stable appearance. Chronic perinephric renal septal edema. No renal obstruction or hydronephrosis. Stable hyperdense 12 mm right renal pelvic cyst as demonstrated on the MRI. Ureters are symmetric and decompressed. No hydroureter or ureteral calculus. Bladder collapsed. Stomach/Bowel: Stomach and small bowel are nondistended. Patient has a right lower quadrant ileostomy. Patient is status post total colectomy. Negative for bowel obstruction. No free fluid, fluid collection, hemorrhage, hematoma, abscess or ascites. Vascular/Lymphatic: Limited without IV contrast. Normal caliber aorta. No significant atherosclerosis. No retroperitoneal hemorrhage or hematoma. No bulky adenopathy. Reproductive: No significant finding by CT. Other: No abdominal wall hernia or abnormality. No abdominopelvic ascites. Musculoskeletal: Degenerative changes of the spine. No acute osseous finding. IMPRESSION: No acute intra-abdominal or pelvic finding by noncontrast CT. Hepatic steatosis Remote total colectomy with a right lower quadrant ileostomy. Negative for obstruction. Electronically Signed   By: Jerilynn Mages.  Shick M.D.   On: 05/17/2020 10:15    Procedures Procedures   CRITICAL CARE Performed by: Fredia Sorrow Total critical care  time: 45 minutes Critical care time was exclusive of separately billable procedures and treating other patients. Critical care was necessary to treat or prevent imminent or life-threatening deterioration. Critical care was time  spent personally by me on the following activities: development of treatment plan with patient and/or surrogate as well as nursing, discussions with consultants, evaluation of patient's response to treatment, examination of patient, obtaining history from patient or surrogate, ordering and performing treatments and interventions, ordering and review of laboratory studies, ordering and review of radiographic studies, pulse oximetry and re-evaluation of patient's condition.   Medications Ordered in ED Medications  0.9 %  sodium chloride infusion (0 mLs Intravenous Hold 05/17/20 1140)  0.9 %  sodium chloride infusion ( Intravenous New Bag/Given 05/17/20 1355)  lactated ringers bolus 1,000 mL (has no administration in time range)  sodium chloride 0.9 % bolus 2,000 mL (0 mLs Intravenous Stopped 05/17/20 1140)  ondansetron (ZOFRAN) injection 4 mg (4 mg Intravenous Given 05/17/20 0935)  sodium chloride 0.9 % bolus 2,000 mL (0 mLs Intravenous Stopped 05/17/20 1354)    ED Course  I have reviewed the triage vital signs and the nursing notes.  Pertinent labs & imaging results that were available during my care of the patient were reviewed by me and considered in my medical decision making (see chart for details).    MDM Rules/Calculators/A&P                          Patient's work-up here abdomen without any acute findings CT scan of abdomen without any acute findings.  Lipase elevated a little bit at 56 and CO2 was 16.  Blood sugar 200.  Big change was BUN and creatinine which had a BUN of 28 and creatinine of 2.42.  Consistent with acute kidney injury.  Last creatinine we had was 1.5.  Patient stated that one was when he did not have all this diarrhea.  Liver function test with  some slight abnormalities but CT scan of the abdomen was normal.  So he does have an anion gap.  These were original prior to him receiving any fluids.  Patient was given 2 L of fluid.  He felt that things were improving but he still did not have to pee he felt that he needed 2 more.  Have given have given 3rd and 4th liter.  Patient thinks he needs 2 more but I agreed to give him 1/5.  Will check electrolytes.  If no signs of improvement at that point or if is any worsening of his BUN and creatinine then patient requires admission.  Patient may refuse because I stated that he needed admission before.  He did not want to do that.  States that he does not just gets fluids in the hospital and is unnecessary.  Tried to work with the patient to try to give him as much fluid here as possible.  Patient turned over to the evening physician Dr. Verner Chol.  He will get 1/5 L there can check electrolytes and they are going to reevaluate.    Final Clinical Impression(s) / ED Diagnoses Final diagnoses:  Diarrhea, unspecified type  AKI (acute kidney injury) ([place])  Dehydration    Rx / DC Orders ED Discharge Orders    None       Fredia Sorrow, MD 05/17/20 1542

## 2020-05-21 DIAGNOSIS — E860 Dehydration: Secondary | ICD-10-CM | POA: Diagnosis not present

## 2020-05-21 DIAGNOSIS — R899 Unspecified abnormal finding in specimens from other organs, systems and tissues: Secondary | ICD-10-CM | POA: Diagnosis not present

## 2020-05-21 DIAGNOSIS — R197 Diarrhea, unspecified: Secondary | ICD-10-CM | POA: Diagnosis not present

## 2020-05-21 DIAGNOSIS — Z932 Ileostomy status: Secondary | ICD-10-CM | POA: Diagnosis not present

## 2020-05-21 DIAGNOSIS — Z09 Encounter for follow-up examination after completed treatment for conditions other than malignant neoplasm: Secondary | ICD-10-CM | POA: Diagnosis not present

## 2020-05-21 DIAGNOSIS — K219 Gastro-esophageal reflux disease without esophagitis: Secondary | ICD-10-CM | POA: Diagnosis not present

## 2020-05-21 DIAGNOSIS — R944 Abnormal results of kidney function studies: Secondary | ICD-10-CM | POA: Diagnosis not present

## 2020-06-14 DIAGNOSIS — E039 Hypothyroidism, unspecified: Secondary | ICD-10-CM | POA: Diagnosis not present

## 2020-06-14 DIAGNOSIS — C61 Malignant neoplasm of prostate: Secondary | ICD-10-CM | POA: Diagnosis not present

## 2020-06-14 DIAGNOSIS — K5190 Ulcerative colitis, unspecified, without complications: Secondary | ICD-10-CM | POA: Diagnosis not present

## 2020-06-14 DIAGNOSIS — G4733 Obstructive sleep apnea (adult) (pediatric): Secondary | ICD-10-CM | POA: Diagnosis not present

## 2020-06-14 DIAGNOSIS — Z932 Ileostomy status: Secondary | ICD-10-CM | POA: Diagnosis not present

## 2020-06-21 DIAGNOSIS — R972 Elevated prostate specific antigen [PSA]: Secondary | ICD-10-CM | POA: Diagnosis not present

## 2020-06-21 DIAGNOSIS — Z85528 Personal history of other malignant neoplasm of kidney: Secondary | ICD-10-CM | POA: Diagnosis not present

## 2020-06-21 DIAGNOSIS — Z8042 Family history of malignant neoplasm of prostate: Secondary | ICD-10-CM | POA: Diagnosis not present

## 2020-07-17 DIAGNOSIS — M542 Cervicalgia: Secondary | ICD-10-CM | POA: Diagnosis not present

## 2020-07-17 DIAGNOSIS — M7582 Other shoulder lesions, left shoulder: Secondary | ICD-10-CM | POA: Diagnosis not present

## 2020-07-17 DIAGNOSIS — M7581 Other shoulder lesions, right shoulder: Secondary | ICD-10-CM | POA: Diagnosis not present

## 2020-07-31 DIAGNOSIS — G4733 Obstructive sleep apnea (adult) (pediatric): Secondary | ICD-10-CM | POA: Diagnosis not present

## 2020-08-20 DIAGNOSIS — G603 Idiopathic progressive neuropathy: Secondary | ICD-10-CM | POA: Diagnosis not present

## 2020-08-20 DIAGNOSIS — E663 Overweight: Secondary | ICD-10-CM | POA: Diagnosis not present

## 2020-08-20 DIAGNOSIS — R69 Illness, unspecified: Secondary | ICD-10-CM | POA: Diagnosis not present

## 2020-08-20 DIAGNOSIS — E890 Postprocedural hypothyroidism: Secondary | ICD-10-CM | POA: Diagnosis not present

## 2020-08-20 DIAGNOSIS — G4700 Insomnia, unspecified: Secondary | ICD-10-CM | POA: Diagnosis not present

## 2020-08-20 DIAGNOSIS — K5190 Ulcerative colitis, unspecified, without complications: Secondary | ICD-10-CM | POA: Diagnosis not present

## 2020-08-20 DIAGNOSIS — Z932 Ileostomy status: Secondary | ICD-10-CM | POA: Diagnosis not present

## 2020-08-20 DIAGNOSIS — I10 Essential (primary) hypertension: Secondary | ICD-10-CM | POA: Diagnosis not present

## 2020-08-20 DIAGNOSIS — E785 Hyperlipidemia, unspecified: Secondary | ICD-10-CM | POA: Diagnosis not present

## 2020-08-23 DIAGNOSIS — R059 Cough, unspecified: Secondary | ICD-10-CM | POA: Diagnosis not present

## 2020-08-23 DIAGNOSIS — J029 Acute pharyngitis, unspecified: Secondary | ICD-10-CM | POA: Diagnosis not present

## 2020-08-23 DIAGNOSIS — J069 Acute upper respiratory infection, unspecified: Secondary | ICD-10-CM | POA: Diagnosis not present

## 2020-08-23 DIAGNOSIS — Z03818 Encounter for observation for suspected exposure to other biological agents ruled out: Secondary | ICD-10-CM | POA: Diagnosis not present

## 2020-08-30 DIAGNOSIS — G4733 Obstructive sleep apnea (adult) (pediatric): Secondary | ICD-10-CM | POA: Diagnosis not present

## 2020-09-30 DIAGNOSIS — G4733 Obstructive sleep apnea (adult) (pediatric): Secondary | ICD-10-CM | POA: Diagnosis not present

## 2020-11-01 DIAGNOSIS — I10 Essential (primary) hypertension: Secondary | ICD-10-CM | POA: Diagnosis not present

## 2020-11-01 DIAGNOSIS — E782 Mixed hyperlipidemia: Secondary | ICD-10-CM | POA: Diagnosis not present

## 2020-11-01 DIAGNOSIS — R69 Illness, unspecified: Secondary | ICD-10-CM | POA: Diagnosis not present

## 2020-11-01 DIAGNOSIS — M179 Osteoarthritis of knee, unspecified: Secondary | ICD-10-CM | POA: Diagnosis not present

## 2020-11-01 DIAGNOSIS — E039 Hypothyroidism, unspecified: Secondary | ICD-10-CM | POA: Diagnosis not present

## 2020-11-01 DIAGNOSIS — M8580 Other specified disorders of bone density and structure, unspecified site: Secondary | ICD-10-CM | POA: Diagnosis not present

## 2020-11-01 DIAGNOSIS — K219 Gastro-esophageal reflux disease without esophagitis: Secondary | ICD-10-CM | POA: Diagnosis not present

## 2020-11-22 ENCOUNTER — Telehealth: Payer: Self-pay | Admitting: Neurology

## 2020-11-22 NOTE — Telephone Encounter (Signed)
Pt is asking for a call to discuss settings on his CPAP.  Pt states sometimes he feels he is not always getting enough air, please call.(Message routed to Dr Guadelupe Sabin POD)

## 2020-11-22 NOTE — Telephone Encounter (Signed)
Spoke with the patient and advised unfortunately you will need an appointment before we can make any changes to his CPAP.  We have not seen him for CPAP since August 2020.  Patient was disappointed but he did verbalize understanding and his questions were answered.  I scheduled him for an appointment next available with Dr. Rexene Alberts on Tuesday, September 20 at 7:30 AM and also placed him on the wait list for both Dr Rexene Alberts and Amy (saw her last).

## 2020-11-23 DIAGNOSIS — G4733 Obstructive sleep apnea (adult) (pediatric): Secondary | ICD-10-CM | POA: Diagnosis not present

## 2020-11-23 DIAGNOSIS — Z20822 Contact with and (suspected) exposure to covid-19: Secondary | ICD-10-CM | POA: Diagnosis not present

## 2020-12-13 DIAGNOSIS — E291 Testicular hypofunction: Secondary | ICD-10-CM | POA: Diagnosis not present

## 2020-12-13 DIAGNOSIS — Z23 Encounter for immunization: Secondary | ICD-10-CM | POA: Diagnosis not present

## 2020-12-13 DIAGNOSIS — G4733 Obstructive sleep apnea (adult) (pediatric): Secondary | ICD-10-CM | POA: Diagnosis not present

## 2020-12-25 ENCOUNTER — Ambulatory Visit: Payer: Medicare HMO | Admitting: Neurology

## 2020-12-27 DIAGNOSIS — M25519 Pain in unspecified shoulder: Secondary | ICD-10-CM | POA: Diagnosis not present

## 2020-12-27 DIAGNOSIS — Z932 Ileostomy status: Secondary | ICD-10-CM | POA: Diagnosis not present

## 2020-12-27 DIAGNOSIS — Z8042 Family history of malignant neoplasm of prostate: Secondary | ICD-10-CM | POA: Diagnosis not present

## 2020-12-27 DIAGNOSIS — Z85528 Personal history of other malignant neoplasm of kidney: Secondary | ICD-10-CM | POA: Diagnosis not present

## 2020-12-27 DIAGNOSIS — E039 Hypothyroidism, unspecified: Secondary | ICD-10-CM | POA: Diagnosis not present

## 2020-12-27 DIAGNOSIS — R972 Elevated prostate specific antigen [PSA]: Secondary | ICD-10-CM | POA: Diagnosis not present

## 2020-12-27 DIAGNOSIS — J392 Other diseases of pharynx: Secondary | ICD-10-CM | POA: Diagnosis not present

## 2020-12-27 DIAGNOSIS — R5383 Other fatigue: Secondary | ICD-10-CM | POA: Diagnosis not present

## 2021-01-13 IMAGING — MR MR ABDOMEN WO/W CM
9 of 18 series · 20 of 48 positions shown · IV contrast (gadavist)
Comparison: CT urogram 02/01/2019

CLINICAL DATA: 12 mm hyperattenuating right renal lesion seen on
recent CT.

EXAM:
MRI ABDOMEN WITHOUT AND WITH CONTRAST
TECHNIQUE: Multiplanar multisequence MR imaging of the abdomen was performed
both before and after the administration of intravenous contrast.
CONTRAST:  10mL GADAVIST GADOBUTROL 1 MMOL/ML IV SOLN

[Series 3: T2 fat-sat · axial · 5.0mm · 0.78mm/px · z∈[-89,+146]mm · 2 of 48 slices shown]
[im 1/48]
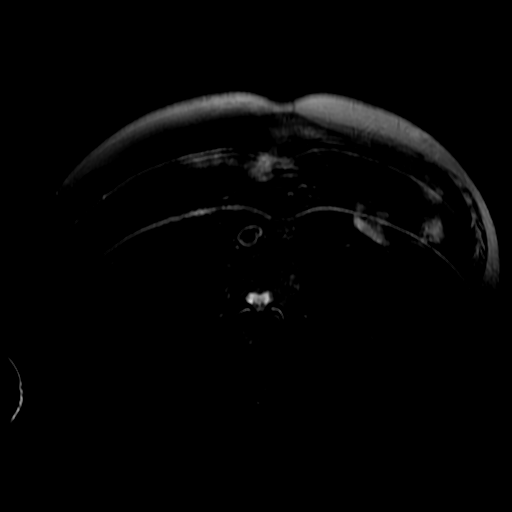
[im 48/48]
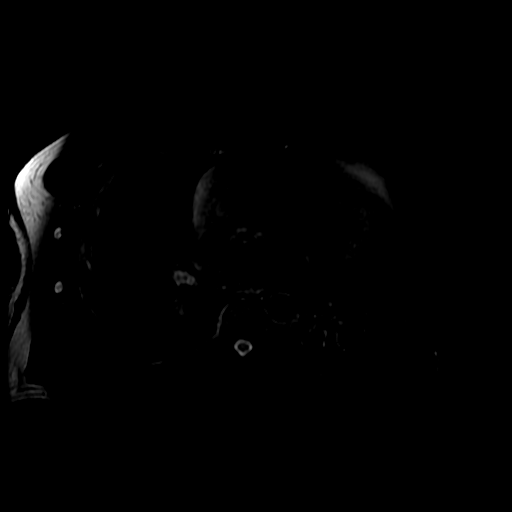

[Series 4: DWI b500 · axial · 6.0mm · 1.56mm/px · z∈[-104,+145]mm · 2 of 66 slices shown]
[im 1/66]
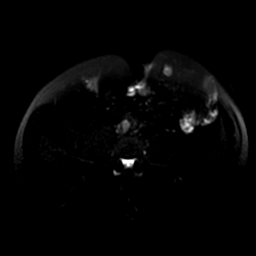
[im 66/66]
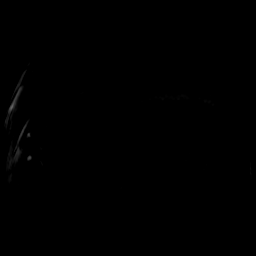

[Series 5: T2 · axial · 5.0mm · 0.78mm/px · z∈[-107,+148]mm · 2 of 52 slices shown (1 of 2)]
[im 1/52]
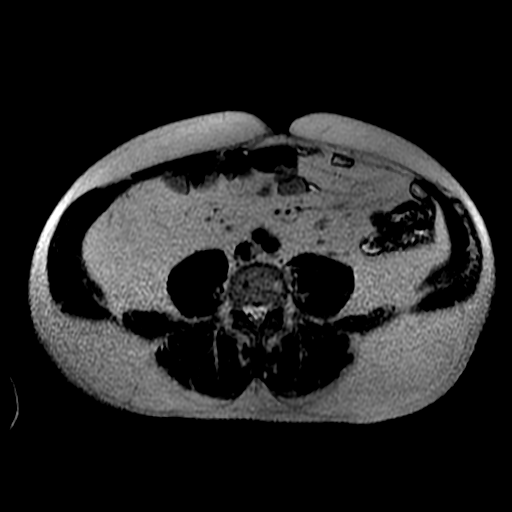
[im 52/52]
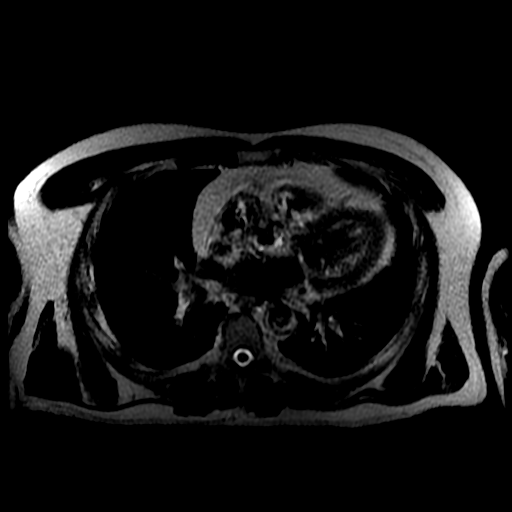

[Series 6: T2 · coronal · 5.0mm · 0.78mm/px · 2 of 48 slices shown (2 of 2)]
[im 1/48]
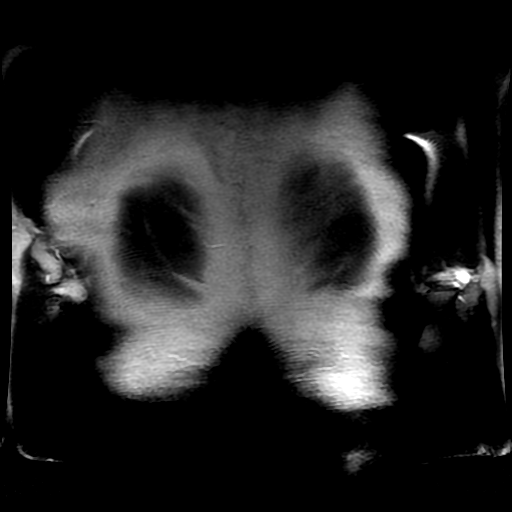
[im 48/48]
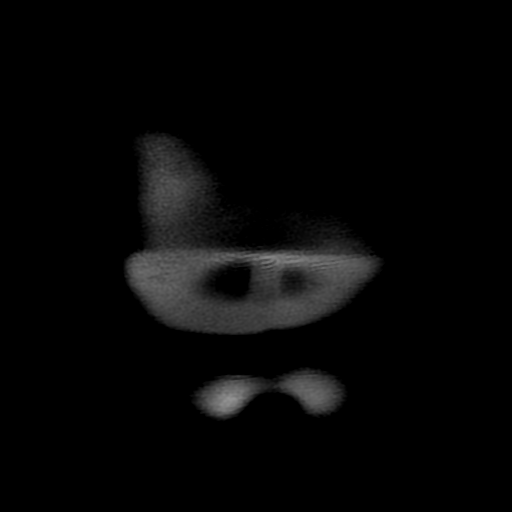

[Series 7: bSSFP · axial · 5.0mm · 0.78mm/px · z∈[-107,+148]mm · 2 of 52 slices shown]
[im 1/52]
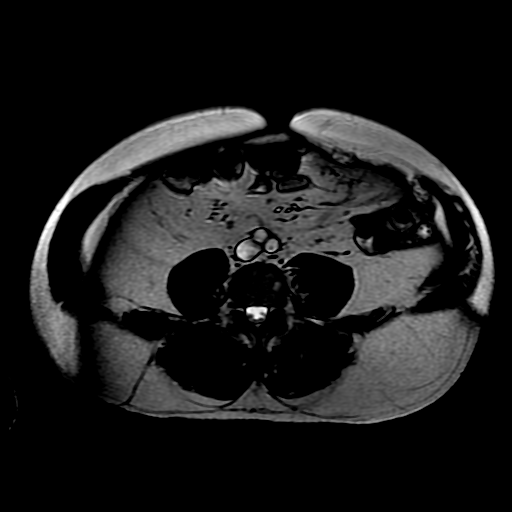
[im 52/52]
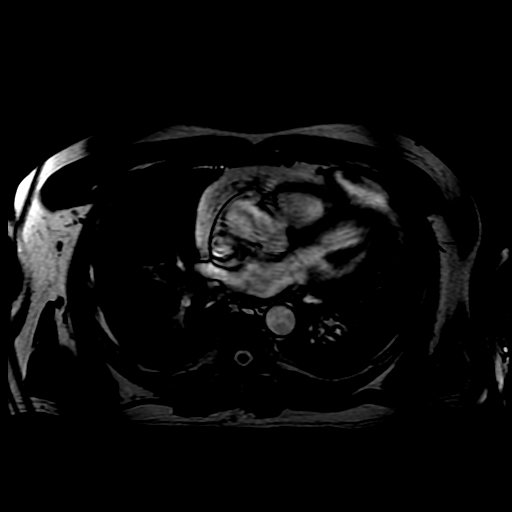

[Series 8: ax dualecho bh · axial · 5.0mm · 0.78mm/px · z∈[-107,+148]mm · 4 of 104 slices shown]
[im 1/104]
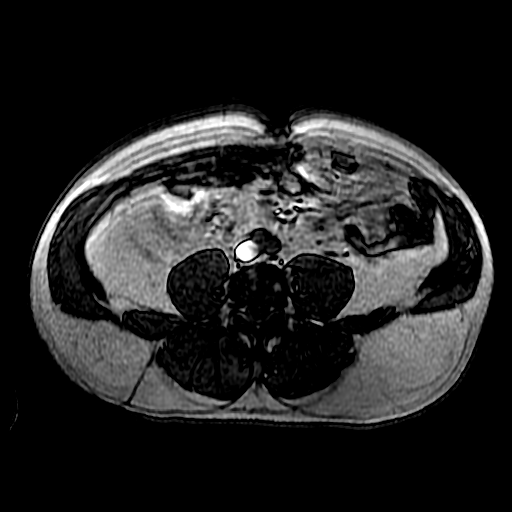
[im 35/104]
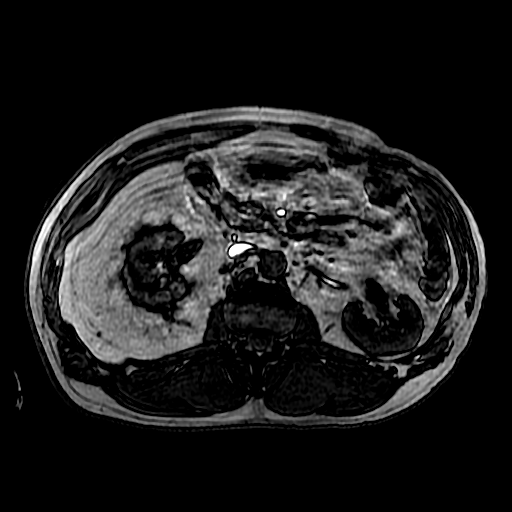
[im 69/104]
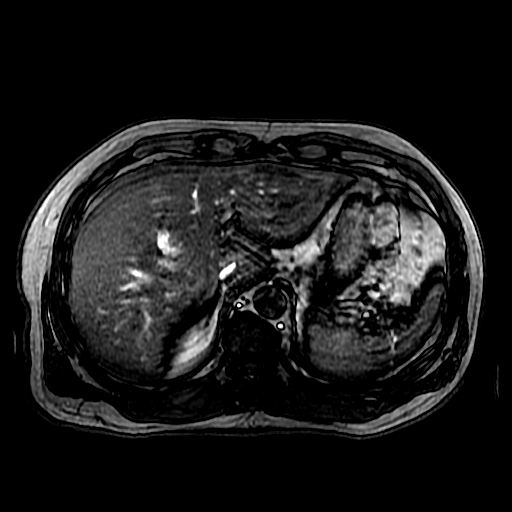
[im 104/104]
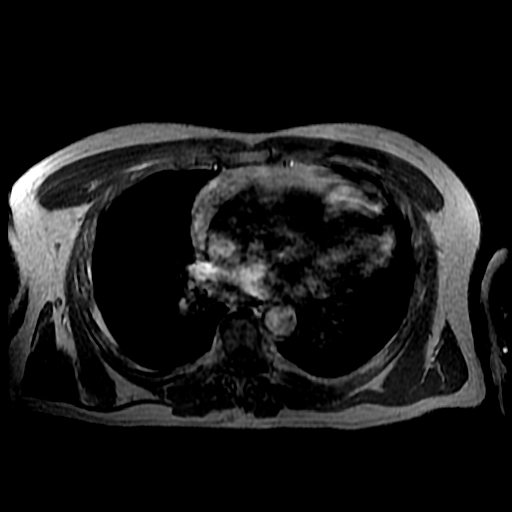

[Series 400: DWI · axial · 6.0mm · 1.56mm/px · 1 of 33 slices shown]
[im 1/33]
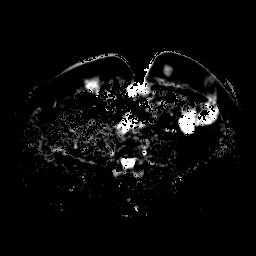

[Series 900: T1 dynamic · axial · 5.6mm · 0.78mm/px · z∈[-99,+145]mm · 3 of 88 slices shown (1 of 2)]
[im 1/88]
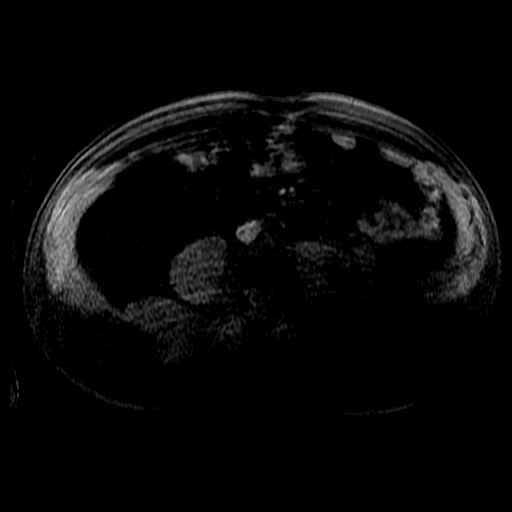
[im 44/88]
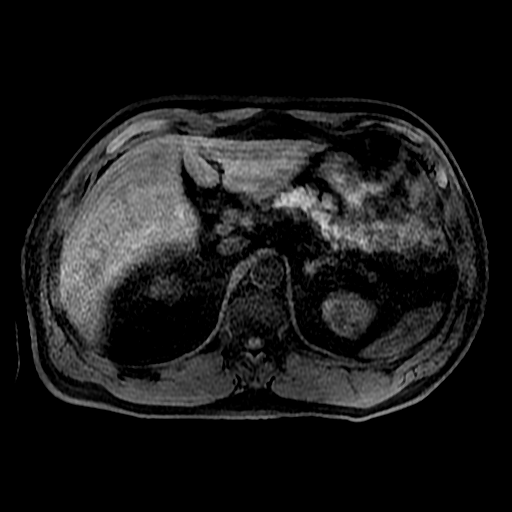
[im 88/88]
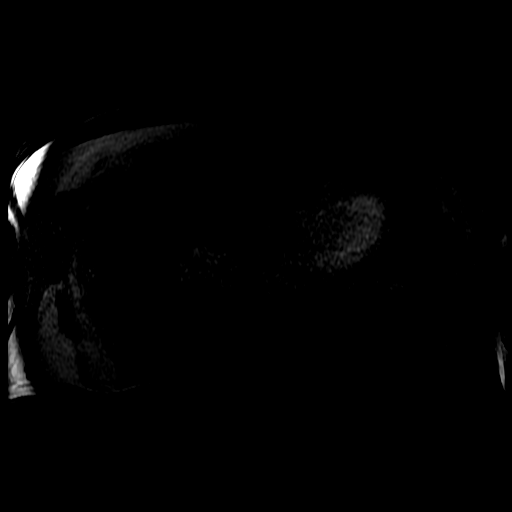

[Series 901: T1 dynamic · axial · 5.6mm · 0.78mm/px · z∈[-99,+22]mm · 2 of 88 slices shown (2 of 2)]
[im 1/88]
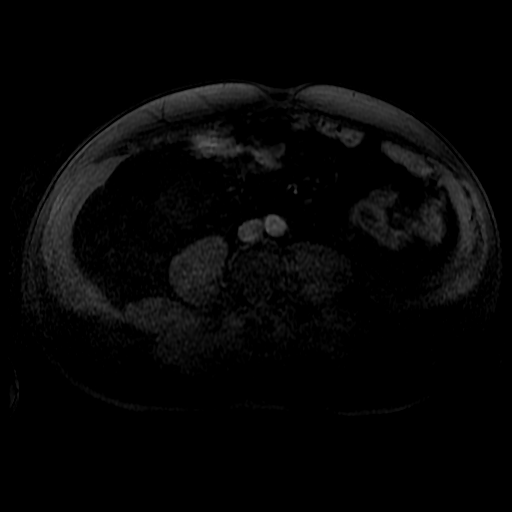
[im 44/88]
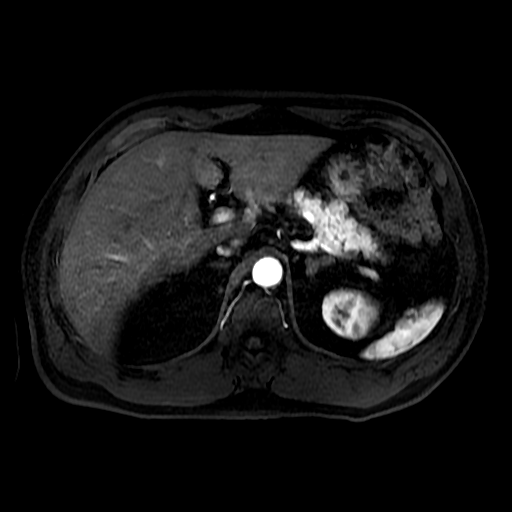

[20 of 48 positions shown; findings below may reference images not displayed]

FINDINGS: Lower chest: Unremarkable.

Hepatobiliary: No suspicious focal abnormality within the liver
parenchyma. Diffuse loss of signal intensity in the liver parenchyma
on out of phase T1 imaging is consistent with fatty deposition.
There is no evidence for gallstones, gallbladder wall thickening, or
pericholecystic fluid. No intrahepatic or extrahepatic biliary
dilation.

Pancreas: No focal mass lesion. No dilatation of the main duct. No
intraparenchymal cyst. No peripancreatic edema.

Spleen:  No splenomegaly. No focal mass lesion.

Adrenals/Urinary Tract:  No adrenal nodule or mass.

12 mm hyperintense lesion is identified in the interpolar right
kidney on precontrast T1 imaging (63/900). This corresponds to the
abnormality seen on the recent CT scan. This lesion shows decreased
signal intensity on precontrast T2 imaging. Subtraction images show
no enhancement in this lesion after IV contrast administration.
Imaging features compatible with a cyst complicated by proteinaceous
debris or hemorrhage.

Left kidney unremarkable.

Stomach/Bowel: Stomach is unremarkable. No gastric wall thickening.
No evidence of outlet obstruction. Duodenum is normally positioned
as is the ligament of Treitz. No small bowel dilatation within the
visualized abdomen. Patient is status post colectomy.

Vascular/Lymphatic: No abdominal aortic aneurysm. No abdominal
lymphadenopathy.

Other:  No intraperitoneal free fluid.

Musculoskeletal: No abnormal marrow signal within the visualized
bony anatomy.
IMPRESSION: 1. 12 mm right renal lesion of concern on recent CT urogram
represents a Bosniak II cyst. No suspicious abnormality in either
kidney.
2. Status post colectomy.
3. Hepatic steatosis.

## 2021-01-30 DIAGNOSIS — C61 Malignant neoplasm of prostate: Secondary | ICD-10-CM | POA: Diagnosis not present

## 2021-01-30 DIAGNOSIS — E039 Hypothyroidism, unspecified: Secondary | ICD-10-CM | POA: Diagnosis not present

## 2021-01-30 DIAGNOSIS — Z932 Ileostomy status: Secondary | ICD-10-CM | POA: Diagnosis not present

## 2021-01-30 DIAGNOSIS — K5190 Ulcerative colitis, unspecified, without complications: Secondary | ICD-10-CM | POA: Diagnosis not present

## 2021-02-14 DIAGNOSIS — E039 Hypothyroidism, unspecified: Secondary | ICD-10-CM | POA: Diagnosis not present

## 2021-02-14 DIAGNOSIS — E782 Mixed hyperlipidemia: Secondary | ICD-10-CM | POA: Diagnosis not present

## 2021-02-14 DIAGNOSIS — R899 Unspecified abnormal finding in specimens from other organs, systems and tissues: Secondary | ICD-10-CM | POA: Diagnosis not present

## 2021-02-20 DIAGNOSIS — Z0000 Encounter for general adult medical examination without abnormal findings: Secondary | ICD-10-CM | POA: Diagnosis not present

## 2021-02-20 DIAGNOSIS — G479 Sleep disorder, unspecified: Secondary | ICD-10-CM | POA: Diagnosis not present

## 2021-02-20 DIAGNOSIS — R69 Illness, unspecified: Secondary | ICD-10-CM | POA: Diagnosis not present

## 2021-02-20 DIAGNOSIS — Z932 Ileostomy status: Secondary | ICD-10-CM | POA: Diagnosis not present

## 2021-02-20 DIAGNOSIS — I10 Essential (primary) hypertension: Secondary | ICD-10-CM | POA: Diagnosis not present

## 2021-02-20 DIAGNOSIS — E782 Mixed hyperlipidemia: Secondary | ICD-10-CM | POA: Diagnosis not present

## 2021-02-20 DIAGNOSIS — K219 Gastro-esophageal reflux disease without esophagitis: Secondary | ICD-10-CM | POA: Diagnosis not present

## 2021-02-20 DIAGNOSIS — N1831 Chronic kidney disease, stage 3a: Secondary | ICD-10-CM | POA: Diagnosis not present

## 2021-02-20 DIAGNOSIS — E039 Hypothyroidism, unspecified: Secondary | ICD-10-CM | POA: Diagnosis not present

## 2021-02-20 DIAGNOSIS — R899 Unspecified abnormal finding in specimens from other organs, systems and tissues: Secondary | ICD-10-CM | POA: Diagnosis not present

## 2021-02-26 ENCOUNTER — Telehealth (HOSPITAL_COMMUNITY): Payer: Self-pay | Admitting: Nurse Practitioner

## 2021-02-26 DIAGNOSIS — K219 Gastro-esophageal reflux disease without esophagitis: Secondary | ICD-10-CM | POA: Diagnosis not present

## 2021-02-26 DIAGNOSIS — E039 Hypothyroidism, unspecified: Secondary | ICD-10-CM | POA: Diagnosis not present

## 2021-02-26 DIAGNOSIS — M8580 Other specified disorders of bone density and structure, unspecified site: Secondary | ICD-10-CM | POA: Diagnosis not present

## 2021-02-26 DIAGNOSIS — M179 Osteoarthritis of knee, unspecified: Secondary | ICD-10-CM | POA: Diagnosis not present

## 2021-02-26 DIAGNOSIS — N1831 Chronic kidney disease, stage 3a: Secondary | ICD-10-CM | POA: Diagnosis not present

## 2021-02-26 DIAGNOSIS — E782 Mixed hyperlipidemia: Secondary | ICD-10-CM | POA: Diagnosis not present

## 2021-02-26 DIAGNOSIS — I10 Essential (primary) hypertension: Secondary | ICD-10-CM | POA: Diagnosis not present

## 2021-02-26 DIAGNOSIS — R69 Illness, unspecified: Secondary | ICD-10-CM | POA: Diagnosis not present

## 2021-02-26 NOTE — Telephone Encounter (Signed)
Returned pt call to schedule appt. Was unable to reach pt, left a message for pt to return call.

## 2021-03-11 DIAGNOSIS — Z23 Encounter for immunization: Secondary | ICD-10-CM | POA: Diagnosis not present

## 2021-03-12 ENCOUNTER — Ambulatory Visit (HOSPITAL_COMMUNITY): Payer: Medicare HMO

## 2021-03-14 DIAGNOSIS — G4733 Obstructive sleep apnea (adult) (pediatric): Secondary | ICD-10-CM | POA: Diagnosis not present

## 2021-03-18 ENCOUNTER — Ambulatory Visit (HOSPITAL_COMMUNITY)
Admission: RE | Admit: 2021-03-18 | Discharge: 2021-03-18 | Disposition: A | Discharge status: Home / Self Care | Payer: Medicare HMO | Source: Ambulatory Visit | Attending: Family Medicine | Admitting: Family Medicine

## 2021-03-18 ENCOUNTER — Other Ambulatory Visit: Payer: Self-pay

## 2021-03-18 DIAGNOSIS — Z432 Encounter for attention to ileostomy: Secondary | ICD-10-CM

## 2021-03-18 NOTE — Progress Notes (Signed)
[facility]   Reason for visit:  RLQ ileostomy, present for 40 years.  Manages well.  No issues with pouching or leaks.  Wants to establish at ostomy clinic as a patient  HPI:  Ulcerative colitis resulting in bowel resection and ileostomy ROS  Review of Systems  Gastrointestinal:        RLQ ileostomy with history of obstruction  All other systems reviewed and are negative. Vital signs:  BP 119/87   Pulse 90   Temp 97.6 F (36.4 C) (Oral)   Resp 18   SpO2 100%  Exam:  Physical Exam Constitutional:      Appearance: He is normal weight.  Pulmonary:     Effort: Pulmonary effort is normal.  Abdominal:     General: Abdomen is flat.  Skin:    General: Skin is warm and dry.  Neurological:     Mental Status: He is alert.  Psychiatric:        Mood and Affect: Mood normal.    Stoma type/location:  RLQ ileostomy Stomal assessment/size:  1" pink and moist Peristomal assessment:  pouch intact  he changes every three days.  Treatment options for stomal/peristomal skin: Barrier ring and coloplast pouches. No supply issues.  Output: soft brown stool Ostomy pouching: 1pc. convex Education provided:  Patient states he is aging and has some concerns about self care later in life.  He is managing completely independently now. He wants to be established with ostomy clinic in case needs arise.     Impression/dx  RLQ ileostomy, history obstruction Discussion  See above.  Independent care Plan  See back as needed.     Visit time: 45 minutes.   Domenic Moras FNP-BC

## 2021-03-19 NOTE — Discharge Instructions (Signed)
Call clinic as needed

## 2021-06-11 DIAGNOSIS — R972 Elevated prostate specific antigen [PSA]: Secondary | ICD-10-CM | POA: Diagnosis not present

## 2021-06-12 DIAGNOSIS — G4733 Obstructive sleep apnea (adult) (pediatric): Secondary | ICD-10-CM | POA: Diagnosis not present

## 2021-06-27 DIAGNOSIS — R972 Elevated prostate specific antigen [PSA]: Secondary | ICD-10-CM | POA: Diagnosis not present

## 2021-07-06 DIAGNOSIS — R972 Elevated prostate specific antigen [PSA]: Secondary | ICD-10-CM | POA: Diagnosis not present

## 2021-09-10 DIAGNOSIS — G4733 Obstructive sleep apnea (adult) (pediatric): Secondary | ICD-10-CM | POA: Diagnosis not present

## 2021-10-10 DIAGNOSIS — G4733 Obstructive sleep apnea (adult) (pediatric): Secondary | ICD-10-CM | POA: Diagnosis not present

## 2021-10-11 ENCOUNTER — Encounter (HOSPITAL_COMMUNITY): Payer: Self-pay | Admitting: Nurse Practitioner

## 2021-10-18 ENCOUNTER — Encounter (HOSPITAL_COMMUNITY): Payer: Self-pay | Admitting: Nurse Practitioner

## 2021-10-29 ENCOUNTER — Encounter (HOSPITAL_COMMUNITY): Payer: Self-pay | Admitting: Nurse Practitioner

## 2021-11-10 DIAGNOSIS — G4733 Obstructive sleep apnea (adult) (pediatric): Secondary | ICD-10-CM | POA: Diagnosis not present

## 2021-12-11 DIAGNOSIS — G4733 Obstructive sleep apnea (adult) (pediatric): Secondary | ICD-10-CM | POA: Diagnosis not present

## 2022-01-06 DIAGNOSIS — R972 Elevated prostate specific antigen [PSA]: Secondary | ICD-10-CM | POA: Diagnosis not present

## 2022-01-07 DIAGNOSIS — G4733 Obstructive sleep apnea (adult) (pediatric): Secondary | ICD-10-CM | POA: Diagnosis not present

## 2022-01-08 DIAGNOSIS — Z932 Ileostomy status: Secondary | ICD-10-CM | POA: Diagnosis not present

## 2022-01-08 DIAGNOSIS — K5190 Ulcerative colitis, unspecified, without complications: Secondary | ICD-10-CM | POA: Diagnosis not present

## 2022-01-08 DIAGNOSIS — E039 Hypothyroidism, unspecified: Secondary | ICD-10-CM | POA: Diagnosis not present

## 2022-01-08 DIAGNOSIS — C61 Malignant neoplasm of prostate: Secondary | ICD-10-CM | POA: Diagnosis not present

## 2022-01-09 DIAGNOSIS — R972 Elevated prostate specific antigen [PSA]: Secondary | ICD-10-CM | POA: Diagnosis not present

## 2022-01-09 DIAGNOSIS — Z85528 Personal history of other malignant neoplasm of kidney: Secondary | ICD-10-CM | POA: Diagnosis not present

## 2022-01-09 DIAGNOSIS — Z8042 Family history of malignant neoplasm of prostate: Secondary | ICD-10-CM | POA: Diagnosis not present

## 2022-01-16 DIAGNOSIS — Z23 Encounter for immunization: Secondary | ICD-10-CM | POA: Diagnosis not present

## 2022-02-25 DIAGNOSIS — I10 Essential (primary) hypertension: Secondary | ICD-10-CM | POA: Diagnosis not present

## 2022-02-25 DIAGNOSIS — E782 Mixed hyperlipidemia: Secondary | ICD-10-CM | POA: Diagnosis not present

## 2022-02-25 DIAGNOSIS — E039 Hypothyroidism, unspecified: Secondary | ICD-10-CM | POA: Diagnosis not present

## 2022-03-06 ENCOUNTER — Other Ambulatory Visit: Payer: Self-pay | Admitting: Family Medicine

## 2022-03-06 DIAGNOSIS — R69 Illness, unspecified: Secondary | ICD-10-CM | POA: Diagnosis not present

## 2022-03-06 DIAGNOSIS — K219 Gastro-esophageal reflux disease without esophagitis: Secondary | ICD-10-CM | POA: Diagnosis not present

## 2022-03-06 DIAGNOSIS — G479 Sleep disorder, unspecified: Secondary | ICD-10-CM | POA: Diagnosis not present

## 2022-03-06 DIAGNOSIS — E785 Hyperlipidemia, unspecified: Secondary | ICD-10-CM

## 2022-03-06 DIAGNOSIS — I10 Essential (primary) hypertension: Secondary | ICD-10-CM | POA: Diagnosis not present

## 2022-03-06 DIAGNOSIS — E039 Hypothyroidism, unspecified: Secondary | ICD-10-CM | POA: Diagnosis not present

## 2022-03-06 DIAGNOSIS — Z0000 Encounter for general adult medical examination without abnormal findings: Secondary | ICD-10-CM | POA: Diagnosis not present

## 2022-03-06 DIAGNOSIS — Z6829 Body mass index (BMI) 29.0-29.9, adult: Secondary | ICD-10-CM | POA: Diagnosis not present

## 2022-03-06 DIAGNOSIS — E782 Mixed hyperlipidemia: Secondary | ICD-10-CM | POA: Diagnosis not present

## 2022-03-06 DIAGNOSIS — N1831 Chronic kidney disease, stage 3a: Secondary | ICD-10-CM | POA: Diagnosis not present

## 2022-03-06 DIAGNOSIS — Z932 Ileostomy status: Secondary | ICD-10-CM | POA: Diagnosis not present

## 2022-03-11 DIAGNOSIS — G4733 Obstructive sleep apnea (adult) (pediatric): Secondary | ICD-10-CM | POA: Diagnosis not present

## 2022-03-17 DIAGNOSIS — K219 Gastro-esophageal reflux disease without esophagitis: Secondary | ICD-10-CM | POA: Diagnosis not present

## 2022-03-17 DIAGNOSIS — R69 Illness, unspecified: Secondary | ICD-10-CM | POA: Diagnosis not present

## 2022-03-17 DIAGNOSIS — Z8249 Family history of ischemic heart disease and other diseases of the circulatory system: Secondary | ICD-10-CM | POA: Diagnosis not present

## 2022-03-17 DIAGNOSIS — G4700 Insomnia, unspecified: Secondary | ICD-10-CM | POA: Diagnosis not present

## 2022-03-17 DIAGNOSIS — G4733 Obstructive sleep apnea (adult) (pediatric): Secondary | ICD-10-CM | POA: Diagnosis not present

## 2022-03-17 DIAGNOSIS — I10 Essential (primary) hypertension: Secondary | ICD-10-CM | POA: Diagnosis not present

## 2022-03-17 DIAGNOSIS — Z85528 Personal history of other malignant neoplasm of kidney: Secondary | ICD-10-CM | POA: Diagnosis not present

## 2022-03-17 DIAGNOSIS — E890 Postprocedural hypothyroidism: Secondary | ICD-10-CM | POA: Diagnosis not present

## 2022-03-17 DIAGNOSIS — M1990 Unspecified osteoarthritis, unspecified site: Secondary | ICD-10-CM | POA: Diagnosis not present

## 2022-03-17 DIAGNOSIS — Z809 Family history of malignant neoplasm, unspecified: Secondary | ICD-10-CM | POA: Diagnosis not present

## 2022-03-17 DIAGNOSIS — F419 Anxiety disorder, unspecified: Secondary | ICD-10-CM | POA: Diagnosis not present

## 2022-03-17 DIAGNOSIS — J45909 Unspecified asthma, uncomplicated: Secondary | ICD-10-CM | POA: Diagnosis not present

## 2022-03-19 DIAGNOSIS — E039 Hypothyroidism, unspecified: Secondary | ICD-10-CM | POA: Diagnosis not present

## 2022-03-19 DIAGNOSIS — K5190 Ulcerative colitis, unspecified, without complications: Secondary | ICD-10-CM | POA: Diagnosis not present

## 2022-03-19 DIAGNOSIS — C61 Malignant neoplasm of prostate: Secondary | ICD-10-CM | POA: Diagnosis not present

## 2022-03-19 DIAGNOSIS — Z932 Ileostomy status: Secondary | ICD-10-CM | POA: Diagnosis not present

## 2022-04-08 ENCOUNTER — Other Ambulatory Visit: Payer: Medicare HMO

## 2022-05-01 ENCOUNTER — Ambulatory Visit
Admission: RE | Admit: 2022-05-01 | Discharge: 2022-05-01 | Disposition: A | Discharge status: Home / Self Care | Payer: No Typology Code available for payment source | Source: Ambulatory Visit | Attending: Family Medicine | Admitting: Family Medicine

## 2022-05-01 DIAGNOSIS — E785 Hyperlipidemia, unspecified: Secondary | ICD-10-CM

## 2022-05-09 DIAGNOSIS — R69 Illness, unspecified: Secondary | ICD-10-CM | POA: Diagnosis not present

## 2022-05-20 DIAGNOSIS — L739 Follicular disorder, unspecified: Secondary | ICD-10-CM | POA: Diagnosis not present

## 2022-05-20 DIAGNOSIS — D2261 Melanocytic nevi of right upper limb, including shoulder: Secondary | ICD-10-CM | POA: Diagnosis not present

## 2022-05-20 DIAGNOSIS — L578 Other skin changes due to chronic exposure to nonionizing radiation: Secondary | ICD-10-CM | POA: Diagnosis not present

## 2022-05-20 DIAGNOSIS — L821 Other seborrheic keratosis: Secondary | ICD-10-CM | POA: Diagnosis not present

## 2022-05-20 DIAGNOSIS — B360 Pityriasis versicolor: Secondary | ICD-10-CM | POA: Diagnosis not present

## 2022-05-20 DIAGNOSIS — L814 Other melanin hyperpigmentation: Secondary | ICD-10-CM | POA: Diagnosis not present

## 2022-05-20 DIAGNOSIS — D1801 Hemangioma of skin and subcutaneous tissue: Secondary | ICD-10-CM | POA: Diagnosis not present

## 2022-05-20 DIAGNOSIS — D485 Neoplasm of uncertain behavior of skin: Secondary | ICD-10-CM | POA: Diagnosis not present

## 2022-05-27 DIAGNOSIS — R69 Illness, unspecified: Secondary | ICD-10-CM | POA: Diagnosis not present

## 2022-05-29 DIAGNOSIS — E782 Mixed hyperlipidemia: Secondary | ICD-10-CM | POA: Diagnosis not present

## 2022-05-29 DIAGNOSIS — E039 Hypothyroidism, unspecified: Secondary | ICD-10-CM | POA: Diagnosis not present

## 2022-06-04 DIAGNOSIS — G4733 Obstructive sleep apnea (adult) (pediatric): Secondary | ICD-10-CM | POA: Diagnosis not present

## 2022-06-05 DIAGNOSIS — R69 Illness, unspecified: Secondary | ICD-10-CM | POA: Diagnosis not present

## 2022-06-05 DIAGNOSIS — E039 Hypothyroidism, unspecified: Secondary | ICD-10-CM | POA: Diagnosis not present

## 2022-06-05 DIAGNOSIS — E782 Mixed hyperlipidemia: Secondary | ICD-10-CM | POA: Diagnosis not present

## 2022-06-12 DIAGNOSIS — R69 Illness, unspecified: Secondary | ICD-10-CM | POA: Diagnosis not present

## 2022-06-16 DIAGNOSIS — R69 Illness, unspecified: Secondary | ICD-10-CM | POA: Diagnosis not present

## 2022-06-25 DIAGNOSIS — N1831 Chronic kidney disease, stage 3a: Secondary | ICD-10-CM | POA: Diagnosis not present

## 2022-06-25 DIAGNOSIS — R531 Weakness: Secondary | ICD-10-CM | POA: Diagnosis not present

## 2022-06-25 DIAGNOSIS — G629 Polyneuropathy, unspecified: Secondary | ICD-10-CM | POA: Diagnosis not present

## 2022-06-25 DIAGNOSIS — E039 Hypothyroidism, unspecified: Secondary | ICD-10-CM | POA: Diagnosis not present

## 2022-06-25 DIAGNOSIS — Z932 Ileostomy status: Secondary | ICD-10-CM | POA: Diagnosis not present

## 2022-07-01 DIAGNOSIS — R634 Abnormal weight loss: Secondary | ICD-10-CM | POA: Diagnosis not present

## 2022-07-01 DIAGNOSIS — E039 Hypothyroidism, unspecified: Secondary | ICD-10-CM | POA: Diagnosis not present

## 2022-07-01 DIAGNOSIS — R69 Illness, unspecified: Secondary | ICD-10-CM | POA: Diagnosis not present

## 2022-07-01 DIAGNOSIS — Z808 Family history of malignant neoplasm of other organs or systems: Secondary | ICD-10-CM | POA: Diagnosis not present

## 2022-07-01 DIAGNOSIS — Z6829 Body mass index (BMI) 29.0-29.9, adult: Secondary | ICD-10-CM | POA: Diagnosis not present

## 2022-07-01 DIAGNOSIS — R531 Weakness: Secondary | ICD-10-CM | POA: Diagnosis not present

## 2022-07-03 DIAGNOSIS — G4733 Obstructive sleep apnea (adult) (pediatric): Secondary | ICD-10-CM | POA: Diagnosis not present

## 2022-08-03 DIAGNOSIS — G4733 Obstructive sleep apnea (adult) (pediatric): Secondary | ICD-10-CM | POA: Diagnosis not present

## 2022-08-05 DIAGNOSIS — G4733 Obstructive sleep apnea (adult) (pediatric): Secondary | ICD-10-CM | POA: Diagnosis not present

## 2022-08-11 DIAGNOSIS — E039 Hypothyroidism, unspecified: Secondary | ICD-10-CM | POA: Diagnosis not present

## 2022-08-11 DIAGNOSIS — Z932 Ileostomy status: Secondary | ICD-10-CM | POA: Diagnosis not present

## 2022-08-11 DIAGNOSIS — C61 Malignant neoplasm of prostate: Secondary | ICD-10-CM | POA: Diagnosis not present

## 2022-08-11 DIAGNOSIS — K5190 Ulcerative colitis, unspecified, without complications: Secondary | ICD-10-CM | POA: Diagnosis not present

## 2022-08-18 DIAGNOSIS — R972 Elevated prostate specific antigen [PSA]: Secondary | ICD-10-CM | POA: Diagnosis not present

## 2022-08-26 DIAGNOSIS — R531 Weakness: Secondary | ICD-10-CM | POA: Diagnosis not present

## 2022-08-26 DIAGNOSIS — E039 Hypothyroidism, unspecified: Secondary | ICD-10-CM | POA: Diagnosis not present

## 2022-09-04 DIAGNOSIS — G4733 Obstructive sleep apnea (adult) (pediatric): Secondary | ICD-10-CM | POA: Diagnosis not present

## 2022-09-22 DIAGNOSIS — Z6829 Body mass index (BMI) 29.0-29.9, adult: Secondary | ICD-10-CM | POA: Diagnosis not present

## 2022-09-22 DIAGNOSIS — K219 Gastro-esophageal reflux disease without esophagitis: Secondary | ICD-10-CM | POA: Diagnosis not present

## 2022-09-22 DIAGNOSIS — E291 Testicular hypofunction: Secondary | ICD-10-CM | POA: Diagnosis not present

## 2022-09-22 DIAGNOSIS — R5381 Other malaise: Secondary | ICD-10-CM | POA: Diagnosis not present

## 2022-09-22 DIAGNOSIS — E039 Hypothyroidism, unspecified: Secondary | ICD-10-CM | POA: Diagnosis not present

## 2022-09-29 DIAGNOSIS — Z008 Encounter for other general examination: Secondary | ICD-10-CM | POA: Diagnosis not present

## 2022-09-29 DIAGNOSIS — Z85528 Personal history of other malignant neoplasm of kidney: Secondary | ICD-10-CM | POA: Diagnosis not present

## 2022-09-29 DIAGNOSIS — E039 Hypothyroidism, unspecified: Secondary | ICD-10-CM | POA: Diagnosis not present

## 2022-09-29 DIAGNOSIS — K219 Gastro-esophageal reflux disease without esophagitis: Secondary | ICD-10-CM | POA: Diagnosis not present

## 2022-09-29 DIAGNOSIS — N529 Male erectile dysfunction, unspecified: Secondary | ICD-10-CM | POA: Diagnosis not present

## 2022-09-29 DIAGNOSIS — I10 Essential (primary) hypertension: Secondary | ICD-10-CM | POA: Diagnosis not present

## 2022-09-29 DIAGNOSIS — F321 Major depressive disorder, single episode, moderate: Secondary | ICD-10-CM | POA: Diagnosis not present

## 2022-10-03 DIAGNOSIS — G4733 Obstructive sleep apnea (adult) (pediatric): Secondary | ICD-10-CM | POA: Diagnosis not present

## 2022-10-05 DIAGNOSIS — G4733 Obstructive sleep apnea (adult) (pediatric): Secondary | ICD-10-CM | POA: Diagnosis not present

## 2022-11-02 DIAGNOSIS — G4733 Obstructive sleep apnea (adult) (pediatric): Secondary | ICD-10-CM | POA: Diagnosis not present

## 2022-11-25 DIAGNOSIS — M25552 Pain in left hip: Secondary | ICD-10-CM | POA: Diagnosis not present

## 2022-12-01 DIAGNOSIS — R972 Elevated prostate specific antigen [PSA]: Secondary | ICD-10-CM | POA: Diagnosis not present

## 2022-12-01 DIAGNOSIS — G4733 Obstructive sleep apnea (adult) (pediatric): Secondary | ICD-10-CM | POA: Diagnosis not present

## 2022-12-01 DIAGNOSIS — M25552 Pain in left hip: Secondary | ICD-10-CM | POA: Diagnosis not present

## 2022-12-01 DIAGNOSIS — E039 Hypothyroidism, unspecified: Secondary | ICD-10-CM | POA: Diagnosis not present

## 2022-12-01 DIAGNOSIS — E291 Testicular hypofunction: Secondary | ICD-10-CM | POA: Diagnosis not present

## 2022-12-03 DIAGNOSIS — G4733 Obstructive sleep apnea (adult) (pediatric): Secondary | ICD-10-CM | POA: Diagnosis not present

## 2022-12-04 DIAGNOSIS — M25552 Pain in left hip: Secondary | ICD-10-CM | POA: Diagnosis not present

## 2022-12-10 DIAGNOSIS — Z85528 Personal history of other malignant neoplasm of kidney: Secondary | ICD-10-CM | POA: Diagnosis not present

## 2022-12-10 DIAGNOSIS — E291 Testicular hypofunction: Secondary | ICD-10-CM | POA: Diagnosis not present

## 2022-12-10 DIAGNOSIS — N529 Male erectile dysfunction, unspecified: Secondary | ICD-10-CM | POA: Diagnosis not present

## 2022-12-11 DIAGNOSIS — M25552 Pain in left hip: Secondary | ICD-10-CM | POA: Diagnosis not present

## 2022-12-15 DIAGNOSIS — H2513 Age-related nuclear cataract, bilateral: Secondary | ICD-10-CM | POA: Diagnosis not present

## 2022-12-15 DIAGNOSIS — H43813 Vitreous degeneration, bilateral: Secondary | ICD-10-CM | POA: Diagnosis not present

## 2022-12-15 DIAGNOSIS — M25552 Pain in left hip: Secondary | ICD-10-CM | POA: Diagnosis not present

## 2022-12-15 DIAGNOSIS — H52203 Unspecified astigmatism, bilateral: Secondary | ICD-10-CM | POA: Diagnosis not present

## 2022-12-15 DIAGNOSIS — H5203 Hypermetropia, bilateral: Secondary | ICD-10-CM | POA: Diagnosis not present

## 2022-12-15 DIAGNOSIS — H11153 Pinguecula, bilateral: Secondary | ICD-10-CM | POA: Diagnosis not present

## 2022-12-15 DIAGNOSIS — H524 Presbyopia: Secondary | ICD-10-CM | POA: Diagnosis not present

## 2022-12-15 DIAGNOSIS — H25013 Cortical age-related cataract, bilateral: Secondary | ICD-10-CM | POA: Diagnosis not present

## 2022-12-18 DIAGNOSIS — J029 Acute pharyngitis, unspecified: Secondary | ICD-10-CM | POA: Diagnosis not present

## 2022-12-18 DIAGNOSIS — Z03818 Encounter for observation for suspected exposure to other biological agents ruled out: Secondary | ICD-10-CM | POA: Diagnosis not present

## 2022-12-30 DIAGNOSIS — E291 Testicular hypofunction: Secondary | ICD-10-CM | POA: Diagnosis not present

## 2023-01-02 DIAGNOSIS — R1310 Dysphagia, unspecified: Secondary | ICD-10-CM | POA: Diagnosis not present

## 2023-01-02 DIAGNOSIS — K5190 Ulcerative colitis, unspecified, without complications: Secondary | ICD-10-CM | POA: Diagnosis not present

## 2023-01-02 DIAGNOSIS — R0989 Other specified symptoms and signs involving the circulatory and respiratory systems: Secondary | ICD-10-CM | POA: Diagnosis not present

## 2023-01-03 DIAGNOSIS — G4733 Obstructive sleep apnea (adult) (pediatric): Secondary | ICD-10-CM | POA: Diagnosis not present

## 2023-01-06 DIAGNOSIS — Z23 Encounter for immunization: Secondary | ICD-10-CM | POA: Diagnosis not present

## 2023-01-12 DIAGNOSIS — E291 Testicular hypofunction: Secondary | ICD-10-CM | POA: Diagnosis not present

## 2023-01-13 DIAGNOSIS — K222 Esophageal obstruction: Secondary | ICD-10-CM | POA: Diagnosis not present

## 2023-01-13 DIAGNOSIS — R1310 Dysphagia, unspecified: Secondary | ICD-10-CM | POA: Diagnosis not present

## 2023-01-14 ENCOUNTER — Other Ambulatory Visit: Payer: Self-pay | Admitting: Gastroenterology

## 2023-01-14 DIAGNOSIS — R1310 Dysphagia, unspecified: Secondary | ICD-10-CM

## 2023-01-22 DIAGNOSIS — E039 Hypothyroidism, unspecified: Secondary | ICD-10-CM | POA: Diagnosis not present

## 2023-01-22 DIAGNOSIS — Z932 Ileostomy status: Secondary | ICD-10-CM | POA: Diagnosis not present

## 2023-01-22 DIAGNOSIS — K5190 Ulcerative colitis, unspecified, without complications: Secondary | ICD-10-CM | POA: Diagnosis not present

## 2023-01-22 DIAGNOSIS — C61 Malignant neoplasm of prostate: Secondary | ICD-10-CM | POA: Diagnosis not present

## 2023-01-29 ENCOUNTER — Ambulatory Visit
Admission: RE | Admit: 2023-01-29 | Discharge: 2023-01-29 | Disposition: A | Discharge status: Home / Self Care | Payer: Medicare HMO | Source: Ambulatory Visit | Attending: Gastroenterology | Admitting: Gastroenterology

## 2023-01-29 ENCOUNTER — Other Ambulatory Visit: Payer: Self-pay | Admitting: Gastroenterology

## 2023-01-29 DIAGNOSIS — R1310 Dysphagia, unspecified: Secondary | ICD-10-CM

## 2023-02-02 DIAGNOSIS — G4733 Obstructive sleep apnea (adult) (pediatric): Secondary | ICD-10-CM | POA: Diagnosis not present

## 2023-02-05 ENCOUNTER — Other Ambulatory Visit: Payer: Self-pay | Admitting: Gastroenterology

## 2023-02-05 DIAGNOSIS — M25562 Pain in left knee: Secondary | ICD-10-CM | POA: Diagnosis not present

## 2023-02-05 DIAGNOSIS — E890 Postprocedural hypothyroidism: Secondary | ICD-10-CM | POA: Diagnosis not present

## 2023-02-05 DIAGNOSIS — E559 Vitamin D deficiency, unspecified: Secondary | ICD-10-CM | POA: Diagnosis not present

## 2023-02-05 DIAGNOSIS — R972 Elevated prostate specific antigen [PSA]: Secondary | ICD-10-CM | POA: Diagnosis not present

## 2023-02-05 DIAGNOSIS — E782 Mixed hyperlipidemia: Secondary | ICD-10-CM | POA: Diagnosis not present

## 2023-02-09 DIAGNOSIS — E890 Postprocedural hypothyroidism: Secondary | ICD-10-CM | POA: Diagnosis not present

## 2023-02-09 DIAGNOSIS — E559 Vitamin D deficiency, unspecified: Secondary | ICD-10-CM | POA: Diagnosis not present

## 2023-03-01 ENCOUNTER — Emergency Department (HOSPITAL_BASED_OUTPATIENT_CLINIC_OR_DEPARTMENT_OTHER): Payer: Medicare HMO

## 2023-03-01 ENCOUNTER — Other Ambulatory Visit: Payer: Self-pay

## 2023-03-01 ENCOUNTER — Observation Stay (HOSPITAL_BASED_OUTPATIENT_CLINIC_OR_DEPARTMENT_OTHER)
Admission: EM | Admit: 2023-03-01 | Discharge: 2023-03-03 | Disposition: A | Discharge status: Home / Self Care | Payer: Medicare HMO | Attending: Internal Medicine | Admitting: Internal Medicine

## 2023-03-01 ENCOUNTER — Encounter (HOSPITAL_BASED_OUTPATIENT_CLINIC_OR_DEPARTMENT_OTHER): Payer: Self-pay

## 2023-03-01 DIAGNOSIS — Z7982 Long term (current) use of aspirin: Secondary | ICD-10-CM | POA: Insufficient documentation

## 2023-03-01 DIAGNOSIS — G459 Transient cerebral ischemic attack, unspecified: Secondary | ICD-10-CM | POA: Diagnosis not present

## 2023-03-01 DIAGNOSIS — E291 Testicular hypofunction: Secondary | ICD-10-CM | POA: Insufficient documentation

## 2023-03-01 DIAGNOSIS — Z79899 Other long term (current) drug therapy: Secondary | ICD-10-CM | POA: Diagnosis not present

## 2023-03-01 DIAGNOSIS — Z96653 Presence of artificial knee joint, bilateral: Secondary | ICD-10-CM | POA: Insufficient documentation

## 2023-03-01 DIAGNOSIS — R202 Paresthesia of skin: Secondary | ICD-10-CM | POA: Diagnosis not present

## 2023-03-01 DIAGNOSIS — I951 Orthostatic hypotension: Secondary | ICD-10-CM | POA: Insufficient documentation

## 2023-03-01 DIAGNOSIS — N1831 Chronic kidney disease, stage 3a: Secondary | ICD-10-CM | POA: Diagnosis not present

## 2023-03-01 DIAGNOSIS — Z932 Ileostomy status: Secondary | ICD-10-CM | POA: Diagnosis not present

## 2023-03-01 DIAGNOSIS — E039 Hypothyroidism, unspecified: Secondary | ICD-10-CM | POA: Insufficient documentation

## 2023-03-01 DIAGNOSIS — R531 Weakness: Secondary | ICD-10-CM | POA: Diagnosis not present

## 2023-03-01 DIAGNOSIS — R2990 Unspecified symptoms and signs involving the nervous system: Secondary | ICD-10-CM

## 2023-03-01 DIAGNOSIS — I639 Cerebral infarction, unspecified: Secondary | ICD-10-CM

## 2023-03-01 DIAGNOSIS — J45909 Unspecified asthma, uncomplicated: Secondary | ICD-10-CM | POA: Insufficient documentation

## 2023-03-01 DIAGNOSIS — I129 Hypertensive chronic kidney disease with stage 1 through stage 4 chronic kidney disease, or unspecified chronic kidney disease: Secondary | ICD-10-CM | POA: Diagnosis not present

## 2023-03-01 DIAGNOSIS — I6381 Other cerebral infarction due to occlusion or stenosis of small artery: Principal | ICD-10-CM | POA: Insufficient documentation

## 2023-03-01 DIAGNOSIS — Z85528 Personal history of other malignant neoplasm of kidney: Secondary | ICD-10-CM | POA: Insufficient documentation

## 2023-03-01 DIAGNOSIS — R200 Anesthesia of skin: Secondary | ICD-10-CM | POA: Diagnosis not present

## 2023-03-01 HISTORY — DX: Unspecified symptoms and signs involving the nervous system: R29.90

## 2023-03-01 LAB — RAPID URINE DRUG SCREEN, HOSP PERFORMED
Amphetamines: NOT DETECTED
Barbiturates: NOT DETECTED
Benzodiazepines: NOT DETECTED
Cocaine: NOT DETECTED
Opiates: NOT DETECTED
Tetrahydrocannabinol: NOT DETECTED

## 2023-03-01 LAB — URINALYSIS, ROUTINE W REFLEX MICROSCOPIC
Bilirubin Urine: NEGATIVE
Glucose, UA: NEGATIVE mg/dL
Hgb urine dipstick: NEGATIVE
Ketones, ur: NEGATIVE mg/dL
Leukocytes,Ua: NEGATIVE
Nitrite: NEGATIVE
Protein, ur: NEGATIVE mg/dL
Specific Gravity, Urine: <=1.005 (ref 1.005–1.030)
pH: 5.5 (ref 5.0–8.0)

## 2023-03-01 LAB — DIFFERENTIAL
Abs Immature Granulocytes: 0.03 10*3/uL (ref 0.00–0.07)
Basophils Absolute: 0.1 10*3/uL (ref 0.0–0.1)
Basophils Relative: 1 %
Eosinophils Absolute: 0.3 10*3/uL (ref 0.0–0.5)
Eosinophils Relative: 2 %
Immature Granulocytes: 0 %
Lymphocytes Relative: 27 %
Lymphs Abs: 3 10*3/uL (ref 0.7–4.0)
Monocytes Absolute: 1.5 10*3/uL — ABNORMAL HIGH (ref 0.1–1.0)
Monocytes Relative: 13 %
Neutro Abs: 6.2 10*3/uL (ref 1.7–7.7)
Neutrophils Relative %: 57 %

## 2023-03-01 LAB — COMPREHENSIVE METABOLIC PANEL
ALT: 30 U/L (ref 0–44)
AST: 27 U/L (ref 15–41)
Albumin: 3.9 g/dL (ref 3.5–5.0)
Alkaline Phosphatase: 57 U/L (ref 38–126)
Anion gap: 13 (ref 5–15)
BUN: 15 mg/dL (ref 8–23)
CO2: 24 mmol/L (ref 22–32)
Calcium: 9.8 mg/dL (ref 8.9–10.3)
Chloride: 103 mmol/L (ref 98–111)
Creatinine, Ser: 1.34 mg/dL — ABNORMAL HIGH (ref 0.61–1.24)
GFR, Estimated: 57 mL/min — ABNORMAL LOW (ref >60)
Glucose, Bld: 84 mg/dL (ref 70–99)
Potassium: 3.7 mmol/L (ref 3.5–5.1)
Sodium: 140 mmol/L (ref 135–145)
Total Bilirubin: 0.9 mg/dL (ref <1.2)
Total Protein: 7.1 g/dL (ref 6.5–8.1)

## 2023-03-01 LAB — CBC
HCT: 45 % (ref 39.0–52.0)
Hemoglobin: 15.1 g/dL (ref 13.0–17.0)
MCH: 30.8 pg (ref 26.0–34.0)
MCHC: 33.6 g/dL (ref 30.0–36.0)
MCV: 91.6 fL (ref 80.0–100.0)
Platelets: 308 10*3/uL (ref 150–400)
RBC: 4.91 MIL/uL (ref 4.22–5.81)
RDW: 12.4 % (ref 11.5–15.5)
WBC: 11 10*3/uL — ABNORMAL HIGH (ref 4.0–10.5)
nRBC: 0 % (ref 0.0–0.2)

## 2023-03-01 LAB — HEMOGLOBIN A1C
Hgb A1c MFr Bld: 5.5 % (ref 4.8–5.6)
Mean Plasma Glucose: 111.15 mg/dL

## 2023-03-01 LAB — PROTIME-INR
INR: 1 (ref 0.8–1.2)
Prothrombin Time: 13.7 s (ref 11.4–15.2)

## 2023-03-01 LAB — ETHANOL: Alcohol, Ethyl (B): <10 mg/dL (ref <10)

## 2023-03-01 LAB — APTT: aPTT: 29 s (ref 24–36)

## 2023-03-01 LAB — CBG MONITORING, ED: Glucose-Capillary: 81 mg/dL (ref 70–99)

## 2023-03-01 MED ORDER — STROKE: EARLY STAGES OF RECOVERY BOOK
Freq: Once | Status: DC
  Filled 2023-03-01: qty 1

## 2023-03-01 MED ORDER — TRAZODONE HCL 50 MG PO TABS: 75.0000 mg | ORAL_TABLET | Freq: Every day | ORAL | Status: DC

## 2023-03-01 MED ORDER — ASPIRIN 325 MG PO TABS
325.0000 mg | ORAL_TABLET | Freq: Once | ORAL | Status: AC
  Administered 2023-03-01: 325 mg via ORAL
  Filled 2023-03-01: qty 1

## 2023-03-01 MED ORDER — ASPIRIN 81 MG PO CHEW
81.0000 mg | CHEWABLE_TABLET | Freq: Every day | ORAL | Status: DC
  Administered 2023-03-02 – 2023-03-03 (×2): 81 mg via ORAL
  Filled 2023-03-01 (×2): qty 1

## 2023-03-01 MED ORDER — CLOPIDOGREL BISULFATE 75 MG PO TABS
75.0000 mg | ORAL_TABLET | Freq: Every day | ORAL | Status: DC
  Administered 2023-03-02 – 2023-03-03 (×2): 75 mg via ORAL
  Filled 2023-03-01 (×2): qty 1

## 2023-03-01 MED ORDER — TRAZODONE HCL 50 MG PO TABS
25.0000 mg | ORAL_TABLET | Freq: Every day | ORAL | Status: DC
  Administered 2023-03-01: 12.5 mg via ORAL

## 2023-03-01 MED ORDER — CLOPIDOGREL BISULFATE 300 MG PO TABS
300.0000 mg | ORAL_TABLET | Freq: Once | ORAL | Status: AC
  Administered 2023-03-01: 300 mg via ORAL
  Filled 2023-03-01: qty 1

## 2023-03-01 MED ORDER — IOHEXOL 350 MG/ML SOLN
75.0000 mL | Freq: Once | INTRAVENOUS | Status: AC | PRN
  Administered 2023-03-01: 75 mL via INTRAVENOUS

## 2023-03-01 NOTE — ED Notes (Signed)
Back to room.

## 2023-03-01 NOTE — ED Notes (Signed)
Tele neurologist on screen.

## 2023-03-01 NOTE — Consult Note (Signed)
[facility] Telemedicine Consult   Requesting Provider: Alvino Blood Consult Participants: Myself, bedside Location of the provider: [facility] [address] Location of the patient: [facility]  This consult was provided via telemedicine with 2-way video and audio communication. The patient/family was informed that care would be provided in this way and agreed to receive care in this manner.    Chief Complaint: Left sided tingling and dyscoordination  HPI: 72 year old gentleman with past medical history significant for hypertension, obstructive sleep apnea, remote ulcerative colitis s/p total colectomy many years ago with ileostomy in place, left kidney cancer, CKD stage IIIa, peripheral neuropathy  He was in his usual state of health this morning, had used Viagra around 9 AM.  Around 2 or 3 PM he began to have some left-sided tingling which then progressed to some discoordination on the left side and leaning towards the left.  Symptoms are improving at the time of my evaluation but not fully back to normal.  He denies any other focal neurological symptoms or systemic symptoms recently and reports no prior episodes of left-sided symptoms in the past  Regarding his CKD, he reports history of NSAID overuse which led to some decreased kidney function but reports his kidney function (GFR) has been stable in the mid to high 50s on regular checks more recently  LKW: 2 PM Thrombolytic given?: No, out of the window IR Thrombectomy? No, exam not c/w LVO Modified Rankin Scale: 0-Completely asymptomatic and back to baseline post- stroke Time of teleneurologist evaluation: 6:38 PM  Exam: Vitals:   03/01/23 1829 03/01/23 1829  BP:    Pulse: 70   Resp: 17   Temp: 98.5 F (36.9 C) 98.4 F (36.9 C)  SpO2: 100%     General: No acute distress Pulmonary: breathing comfortably  NIH Stroke scale 1A: Level of Consciousness - 0 1B: Ask Month and Age - 0 1C: 'Blink Eyes' & 'Squeeze  Hands' - 0 2: Test Horizontal Extraocular Movements - 0 3: Test Visual Fields - 0 4: Test Facial Palsy - 0 5A: Test Left Arm Motor Drift - 0 5B: Test Right Arm Motor Drift - 0 6A: Test Left Leg Motor Drift - 0 6B: Test Right Leg Motor Drift - 0 7: Test Limb Ataxia - 1 left upper extremity 8: Test Sensation - 1 left face numbness 9: Test Language/Aphasia- 0 10: Test Dysarthria - 0 11: Test Extinction/Inattention - 0 NIHSS score: 2   Imaging Reviewed:   Head CT personally reviewed, agree with radiology no evidence of acute intracranial process, though there is some chronic microvascular disease  Labs reviewed in epic and pertinent values follow:  Basic Metabolic Panel: Recent Labs  Lab 03/01/23 1831  NA 140  K 3.7  CL 103  CO2 24  GLUCOSE 84  BUN 15  CREATININE 1.34*  CALCIUM 9.8    CBC: Recent Labs  Lab 03/01/23 1831  WBC 11.0*  NEUTROABS 6.2  HGB 15.1  HCT 45.0  MCV 91.6  PLT 308    Coagulation Studies: Recent Labs    03/01/23 1831  LABPROT 13.7  INR 1.0       Assessment: Symptoms are concerning for stuttering lacunar stroke given age and risk factors.  Less likely cervical spine process with referring symptoms to face, but will let stroke team decide whether further imaging is needed after MRI brain is completed.  Recommendations:  # Right lacunar stroke/TIA - Stroke labs HgbA1c, fasting lipid panel - MRI brain  - CTA head/neck  given GFR stable in high 50s - Frequent neuro checks - Echocardiogram - Prophylactic therapy-Antiplatelet med: Aspirin - dose 325mg  PO or 300mg  PR, followed by 81 mg daily - Plavix 300 mg load with 75 mg daily for 21 - 90 day course to be determined based on vessel imaging - Risk factor modification - Telemetry monitoring - Blood pressure goal   - Permissive hypertension to 220/120 due to potential acute stroke, may gradually start to normalize blood pressure if MRI is negative or pending full stroke team evaluation -  PT consult, OT consult, Speech consult to be ordered if the patient seems substantially off baseline on full evaluation; minimal symptoms at the time of video evaluation and therefore these consultations were not ordered by myself - Stroke team to follow in consultation on admission to medicine at [facility] - Recommendations conveyed to ED provider via secure chat  This patient is receiving care for possible acute neurological changes. There was 30 minutes of care by this provider at the time of service, including time for direct evaluation via telemedicine, review of medical records, imaging studies and discussion of findings with providers, the patient and/or family.  Brooke Dare MD-PhD [facility] [phone]   If 8pm-8am, please page neurology on call as listed in AMION.  CRITICAL CARE Performed by: Gordy Councilman   Total critical care time: 30 minutes  Critical care time was exclusive of separately billable procedures and treating other patients.  Critical care was necessary to treat or prevent imminent or life-threatening deterioration, emergent evaluation for consideration of thrombectomy/thrombolytic.  Critical care was time spent personally by me on the following activities: development of treatment plan with patient and/or surrogate as well as nursing, discussions with consultants, evaluation of patient's response to treatment, examination of patient, obtaining history from patient or surrogate, ordering and performing treatments and interventions, ordering and review of laboratory studies, ordering and review of radiographic studies, pulse oximetry and re-evaluation of patient's condition.

## 2023-03-01 NOTE — ED Notes (Signed)
ED TO INPATIENT HANDOFF REPORT  ED Nurse Name and Phone #: Irene Limbo [phone]  S Name/Age/Gender David [name] 72 y.o. male Room/Bed: MH01/MH01  Code Status   Code Status: Prior  Home/SNF/Other Home Patient oriented to: self, place, time, and situation Is this baseline? Yes   Triage Complete: Triage complete  Chief Complaint left arm weakness  Triage Note The patient started having left arm and leg numbness around 3pm. He stated around 6pm he started having tingling to left side of face. Normal balance and speech according to wife.    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    OA (osteoarthritis) of knee    both   OSA on CPAP    Personal history of asthma    childhood   Right nephrolithiasis    non-obstuctive per ct 03-14-2015   Right ureteral stone    S/P ileostomy (HCC)    UC (ulcerative colitis) (HCC)    Past Surgical History:  Procedure Laterality Date   APPENDECTOMY  1981   CYSTO/  RIGHT RETROGRADE PYELOGRAM/  RIGHT STENT PLACEMENT  09-23-2006   KNEE ARTHROSCOPY Left 01/01/2015   Procedure: ARTHROSCOPY KNEE;  Surgeon: Salvatore Marvel, MD;  Location: [facility] OR;  Service: Orthopedics;  Laterality: Left;   PARTIAL NEPHRECTOMY Left 2003   TONSILLECTOMY  1990's   TOTAL KNEE ARTHROPLASTY Left 01/01/2015   Procedure: TOTAL KNEE ARTHROPLASTY;  Surgeon: Salvatore Marvel, MD;  Location: [facility]  OR;  Service: Orthopedics;  Laterality: Left;   TOTAL KNEE ARTHROPLASTY Right 02/12/2015   Procedure: TOTAL RIGHT KNEE ARTHROPLASTY;  Surgeon: Salvatore Marvel, MD;  Location: [facility] OR;  Service: Orthopedics;  Laterality: Right;   TOTAL PROCTOCOLECTOMY W/ END ILEOSTOMY  1981     A IV Location/Drains/Wounds Patient Lines/Drains/Airways Status     Active Line/Drains/Airways     Name Placement date Placement time Site Days   Peripheral IV 03/01/23 20 G Left Antecubital 03/01/23  1830  Antecubital  less than 1   Ileostomy Standard (end) RLQ --  --  RLQ  --            Intake/Output Last 24 hours No intake or output data in the 24 hours ending 03/01/23 2052  Labs/Imaging Results for orders placed or performed during the hospital encounter of 03/01/23 (from the past 48 hour(s))  CBG monitoring, ED     Status: None   Collection Time: 03/01/23  6:27 PM  Result Value Ref Range   Glucose-Capillary 81 70 - 99 mg/dL    Comment: Glucose reference range applies only to samples taken after fasting for at least 8 hours.  Ethanol     Status: None   Collection Time: 03/01/23  6:31 PM  Result Value Ref Range   Alcohol, Ethyl (B) <10 <10 mg/dL  Comment: (NOTE) Lowest detectable limit for serum alcohol is 10 mg/dL.  For medical purposes only. Performed at [facility], [address] [place], [place] [address]   Protime-INR     Status: None   Collection Time: 03/01/23  6:31 PM  Result Value Ref Range   Prothrombin Time 13.7 11.4 - 15.2 seconds   INR 1.0 0.8 - 1.2    Comment: (NOTE) INR goal varies based on device and disease states. Performed at [facility], [address] [place], [place] [address]   APTT     Status: None   Collection Time: 03/01/23  6:31 PM  Result Value Ref Range   aPTT 29 24 - 36 seconds    Comment: Performed at [facility], [address] [place], [place] [address]  CBC     Status: Abnormal   Collection Time: 03/01/23  6:31  PM  Result Value Ref Range   WBC 11.0 (H) 4.0 - 10.5 K/uL   RBC 4.91 4.22 - 5.81 MIL/uL   Hemoglobin 15.1 13.0 - 17.0 g/dL   HCT 91.4 78.2 - 95.6 %   MCV 91.6 80.0 - 100.0 fL   MCH 30.8 26.0 - 34.0 pg   MCHC 33.6 30.0 - 36.0 g/dL   RDW 21.3 08.6 - 57.8 %   Platelets 308 150 - 400 K/uL   nRBC 0.0 0.0 - 0.2 %    Comment: Performed at [facility], [address] [place], [place] [address]  Differential     Status: Abnormal   Collection Time: 03/01/23  6:31 PM  Result Value Ref Range   Neutrophils Relative % 57 %   Neutro Abs 6.2 1.7 - 7.7 K/uL   Lymphocytes Relative 27 %   Lymphs Abs 3.0 0.7 - 4.0 K/uL   Monocytes Relative 13 %   Monocytes Absolute 1.5 (H) 0.1 - 1.0 K/uL   Eosinophils Relative 2 %   Eosinophils Absolute 0.3 0.0 - 0.5 K/uL   Basophils Relative 1 %   Basophils Absolute 0.1 0.0 - 0.1 K/uL   Immature Granulocytes 0 %   Abs Immature Granulocytes 0.03 0.00 - 0.07 K/uL    Comment: Performed at [facility], [address] [place], [place] [address]  Comprehensive metabolic panel     Status: Abnormal   Collection Time: 03/01/23  6:31 PM  Result Value Ref Range   Sodium 140 135 - 145 mmol/L   Potassium 3.7 3.5 - 5.1 mmol/L   Chloride 103 98 - 111 mmol/L   CO2 24 22 - 32 mmol/L   Glucose, Bld 84 70 - 99 mg/dL    Comment: Glucose reference range applies only to samples taken after fasting for at least 8 hours.   BUN 15 8 - 23 mg/dL   Creatinine, Ser 1.32 (H) 0.61 - 1.24 mg/dL   Calcium 9.8 8.9 - 44.0 mg/dL   Total Protein 7.1 6.5 - 8.1 g/dL   Albumin 3.9 3.5 - 5.0 g/dL   AST 27 15 - 41 U/L   ALT 30 0 - 44 U/L   Alkaline Phosphatase 57 38 - 126 U/L   Total Bilirubin 0.9 <1.2 mg/dL   GFR, Estimated 57 (L) >60 mL/min    Comment: (NOTE) Calculated using the CKD-EPI Creatinine Equation (2021)    Anion gap 13 5 - 15    Comment: Performed at [facility], [address] [place],  [place] [address]  Urinalysis, Routine w reflex  microscopic -Urine, Clean Catch     Status: None   Collection Time: 03/01/23  8:25 PM  Result Value Ref Range   Color, Urine YELLOW YELLOW   APPearance CLEAR CLEAR   Specific Gravity, Urine <=1.005 1.005 - 1.030   pH 5.5 5.0 - 8.0   Glucose, UA NEGATIVE NEGATIVE mg/dL   Hgb urine dipstick NEGATIVE NEGATIVE   Bilirubin Urine NEGATIVE NEGATIVE   Ketones, ur NEGATIVE NEGATIVE mg/dL   Protein, ur NEGATIVE NEGATIVE mg/dL   Nitrite NEGATIVE NEGATIVE   Leukocytes,Ua NEGATIVE NEGATIVE    Comment: Microscopic not done on urines with negative protein, blood, leukocytes, nitrite, or glucose < 500 mg/dL. Performed at [facility], [address] [place], [place] [address]    CT ANGIO HEAD NECK W WO CM  Result Date: 03/01/2023 CLINICAL DATA:  Left arm and leg numbness, tingling to left side of face EXAM: CT ANGIOGRAPHY HEAD AND NECK WITH AND WITHOUT CONTRAST TECHNIQUE: Multidetector CT imaging of the head and neck was performed using the standard protocol during bolus administration of intravenous contrast. Multiplanar CT image reconstructions and MIPs were obtained to evaluate the vascular anatomy. Carotid stenosis measurements (when applicable) are obtained utilizing NASCET criteria, using the distal internal carotid diameter as the denominator. RADIATION DOSE REDUCTION: This exam was performed according to the departmental dose-optimization program which includes automated exposure control, adjustment of the mA and/or kV according to patient size and/or use of iterative reconstruction technique. CONTRAST:  75mL OMNIPAQUE IOHEXOL 350 MG/ML SOLN COMPARISON:  No prior CTA available, correlation is made with CT head 03/01/2023 FINDINGS: CT HEAD FINDINGS For noncontrast findings, please see same day CT head. CTA NECK FINDINGS Aortic arch: Standard branching. Imaged portion shows no evidence of aneurysm or dissection. No significant stenosis of the major arch vessel origins. Right carotid system: No  evidence of dissection, occlusion, or hemodynamically significant stenosis (greater than 50%). Left carotid system: No evidence of dissection, occlusion, or hemodynamically significant stenosis (greater than 50%). Vertebral arteries: No evidence of dissection, occlusion, or hemodynamically significant stenosis (greater than 50%). Skeleton: No acute osseous abnormality. Degenerative changes in the cervical spine. Other neck: No acute finding. Upper chest: No focal pulmonary opacity or pleural effusion. Review of the MIP images confirms the above findings CTA HEAD FINDINGS Anterior circulation: Both internal carotid arteries are patent to the termini, without significant stenosis. A1 segments patent. Normal anterior communicating artery. Anterior cerebral arteries are patent to their distal aspects without significant stenosis. No M1 stenosis or occlusion. MCA branches perfused to their distal aspects without significant stenosis. Posterior circulation: Vertebral arteries patent to the vertebrobasilar junction without significant stenosis. Posterior inferior cerebellar arteries patent proximally. Basilar patent to its distal aspect without significant stenosis. Superior cerebellar arteries patent proximally. Patent P1 segments. PCAs perfused to their distal aspects without significant stenosis. The bilateral posterior communicating arteries are not visualized. Venous sinuses: As permitted by contrast timing, patent. Anatomic variants: None significant. No evidence of aneurysm or vascular malformation. Review of the MIP images confirms the above findings IMPRESSION: 1. No intracranial large vessel occlusion or significant stenosis. 2. No hemodynamically significant stenosis in the neck. Electronically Signed   By: Wiliam Ke M.D.   On: 03/01/2023 20:40   CT HEAD CODE STROKE WO CONTRAST  Result Date: 03/01/2023 CLINICAL DATA:  Code stroke.  Left-sided weakness EXAM: CT HEAD WITHOUT CONTRAST TECHNIQUE: Contiguous  axial images were obtained from the base of the skull  through the vertex without intravenous contrast. RADIATION DOSE REDUCTION: This exam was performed according to the departmental dose-optimization program which includes automated exposure control, adjustment of the mA and/or kV according to patient size and/or use of iterative reconstruction technique. COMPARISON:  None Available. FINDINGS: Brain: No hemorrhage. No hydrocephalus. No extra-axial fluid collection. No mass effect. No mass lesion. CT evidence of an acute cortical infarct. Vascular: No hyperdense vessel or unexpected calcification. Skull: Normal. Negative for fracture or focal lesion. Sinuses/Orbits: No middle ear or mastoid effusion. Paranasal sinuses are clear. Orbits are unremarkable. Other: None. ASPECTS [facility] Stroke Program Early CT Score): 10 IMPRESSION: No hemorrhage or CT evidence of an acute cortical infarct. ASPECTS 10. Discussed with Dr. Suezanne Jacquet on 03/01/23 at 6:47 PM. Electronically Signed   By: Lorenza Cambridge M.D.   On: 03/01/2023 18:47    Pending Labs Unresulted Labs (From admission, onward)     Start     Ordered   03/02/23 0500  Lipid panel  (Labs)  Tomorrow morning,   R       Comments: Fasting    03/01/23 1918   03/01/23 1917  Hemoglobin A1c  (Labs)  Add-on,   AD       Comments: To assess prior glycemic control    03/01/23 1918   03/01/23 1831  Urine rapid drug screen (hosp performed)  Once,   STAT        03/01/23 1830            Vitals/Pain Today's Vitals   03/01/23 1827 03/01/23 1829 03/01/23 1829 03/01/23 2009  BP:      Pulse: 70 70    Resp: 16 17    Temp:  98.5 F (36.9 C) 98.4 F (36.9 C)   TempSrc:   Oral   SpO2: 100% 100%    PainSc:    1     Isolation Precautions No active isolations  Medications Medications   stroke: early stages of recovery book (has no administration in time range)  aspirin chewable tablet 81 mg (has no administration in time range)  clopidogrel (PLAVIX) tablet  75 mg (has no administration in time range)  clopidogrel (PLAVIX) tablet 300 mg (300 mg Oral Given 03/01/23 1952)  aspirin tablet 325 mg (325 mg Oral Given 03/01/23 1952)  iohexol (OMNIPAQUE) 350 MG/ML injection 75 mL (75 mLs Intravenous Contrast Given 03/01/23 1944)    Mobility walks with person assist     Focused Assessments Cardiac Assessment Handoff:   Does the Patient currently have chest pain? NO  , Neuro Assessment Handoff:  Swallow screen pass? Yes    NIH Stroke Scale  Dizziness Present: No Headache Present: Yes Interval: Neuro change Level of Consciousness (1a.)   : Alert, keenly responsive LOC Questions (1b. )   : Answers both questions correctly LOC Commands (1c. )   : Performs both tasks correctly Best Gaze (2. )  : Normal Visual (3. )  : No visual loss Facial Palsy (4. )    : Normal symmetrical movements Motor Arm, Left (5a. )   : No drift Motor Arm, Right (5b. ) : No drift Motor Leg, Left (6a. )  : No drift Motor Leg, Right (6b. ) : No drift Limb Ataxia (7. ): Absent Sensory (8. )  : Mild-to-moderate sensory loss, patient feels pinprick is less sharp or is dull on the affected side, or there is a loss of superficial pain with pinprick, but patient is aware of being touched Best Language (9. )  :  No aphasia Dysarthria (10. ): Normal Extinction/Inattention (11.)   : No Abnormality Complete NIHSS TOTAL: 1     Neuro Assessment: Exceptions to WDL Neuro Checks:   Initial (03/01/23 1820)  Has TPA been given? No If patient is a Neuro Trauma and patient is going to OR before floor call report to 4N Charge nurse: [phone] or [phone]   R Recommendations: See Admitting Provider Note  Report given to:   Additional Notes:

## 2023-03-01 NOTE — ED Triage Notes (Signed)
The patient started having left arm and leg numbness around 3pm. He stated around 6pm he started having tingling to left side of face. Normal balance and speech according to wife.

## 2023-03-01 NOTE — Progress Notes (Addendum)
Telestroke Note   1830: Code stroke cart activated by nursing at this time. EDP already assessed patient who presents to the ED with a LKW of 1400. Patient experienced sudden onset of left arm tingling, numbness and a headache. As the day progressed, patient endorses leaning to the left side, difficulty ambulating, and facial numbness present to the left side. EDP running code stroke per nursing. mRS 0.  1832: Dr. Selina Cooley paged.   1834: Dr. Iver Nestle paged. Patient arrived to CT.   1836: Dr. Iver Nestle on camera. Patient history and report provided to Dr. Iver Nestle.   1841: Dr. Iver Nestle performing neuro evatluation.   1853: Dr. Iver Nestle discussed NCCT imaging results with patient. Dr. Iver Nestle dismissed telestroke team at this time.   1854: Telestroke nurse logged off stroke cart per Dr. Rollene Fare request.     Derrill Kay Telestroke RN

## 2023-03-01 NOTE — ED Provider Notes (Signed)
[place] EMERGENCY DEPARTMENT AT [facility] Provider Note  CSN: [id number] Arrival date & time: 03/01/23 1815  Chief Complaint(s) Extremity Weakness  HPI David [name] is a 72 y.o. male history of ulcerative colitis, hypertension presenting to the emergency department with weakness.  Patient reports that around 3 PM while he was watching football, developed some sensation of left leg weakness, maybe some tingling in the left leg and left arm.  He reports when he was getting up he was falling over to the side.  He waited to see if it would get better but it did not.  He decided to drive to the emergency department.  On the way he developed some numbness to the left face.  Denies previous similar episode.  He reports that he has had a headache today but has a headache every time he takes Viagra and did take Viagra earlier this morning.  No fevers or chills, chest pain, abdominal pain, shortness of breath.   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    OA (osteoarthritis) of knee    both   OSA on CPAP    Personal history of asthma    childhood   Right nephrolithiasis    non-obstuctive per ct 03-14-2015   Right ureteral stone    S/P ileostomy (HCC)    UC (ulcerative colitis) (HCC)    Patient Active Problem List   Diagnosis Date Noted   Stroke-like symptoms 03/01/2023   Paresthesia 04/14/2019   Primary localized osteoarthritis of right knee    Obstructive sleep apnea 01/01/2015   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 Authorizing Provider  aspirin EC 81 MG tablet Take 81 mg by mouth daily.    [provider]  levothyroxine (SYNTHROID) 112 MCG tablet Take 112 mcg by mouth daily. 11/06/18   [provider]  Lidocaine 4 % GEL Apply 1 g topically 2 (two) times daily as needed. 02/15/19   York Spaniel, MD  lisinopril (ZESTRIL) 10 MG tablet Take 10 mg by mouth daily. 02/02/19   [provider]  mometasone (ELOCON) 0.1 % cream Apply 1 application topically daily.    [provider]  Multiple Vitamin (MULTIVITAMIN WITH MINERALS) TABS Take 1 tablet by mouth daily.    [provider]  tamsulosin (FLOMAX) 0.4 MG CAPS capsule Take 0.4 mg by mouth as needed.     [provider]  traZODone (DESYREL) 150 MG tablet Take 75 mg by mouth at bedtime.    [provider]                                                                                                                                    Past Surgical History Past Surgical History:  Procedure Laterality Date   APPENDECTOMY  1981   CYSTO/  RIGHT RETROGRADE PYELOGRAM/  RIGHT STENT PLACEMENT  09-23-2006   KNEE ARTHROSCOPY Left 01/01/2015   Procedure: ARTHROSCOPY KNEE;  Surgeon: Salvatore Marvel, MD;  Location: [facility] OR;  Service: Orthopedics;  Laterality: Left;   PARTIAL NEPHRECTOMY Left 2003   TONSILLECTOMY  1990's   TOTAL KNEE ARTHROPLASTY Left 01/01/2015   Procedure: TOTAL KNEE ARTHROPLASTY;  Surgeon: Salvatore Marvel,  MD;  Location: [facility] OR;  Service: Orthopedics;  Laterality: Left;   TOTAL KNEE ARTHROPLASTY Right 02/12/2015   Procedure: TOTAL RIGHT KNEE ARTHROPLASTY;  Surgeon: Salvatore Marvel, MD;  Location: [facility] OR;  Service: Orthopedics;  Laterality: Right;   TOTAL PROCTOCOLECTOMY W/ END ILEOSTOMY  1981   Family History Family History  Problem Relation Age of Onset   Cancer Father        thyroid   Prostate cancer Brother     Social History Social History   Tobacco Use   Smoking status: Never   Smokeless tobacco: Never  Substance Use Topics   Alcohol use: No   Drug use: No   Allergies Cymbalta [duloxetine hcl]  Review of Systems Review of Systems  All other systems reviewed and are negative.   Physical Exam Vital Signs  I have reviewed the triage vital signs BP 116/80   Pulse 65   Temp 97.9 F (36.6 C) (Oral)   Resp 18   SpO2 98%  Physical Exam Vitals and nursing note reviewed.  Constitutional:      General: He is not in acute distress.    Appearance: Normal appearance.  HENT:     Mouth/Throat:     Mouth: Mucous membranes are moist.  Eyes:     Conjunctiva/sclera: Conjunctivae normal.  Cardiovascular:     Rate and Rhythm: Normal rate and regular rhythm.  Pulmonary:     Effort: Pulmonary effort is normal. No respiratory distress.     Breath sounds: Normal breath sounds.  Abdominal:     General: Abdomen is flat.     Palpations: Abdomen is soft.     Tenderness: There is no abdominal tenderness.  Musculoskeletal:     Right lower leg: No edema.     Left lower leg: No edema.  Skin:    General: Skin is warm and dry.     Capillary Refill: Capillary refill takes less than 2 seconds.  Neurological:     Mental Status: He is alert.     Comments: Cranial nerves II through XII intact with the exception of very slight flattening of the nasolabial fold on the left with subjective numbness.  Strength 5 out of 5 in bilateral upper and lower extremities,  no sensory deficit to light touch   Psychiatric:        Mood and Affect: Mood normal.        Behavior: Behavior normal.     ED Results and Treatments Labs (all labs ordered are listed, but only abnormal results are displayed) Labs Reviewed  CBC - Abnormal; Notable for the following components:      Result Value   WBC 11.0 (*)    All other components within normal limits  DIFFERENTIAL - Abnormal; Notable for the following components:   Monocytes Absolute 1.5 (*)    All other components within normal limits  COMPREHENSIVE METABOLIC PANEL - Abnormal; Notable for the following components:   Creatinine, Ser 1.34 (*)    GFR, Estimated 57 (*)    All other components within normal limits  ETHANOL  PROTIME-INR  APTT  RAPID URINE DRUG SCREEN, HOSP PERFORMED  URINALYSIS, ROUTINE W REFLEX MICROSCOPIC  LIPID PANEL  HEMOGLOBIN A1C  CBG MONITORING, ED                                                                                                                          Radiology CT ANGIO HEAD NECK W WO CM  Result Date: 03/01/2023 CLINICAL DATA:  Left arm and leg numbness, tingling to left side of face EXAM: CT ANGIOGRAPHY HEAD AND NECK WITH AND WITHOUT CONTRAST TECHNIQUE: Multidetector CT imaging of the head and neck was performed using the standard protocol during bolus administration of intravenous contrast. Multiplanar CT image reconstructions and MIPs were obtained to evaluate the vascular anatomy. Carotid stenosis measurements (when applicable) are obtained utilizing NASCET criteria, using the distal internal carotid diameter as the denominator. RADIATION DOSE REDUCTION: This exam was performed according to the departmental dose-optimization program which includes automated exposure control, adjustment of the mA and/or kV according to patient size and/or use of iterative reconstruction technique. CONTRAST:  75mL OMNIPAQUE IOHEXOL 350 MG/ML SOLN COMPARISON:  No prior CTA available, correlation is made with CT head 03/01/2023  FINDINGS: CT HEAD FINDINGS For noncontrast findings, please see same day CT head. CTA NECK FINDINGS Aortic arch: Standard branching. Imaged portion shows no evidence of aneurysm or dissection. No significant stenosis of the major arch vessel origins. Right carotid system: No evidence of dissection, occlusion, or hemodynamically significant stenosis (greater than 50%). Left carotid system: No evidence of dissection, occlusion, or hemodynamically significant stenosis (greater than 50%). Vertebral arteries: No evidence of dissection, occlusion, or hemodynamically significant stenosis (greater than 50%). Skeleton: No acute osseous abnormality. Degenerative changes in the cervical spine. Other neck: No acute finding. Upper chest: No focal pulmonary opacity or pleural effusion. Review of the MIP images confirms the above findings CTA HEAD FINDINGS Anterior circulation: Both internal carotid arteries are patent to the termini, without significant stenosis. A1 segments patent. Normal anterior communicating artery. Anterior cerebral arteries are patent to their distal aspects without significant stenosis. No M1 stenosis or occlusion. MCA branches perfused to their distal aspects without  significant stenosis. Posterior circulation: Vertebral arteries patent to the vertebrobasilar junction without significant stenosis. Posterior inferior cerebellar arteries patent proximally. Basilar patent to its distal aspect without significant stenosis. Superior cerebellar arteries patent proximally. Patent P1 segments. PCAs perfused to their distal aspects without significant stenosis. The bilateral posterior communicating arteries are not visualized. Venous sinuses: As permitted by contrast timing, patent. Anatomic variants: None significant. No evidence of aneurysm or vascular malformation. Review of the MIP images confirms the above findings IMPRESSION: 1. No intracranial large vessel occlusion or significant stenosis. 2. No  hemodynamically significant stenosis in the neck. Electronically Signed   By: Wiliam Ke M.D.   On: 03/01/2023 20:40   CT HEAD CODE STROKE WO CONTRAST  Result Date: 03/01/2023 CLINICAL DATA:  Code stroke.  Left-sided weakness EXAM: CT HEAD WITHOUT CONTRAST TECHNIQUE: Contiguous axial images were obtained from the base of the skull through the vertex without intravenous contrast. RADIATION DOSE REDUCTION: This exam was performed according to the departmental dose-optimization program which includes automated exposure control, adjustment of the mA and/or kV according to patient size and/or use of iterative reconstruction technique. COMPARISON:  None Available. FINDINGS: Brain: No hemorrhage. No hydrocephalus. No extra-axial fluid collection. No mass effect. No mass lesion. CT evidence of an acute cortical infarct. Vascular: No hyperdense vessel or unexpected calcification. Skull: Normal. Negative for fracture or focal lesion. Sinuses/Orbits: No middle ear or mastoid effusion. Paranasal sinuses are clear. Orbits are unremarkable. Other: None. ASPECTS [facility] Stroke Program Early CT Score): 10 IMPRESSION: No hemorrhage or CT evidence of an acute cortical infarct. ASPECTS 10. Discussed with Dr. Suezanne Jacquet on 03/01/23 at 6:47 PM. Electronically Signed   By: Lorenza Cambridge M.D.   On: 03/01/2023 18:47    Pertinent labs & imaging results that were available during my care of the patient were reviewed by me and considered in my medical decision making (see MDM for details).  Medications Ordered in ED Medications   stroke: early stages of recovery book (has no administration in time range)  aspirin chewable tablet 81 mg (has no administration in time range)  clopidogrel (PLAVIX) tablet 75 mg (has no administration in time range)  clopidogrel (PLAVIX) tablet 300 mg (300 mg Oral Given 03/01/23 1952)  aspirin tablet 325 mg (325 mg Oral Given 03/01/23 1952)  iohexol (OMNIPAQUE) 350 MG/ML injection 75 mL (75 mLs  Intravenous Contrast Given 03/01/23 1944)                                                                                                                                     Procedures .Critical Care  Performed by: Lonell Grandchild, MD Authorized by: Lonell Grandchild, MD   Critical care provider statement:    Critical care time (minutes):  30   Critical care was necessary to treat or prevent imminent or life-threatening deterioration of the following conditions:  CNS failure or compromise   Critical care was  time spent personally by me on the following activities:  Development of treatment plan with patient or surrogate, discussions with consultants, evaluation of patient's response to treatment, examination of patient, ordering and review of laboratory studies, ordering and review of radiographic studies, ordering and performing treatments and interventions, pulse oximetry, re-evaluation of patient's condition and review of old charts   Care discussed with: admitting provider     (including critical care time)  Medical Decision Making / ED Course   MDM:  72 year old presenting to the emergency department with numbness, weakness.  Patient overall well-appearing, does have maybe some slight left facial weakness.  Symptoms began 3.5 hours ago so code stroke has been activated.  He also has a headache so CT to evaluate for any intracranial bleeding.  Teleneurology will be activated.  Low concern for stroke mimic will check labs, glucose.  No chest pain to suggest dissection. Doubt large vessel infarct   Clinical Course as of 03/01/23 2125  Wynelle Link Mar 01, 2023  2122 Discussed with Dr. Iver Nestle, patient symptoms concerning for stroke.  Although he told me 3 PM, to her he said he is not sure and it could have been at 2 PM so he was out of the window for TNK.  He will be admitted to [facility] for further stroke workup.  This was discussed with Dr. Loney Loh who has place admission  orders. [WS]    Clinical Course User Index [WS] Lonell Grandchild, MD     Additional history obtained: -Additional history obtained from wife  -External records from outside source obtained and reviewed including: Chart review including previous notes, labs, imaging, consultation notes including prior notes    Lab Tests: -I ordered, reviewed, and interpreted labs.   The pertinent results include:   Labs Reviewed  CBC - Abnormal; Notable for the following components:      Result Value   WBC 11.0 (*)    All other components within normal limits  DIFFERENTIAL - Abnormal; Notable for the following components:   Monocytes Absolute 1.5 (*)    All other components within normal limits  COMPREHENSIVE METABOLIC PANEL - Abnormal; Notable for the following components:   Creatinine, Ser 1.34 (*)    GFR, Estimated 57 (*)    All other components within normal limits  ETHANOL  PROTIME-INR  APTT  RAPID URINE DRUG SCREEN, HOSP PERFORMED  URINALYSIS, ROUTINE W REFLEX MICROSCOPIC  LIPID PANEL  HEMOGLOBIN A1C  CBG MONITORING, ED    Notable for nonspecific mild leukocytosis, aki   EKG   EKG Interpretation Date/Time:  Sunday March 01 2023 18:24:25 EST Ventricular Rate:  74 PR Interval:  165 QRS Duration:  114 QT Interval:  380 QTC Calculation: 422 R Axis:   -13  Text Interpretation: Sinus rhythm Incomplete RBBB and LAFB Low voltage, precordial leads Confirmed by Alvino Blood ([id number]) on 03/01/2023 8:23:17 PM         Imaging Studies ordered: I ordered imaging studies including CT head On my interpretation imaging demonstrates no bleeding I independently visualized and interpreted imaging. I agree with the radiologist interpretation   Medicines ordered and prescription drug management: Meds ordered this encounter  Medications    stroke: early stages of recovery book   clopidogrel (PLAVIX) tablet 300 mg   aspirin tablet 325 mg   aspirin chewable tablet 81 mg    clopidogrel (PLAVIX) tablet 75 mg   iohexol (OMNIPAQUE) 350 MG/ML injection 75 mL    -I have reviewed the patients home  medicines and have made adjustments as needed   Consultations Obtained: I requested consultation with the neurologist,  and discussed lab and imaging findings as well as pertinent plan - they recommend: admission   Cardiac Monitoring: The patient was maintained on a cardiac monitor.  I personally viewed and interpreted the cardiac monitored which showed an underlying rhythm of: NSR    Reevaluation: After the interventions noted above, I reevaluated the patient and found that their symptoms have improved  Co morbidities that complicate the patient evaluation  Past Medical History:  Diagnosis Date   Anxiety    FH: kidney cancer    GERD (gastroesophageal reflux disease)    Graves disease    History of kidney stones    History of renal cell carcinoma    s/p  left partial nephrectomy   History of small bowel obstruction    recurrent   History of ulcerative colitis    s/p total colectomy 1981   Hypertension    Hypothyroidism    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    Final Clinical Impression(s) / ED Diagnoses Final diagnoses:  Stroke-like symptom     This chart was dictated using voice recognition software.  Despite best efforts to proofread,  errors can occur which can change the documentation meaning.    Lonell Grandchild, MD 03/01/23 2125

## 2023-03-01 NOTE — ED Notes (Signed)
MD called code stroke

## 2023-03-01 NOTE — Plan of Care (Signed)
Plan of Care Note for accepted transfer   Patient name: David [name] WUJ:[id number] DOB: 1950/12/28  Facility requesting transfer: [facility] ED Requesting Provider: Dr. Suezanne Jacquet Facility course: 73 year old male with history of hypertension, hypothyroidism, renal cell carcinoma status post left partial nephrectomy, ulcerative colitis status post total colectomy and ileostomy, OSA on CPAP, CKD stage IIIa, peripheral neuropathy presented ED with complaint of left-sided tingling around 2 or 3 PM today which progressed to discoordination on the left side and leaning toward the left.  He used Viagra around 9 AM.  CT head negative for acute intracranial abnormality.  CTA head and neck negative for LVO or significant stenosis.  No significant lab abnormalities. Neurology recommended brain MRI and admission for stroke versus TIA workup.  Patient was given aspirin and Plavix.  Plan of care: The patient is accepted for admission to Telemetry unit at [facility].  [facility] will assume care on arrival to accepting facility. Until arrival, care as per EDP. However, [facility] available 24/7 for questions and assistance.  Check [email or web address] for on-call coverage.  Nursing staff, please call [facility] Admits & Consults System-Wide number under Amion on patient's arrival so appropriate admitting provider can evaluate the pt.

## 2023-03-01 NOTE — ED Notes (Signed)
Provider at bedside

## 2023-03-02 ENCOUNTER — Emergency Department (HOSPITAL_BASED_OUTPATIENT_CLINIC_OR_DEPARTMENT_OTHER): Payer: Medicare HMO

## 2023-03-02 ENCOUNTER — Observation Stay (HOSPITAL_COMMUNITY): Payer: Medicare HMO

## 2023-03-02 DIAGNOSIS — I639 Cerebral infarction, unspecified: Secondary | ICD-10-CM

## 2023-03-02 DIAGNOSIS — Z79899 Other long term (current) drug therapy: Secondary | ICD-10-CM | POA: Diagnosis not present

## 2023-03-02 DIAGNOSIS — G459 Transient cerebral ischemic attack, unspecified: Secondary | ICD-10-CM | POA: Diagnosis present

## 2023-03-02 DIAGNOSIS — I6782 Cerebral ischemia: Secondary | ICD-10-CM | POA: Diagnosis not present

## 2023-03-02 DIAGNOSIS — Z96653 Presence of artificial knee joint, bilateral: Secondary | ICD-10-CM | POA: Diagnosis not present

## 2023-03-02 DIAGNOSIS — R2990 Unspecified symptoms and signs involving the nervous system: Secondary | ICD-10-CM

## 2023-03-02 DIAGNOSIS — E291 Testicular hypofunction: Secondary | ICD-10-CM | POA: Diagnosis not present

## 2023-03-02 DIAGNOSIS — I951 Orthostatic hypotension: Secondary | ICD-10-CM | POA: Diagnosis not present

## 2023-03-02 DIAGNOSIS — Z7982 Long term (current) use of aspirin: Secondary | ICD-10-CM | POA: Diagnosis not present

## 2023-03-02 DIAGNOSIS — Z932 Ileostomy status: Secondary | ICD-10-CM | POA: Diagnosis not present

## 2023-03-02 DIAGNOSIS — I6389 Other cerebral infarction: Secondary | ICD-10-CM | POA: Diagnosis not present

## 2023-03-02 DIAGNOSIS — R9089 Other abnormal findings on diagnostic imaging of central nervous system: Secondary | ICD-10-CM | POA: Diagnosis not present

## 2023-03-02 DIAGNOSIS — I6381 Other cerebral infarction due to occlusion or stenosis of small artery: Secondary | ICD-10-CM | POA: Diagnosis not present

## 2023-03-02 DIAGNOSIS — I129 Hypertensive chronic kidney disease with stage 1 through stage 4 chronic kidney disease, or unspecified chronic kidney disease: Secondary | ICD-10-CM | POA: Diagnosis not present

## 2023-03-02 DIAGNOSIS — R202 Paresthesia of skin: Secondary | ICD-10-CM | POA: Diagnosis not present

## 2023-03-02 DIAGNOSIS — N1831 Chronic kidney disease, stage 3a: Secondary | ICD-10-CM | POA: Diagnosis not present

## 2023-03-02 DIAGNOSIS — J45909 Unspecified asthma, uncomplicated: Secondary | ICD-10-CM | POA: Diagnosis not present

## 2023-03-02 DIAGNOSIS — E039 Hypothyroidism, unspecified: Secondary | ICD-10-CM | POA: Diagnosis not present

## 2023-03-02 DIAGNOSIS — Z85528 Personal history of other malignant neoplasm of kidney: Secondary | ICD-10-CM | POA: Diagnosis not present

## 2023-03-02 DIAGNOSIS — G319 Degenerative disease of nervous system, unspecified: Secondary | ICD-10-CM | POA: Diagnosis not present

## 2023-03-02 LAB — TSH: TSH: 0.786 u[IU]/mL (ref 0.350–4.500)

## 2023-03-02 LAB — ECHOCARDIOGRAM COMPLETE
AR max vel: 3.14 cm2
AV Peak grad: 7.2 mm[Hg]
Ao pk vel: 1.34 m/s
Area-P 1/2: 2.8 cm2
S' Lateral: 2.6 cm

## 2023-03-02 LAB — HIV ANTIBODY (ROUTINE TESTING W REFLEX): HIV Screen 4th Generation wRfx: NONREACTIVE

## 2023-03-02 LAB — GLUCOSE, CAPILLARY: Glucose-Capillary: 91 mg/dL (ref 70–99)

## 2023-03-02 MED ORDER — CLOPIDOGREL BISULFATE 75 MG PO TABS: 75.0000 mg | ORAL_TABLET | Freq: Every day | ORAL | 0 refills | Status: AC

## 2023-03-02 MED ORDER — ROSUVASTATIN CALCIUM 10 MG PO TABS: 10.0000 mg | ORAL_TABLET | Freq: Every day | ORAL | 2 refills | Status: DC

## 2023-03-02 MED ORDER — ACETAMINOPHEN 160 MG/5ML PO SOLN: 650.0000 mg | ORAL | Status: DC | PRN

## 2023-03-02 MED ORDER — ENOXAPARIN SODIUM 40 MG/0.4ML IJ SOSY
40.0000 mg | PREFILLED_SYRINGE | INTRAMUSCULAR | Status: DC
  Administered 2023-03-02: 40 mg via SUBCUTANEOUS
  Filled 2023-03-02: qty 0.4

## 2023-03-02 MED ORDER — ROSUVASTATIN CALCIUM 5 MG PO TABS
10.0000 mg | ORAL_TABLET | Freq: Every day | ORAL | Status: DC
  Administered 2023-03-03: 10 mg via ORAL
  Filled 2023-03-02: qty 2

## 2023-03-02 MED ORDER — SODIUM CHLORIDE 0.9 % IV BOLUS
1000.0000 mL | Freq: Once | INTRAVENOUS | Status: AC
  Administered 2023-03-02: 1000 mL via INTRAVENOUS

## 2023-03-02 MED ORDER — SODIUM CHLORIDE 0.9 % IV SOLN: INTRAVENOUS | Status: DC

## 2023-03-02 MED ORDER — LEVOTHYROXINE SODIUM 100 MCG PO TABS
100.0000 ug | ORAL_TABLET | Freq: Every day | ORAL | Status: DC
  Administered 2023-03-03: 100 ug via ORAL
  Filled 2023-03-02: qty 1

## 2023-03-02 MED ORDER — ASPIRIN 81 MG PO CHEW: 81.0000 mg | CHEWABLE_TABLET | Freq: Every day | ORAL | 2 refills | Status: AC

## 2023-03-02 MED ORDER — ACETAMINOPHEN 325 MG PO TABS
650.0000 mg | ORAL_TABLET | ORAL | Status: DC | PRN
  Administered 2023-03-02: 650 mg via ORAL
  Filled 2023-03-02: qty 2

## 2023-03-02 MED ORDER — ACETAMINOPHEN 650 MG RE SUPP: 650.0000 mg | RECTAL | Status: DC | PRN

## 2023-03-02 MED ORDER — ROSUVASTATIN CALCIUM 5 MG PO TABS: 10.0000 mg | ORAL_TABLET | Freq: Every day | ORAL | Status: DC

## 2023-03-02 NOTE — Progress Notes (Signed)
Mobility Specialist Progress Note;   03/02/23 1450  Mobility  Activity Ambulated independently to bathroom  Level of Assistance Modified independent, requires aide device or extra time  Assistive Device None  Distance Ambulated (ft) 15 ft  Activity Response Tolerated well  Mobility Referral Yes  $Mobility charge 1 Mobility  Mobility Specialist Start Time (ACUTE ONLY) 1450  Mobility Specialist Stop Time (ACUTE ONLY) 1455  Mobility Specialist Time Calculation (min) (ACUTE ONLY) 5 min   Answered pts call bell for assistance to BR. Required modI assistance in room. No c/o while in room. Pt left in bathroom with all needs met.   Caesar Bookman Mobility Specialist Please contact via SecureChat or Rehab Office [phone]

## 2023-03-02 NOTE — Progress Notes (Signed)
Pt has been taken down to MRI . Vitals stable , pt has no concerns at this time.

## 2023-03-02 NOTE — Evaluation (Signed)
Physical Therapy Evaluation - One-time eval and sign off  Patient Details Name: David [name] MRN: [id number] DOB: 06-Feb-1951 Today's Date: 03/02/2023  History of Present Illness  Pt is a 72 y/o M presenting to ED on 11/24 with LUE/LLE and L facial numbness, MRI with 4mm acute infarct in R caudate nucleus/corona radiata, small chronic lacunar infarct in R lentiform nucleus. PMH includes ulcerative colitis, anxiety, GERD, graves disease, SBO, HTN, hypothyroidism, ileostomy.  Clinical Impression   Pt presents with [facility] strength, balance (21/24 on DGI not at increased risk of falls), and activity tolerance. Pt ambulated good hallway distance with mod I for slightly increased time, no physical assist or evidence of imbalance during assessment. PT educated pt on BE FAST principles, pt expresses understanding. Pt at baseline, no further acute or post-acute PT needs at this time, PT to sign off.            If plan is discharge home, recommend the following:     Can travel by private vehicle        Equipment Recommendations None recommended by PT  Recommendations for Other Services       Functional Status Assessment Patient has not had a recent decline in their functional status     Precautions / Restrictions Precautions Precautions: None Restrictions Weight Bearing Restrictions: No      Mobility  Bed Mobility Overal bed mobility: Independent                  Transfers Overall transfer level: Independent                      Ambulation/Gait Ambulation/Gait assistance: Modified independent (Device/Increase time) Gait Distance (Feet): 350 Feet Assistive device: None Gait Pattern/deviations: Step-through pattern, WFL(Within Functional Limits)       General Gait Details: slightly slowed speed, increasing to normal gait speed with no evidence of imbalance  Stairs Stairs: Yes Stairs assistance: Modified independent (Device/Increase time) Stair Management:  One rail Right, Alternating pattern, Forwards Number of Stairs: 12 General stair comments: use of railing for stability, no evidence of imbalance  Wheelchair Mobility     Tilt Bed    Modified Rankin (Stroke Patients Only) Modified Rankin (Stroke Patients Only) Pre-Morbid Rankin Score: No symptoms Modified Rankin: No symptoms     Balance Overall balance assessment: Modified Independent                               Standardized Balance Assessment Standardized Balance Assessment : Dynamic Gait Index   Dynamic Gait Index Level Surface: Normal Change in Gait Speed: Normal Gait with Horizontal Head Turns: Mild Impairment Gait with Vertical Head Turns: Mild Impairment Gait and Pivot Turn: Normal Step Over Obstacle: Normal Step Around Obstacles: Normal Steps: Mild Impairment Total Score: 21       Pertinent Vitals/Pain Pain Assessment Pain Assessment: No/denies pain    Home Living Family/patient expects to be discharged to:: Private residence Living Arrangements: Spouse/significant other Available Help at Discharge: Family;Available 24 hours/day Type of Home: House Home Access: Stairs to enter   Entrance Stairs-Number of Steps: 1 Alternate Level Stairs-Number of Steps: flight Home Layout: Two level;Able to live on main level with bedroom/bathroom Home Equipment: Rolling Walker (2 wheels);BSC/3in1;Shower seat;Cane - single point      Prior Function Prior Level of Function : Independent/Modified Independent             Mobility Comments: no  AD use, plays pickleball/golf ADLs Comments: ind     Extremity/Trunk Assessment   Upper Extremity Assessment Upper Extremity Assessment: Defer to OT evaluation    Lower Extremity Assessment Lower Extremity Assessment: Overall WFL for tasks assessed (denies numbness beyond baseline neuropathy)    Cervical / Trunk Assessment Cervical / Trunk Assessment: Normal  Communication    Communication Communication: No apparent difficulties  Cognition Arousal: Alert Behavior During Therapy: WFL for tasks assessed/performed Overall Cognitive Status: Within Functional Limits for tasks assessed                                          General Comments General comments (skin integrity, edema, etc.): VSS on RA    Exercises     Assessment/Plan    PT Assessment Patient does not need any further PT services  PT Problem List         PT Treatment Interventions      PT Goals (Current goals can be found in the Care Plan section)  Acute Rehab PT Goals Patient Stated Goal: home asap PT Goal Formulation: With patient Time For Goal Achievement: 03/16/23 Potential to Achieve Goals: Good    Frequency       Co-evaluation               AM-PAC PT "6 Clicks" Mobility  Outcome Measure Help needed turning from your back to your side while in a flat bed without using bedrails?: None Help needed moving from lying on your back to sitting on the side of a flat bed without using bedrails?: None Help needed moving to and from a bed to a chair (including a wheelchair)?: None Help needed standing up from a chair using your arms (e.g., wheelchair or bedside chair)?: None Help needed to walk in hospital room?: None Help needed climbing 3-5 steps with a railing? : A Little 6 Click Score: 23    End of Session   Activity Tolerance: Patient tolerated treatment well Patient left: in bed;with call bell/phone within reach Nurse Communication: Mobility status PT Visit Diagnosis: Other abnormalities of gait and mobility (R26.89)    Time: 1537-1550 PT Time Calculation (min) (ACUTE ONLY): 13 min   Charges:   PT Evaluation $PT Eval Low Complexity: 1 Low   PT General Charges $$ ACUTE PT VISIT: 1 Visit         Marye Round, PT DPT Acute Rehabilitation Services Secure Chat Preferred  Office [phone]   Kendre Sires Sheliah Plane 03/02/2023, 4:15 PM

## 2023-03-02 NOTE — ED Notes (Signed)
Called [phone] . Informed receiving nurse in report. Will call back in 10 min.

## 2023-03-02 NOTE — ED Notes (Signed)
ED TO INPATIENT HANDOFF REPORT  ED Nurse Name and Phone #: Hayden Pedro RN [phone]  S Name/Age/Gender David [name] 72 y.o. male Room/Bed: MH01/MH01  Code Status   Code Status: Prior  Home/SNF/Other Home Patient oriented to: self, place, time, and situation Is this baseline? Yes   Triage Complete: Triage complete  Chief Complaint Stroke-like symptoms [R29.90]  Triage Note The patient started having left arm and leg numbness around 3pm. He stated around 6pm he started having tingling to left side of face. Normal balance and speech according to wife.    Allergies Allergies  Allergen Reactions   Cymbalta [Duloxetine Hcl]     Impotence    Level of Care/Admitting Diagnosis ED Disposition     ED Disposition  Admit   Condition  --   Comment  Hospital Area: [facility] [[id number]]  Level of Care: Telemetry Medical [104]  Interfacility transfer: Yes  May place patient in observation at [facility] or [facility] if equivalent level of care is available:: Yes  Covid Evaluation: Asymptomatic - no recent exposure (last 10 days) testing not required  Diagnosis: Stroke-like symptoms [725552]  Admitting Physician: Magnus Ivan [[id number]]  Attending Physician: Lonell Grandchild [phone]          B Medical/Surgery History Past Medical History:  Diagnosis Date   Anxiety    FH: kidney cancer    GERD (gastroesophageal reflux disease)    Graves disease    History of kidney stones    History of renal cell carcinoma    s/p  left partial nephrectomy   History of small bowel obstruction    recurrent   History of ulcerative colitis    s/p total colectomy 1981   Hypertension    Hypothyroidism    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    OA (osteoarthritis) of knee    both   OSA on CPAP    Personal history of asthma    childhood   Right nephrolithiasis    non-obstuctive per ct 03-14-2015   Right ureteral stone    S/P  ileostomy (HCC)    UC (ulcerative colitis) (HCC)    Past Surgical History:  Procedure Laterality Date   APPENDECTOMY  1981   CYSTO/  RIGHT RETROGRADE PYELOGRAM/  RIGHT STENT PLACEMENT  09-23-2006   KNEE ARTHROSCOPY Left 01/01/2015   Procedure: ARTHROSCOPY KNEE;  Surgeon: Salvatore Marvel, MD;  Location: [facility] OR;  Service: Orthopedics;  Laterality: Left;   PARTIAL NEPHRECTOMY Left 2003   TONSILLECTOMY  1990's   TOTAL KNEE ARTHROPLASTY Left 01/01/2015   Procedure: TOTAL KNEE ARTHROPLASTY;  Surgeon: Salvatore Marvel, MD;  Location: [facility] OR;  Service: Orthopedics;  Laterality: Left;   TOTAL KNEE ARTHROPLASTY Right 02/12/2015   Procedure: TOTAL RIGHT KNEE ARTHROPLASTY;  Surgeon: Salvatore Marvel, MD;  Location: [facility] OR;  Service: Orthopedics;  Laterality: Right;   TOTAL PROCTOCOLECTOMY W/ END ILEOSTOMY  1981     A IV Location/Drains/Wounds Patient Lines/Drains/Airways Status     Active Line/Drains/Airways     Name Placement date Placement time Site Days   Peripheral IV 03/01/23 20 G Left Antecubital 03/01/23  1830  Antecubital  1   Ileostomy Standard (end) RLQ --  --  RLQ  --            Intake/Output Last 24 hours  Intake/Output Summary (Last 24 hours) at 03/02/2023 0712 Last data filed at 03/02/2023 5366 Gross per 24 hour  Intake 120 ml  Output 850 ml  Net -730  ml    Labs/Imaging Results for orders placed or performed during the hospital encounter of 03/01/23 (from the past 48 hour(s))  CBG monitoring, ED     Status: None   Collection Time: 03/01/23  6:27 PM  Result Value Ref Range   Glucose-Capillary 81 70 - 99 mg/dL    Comment: Glucose reference range applies only to samples taken after fasting for at least 8 hours.  Ethanol     Status: None   Collection Time: 03/01/23  6:31 PM  Result Value Ref Range   Alcohol, Ethyl (B) <10 <10 mg/dL    Comment: (NOTE) Lowest detectable limit for serum alcohol is 10 mg/dL.  For medical purposes only. Performed at [facility], [address] [place], [place] [address]   Protime-INR     Status: None   Collection Time: 03/01/23  6:31 PM  Result Value Ref Range   Prothrombin Time 13.7 11.4 - 15.2 seconds   INR 1.0 0.8 - 1.2    Comment: (NOTE) INR goal varies based on device and disease states. Performed at [facility], [address] [place], [place] [address]   APTT     Status: None   Collection Time: 03/01/23  6:31 PM  Result Value Ref Range   aPTT 29 24 - 36 seconds    Comment: Performed at [facility], [address] [place], [place] [address]  CBC     Status: Abnormal   Collection Time: 03/01/23  6:31 PM  Result Value Ref Range   WBC 11.0 (H) 4.0 - 10.5 K/uL   RBC 4.91 4.22 - 5.81 MIL/uL   Hemoglobin 15.1 13.0 - 17.0 g/dL   HCT 93.8 18.2 - 99.3 %   MCV 91.6 80.0 - 100.0 fL   MCH 30.8 26.0 - 34.0 pg   MCHC 33.6 30.0 - 36.0 g/dL   RDW 71.6 96.7 - 89.3 %   Platelets 308 150 - 400 K/uL   nRBC 0.0 0.0 - 0.2 %    Comment: Performed at [facility], [address] [place], [place] [address]  Differential     Status: Abnormal   Collection Time: 03/01/23  6:31 PM  Result Value Ref Range   Neutrophils Relative % 57 %   Neutro Abs 6.2 1.7 - 7.7 K/uL   Lymphocytes Relative 27 %   Lymphs Abs 3.0 0.7 - 4.0 K/uL   Monocytes Relative 13 %   Monocytes Absolute 1.5 (H) 0.1 - 1.0 K/uL   Eosinophils Relative 2 %   Eosinophils Absolute 0.3 0.0 - 0.5 K/uL   Basophils Relative 1 %   Basophils Absolute 0.1 0.0 - 0.1 K/uL   Immature Granulocytes 0 %   Abs Immature Granulocytes 0.03 0.00 - 0.07 K/uL    Comment: Performed at [facility], [address] [place], [place] [address]  Comprehensive metabolic panel     Status: Abnormal   Collection Time: 03/01/23  6:31 PM  Result Value Ref Range   Sodium 140 135 - 145 mmol/L   Potassium 3.7 3.5 - 5.1 mmol/L   Chloride 103 98 - 111 mmol/L   CO2 24 22 - 32 mmol/L   Glucose, Bld 84 70 - 99 mg/dL    Comment: Glucose  reference range applies only to samples taken after fasting for at least 8 hours.   BUN 15 8 - 23 mg/dL   Creatinine, Ser 8.52 (H) 0.61 -  1.24 mg/dL   Calcium 9.8 8.9 - 16.1 mg/dL   Total Protein 7.1 6.5 - 8.1 g/dL   Albumin 3.9 3.5 - 5.0 g/dL   AST 27 15 - 41 U/L   ALT 30 0 - 44 U/L   Alkaline Phosphatase 57 38 - 126 U/L   Total Bilirubin 0.9 <1.2 mg/dL   GFR, Estimated 57 (L) >60 mL/min    Comment: (NOTE) Calculated using the CKD-EPI Creatinine Equation (2021)    Anion gap 13 5 - 15    Comment: Performed at [facility], [address] [place], [place] [address]  Hemoglobin A1c     Status: None   Collection Time: 03/01/23  6:31 PM  Result Value Ref Range   Hgb A1c MFr Bld 5.5 4.8 - 5.6 %    Comment: (NOTE) Pre diabetes:          5.7%-6.4%  Diabetes:              >6.4%  Glycemic control for   <7.0% adults with diabetes    Mean Plasma Glucose 111.15 mg/dL    Comment: Performed at [facility], [address], [place], [place] [address]  Urinalysis, Routine w reflex microscopic -Urine, Clean Catch     Status: None   Collection Time: 03/01/23  8:25 PM  Result Value Ref Range   Color, Urine YELLOW YELLOW   APPearance CLEAR CLEAR   Specific Gravity, Urine <=1.005 1.005 - 1.030   pH 5.5 5.0 - 8.0   Glucose, UA NEGATIVE NEGATIVE mg/dL   Hgb urine dipstick NEGATIVE NEGATIVE   Bilirubin Urine NEGATIVE NEGATIVE   Ketones, ur NEGATIVE NEGATIVE mg/dL   Protein, ur NEGATIVE NEGATIVE mg/dL   Nitrite NEGATIVE NEGATIVE   Leukocytes,Ua NEGATIVE NEGATIVE    Comment: Microscopic not done on urines with negative protein, blood, leukocytes, nitrite, or glucose < 500 mg/dL. Performed at [facility], [address] [place], [place] [address]   Urine rapid drug screen (hosp performed)     Status: None   Collection Time: 03/01/23  8:26 PM  Result Value Ref Range   Opiates NONE DETECTED NONE DETECTED   Cocaine NONE DETECTED NONE DETECTED   Benzodiazepines  NONE DETECTED NONE DETECTED   Amphetamines NONE DETECTED NONE DETECTED   Tetrahydrocannabinol NONE DETECTED NONE DETECTED   Barbiturates NONE DETECTED NONE DETECTED    Comment: (NOTE) DRUG SCREEN FOR MEDICAL PURPOSES ONLY.  IF CONFIRMATION IS NEEDED FOR ANY PURPOSE, NOTIFY LAB WITHIN 5 DAYS.  LOWEST DETECTABLE LIMITS FOR URINE DRUG SCREEN Drug Class                     Cutoff (ng/mL) Amphetamine and metabolites    1000 Barbiturate and metabolites    200 Benzodiazepine                 200 Opiates and metabolites        300 Cocaine and metabolites        300 THC                            50 Performed at [facility], [address] [place], [place] [address]    CT ANGIO HEAD NECK W WO CM  Result Date: 03/01/2023 CLINICAL DATA:  Left arm and leg numbness, tingling to left side of face EXAM: CT ANGIOGRAPHY HEAD AND NECK WITH AND WITHOUT  CONTRAST TECHNIQUE: Multidetector CT imaging of the head and neck was performed using the standard protocol during bolus administration of intravenous contrast. Multiplanar CT image reconstructions and MIPs were obtained to evaluate the vascular anatomy. Carotid stenosis measurements (when applicable) are obtained utilizing NASCET criteria, using the distal internal carotid diameter as the denominator. RADIATION DOSE REDUCTION: This exam was performed according to the departmental dose-optimization program which includes automated exposure control, adjustment of the mA and/or kV according to patient size and/or use of iterative reconstruction technique. CONTRAST:  75mL OMNIPAQUE IOHEXOL 350 MG/ML SOLN COMPARISON:  No prior CTA available, correlation is made with CT head 03/01/2023 FINDINGS: CT HEAD FINDINGS For noncontrast findings, please see same day CT head. CTA NECK FINDINGS Aortic arch: Standard branching. Imaged portion shows no evidence of aneurysm or dissection. No significant stenosis of the major arch vessel origins. Right carotid system:  No evidence of dissection, occlusion, or hemodynamically significant stenosis (greater than 50%). Left carotid system: No evidence of dissection, occlusion, or hemodynamically significant stenosis (greater than 50%). Vertebral arteries: No evidence of dissection, occlusion, or hemodynamically significant stenosis (greater than 50%). Skeleton: No acute osseous abnormality. Degenerative changes in the cervical spine. Other neck: No acute finding. Upper chest: No focal pulmonary opacity or pleural effusion. Review of the MIP images confirms the above findings CTA HEAD FINDINGS Anterior circulation: Both internal carotid arteries are patent to the termini, without significant stenosis. A1 segments patent. Normal anterior communicating artery. Anterior cerebral arteries are patent to their distal aspects without significant stenosis. No M1 stenosis or occlusion. MCA branches perfused to their distal aspects without significant stenosis. Posterior circulation: Vertebral arteries patent to the vertebrobasilar junction without significant stenosis. Posterior inferior cerebellar arteries patent proximally. Basilar patent to its distal aspect without significant stenosis. Superior cerebellar arteries patent proximally. Patent P1 segments. PCAs perfused to their distal aspects without significant stenosis. The bilateral posterior communicating arteries are not visualized. Venous sinuses: As permitted by contrast timing, patent. Anatomic variants: None significant. No evidence of aneurysm or vascular malformation. Review of the MIP images confirms the above findings IMPRESSION: 1. No intracranial large vessel occlusion or significant stenosis. 2. No hemodynamically significant stenosis in the neck. Electronically Signed   By: Wiliam Ke M.D.   On: 03/01/2023 20:40   CT HEAD CODE STROKE WO CONTRAST  Result Date: 03/01/2023 CLINICAL DATA:  Code stroke.  Left-sided weakness EXAM: CT HEAD WITHOUT CONTRAST TECHNIQUE:  Contiguous axial images were obtained from the base of the skull through the vertex without intravenous contrast. RADIATION DOSE REDUCTION: This exam was performed according to the departmental dose-optimization program which includes automated exposure control, adjustment of the mA and/or kV according to patient size and/or use of iterative reconstruction technique. COMPARISON:  None Available. FINDINGS: Brain: No hemorrhage. No hydrocephalus. No extra-axial fluid collection. No mass effect. No mass lesion. CT evidence of an acute cortical infarct. Vascular: No hyperdense vessel or unexpected calcification. Skull: Normal. Negative for fracture or focal lesion. Sinuses/Orbits: No middle ear or mastoid effusion. Paranasal sinuses are clear. Orbits are unremarkable. Other: None. ASPECTS [facility] Stroke Program Early CT Score): 10 IMPRESSION: No hemorrhage or CT evidence of an acute cortical infarct. ASPECTS 10. Discussed with Dr. Suezanne Jacquet on 03/01/23 at 6:47 PM. Electronically Signed   By: Lorenza Cambridge M.D.   On: 03/01/2023 18:47    Pending Labs Unresulted Labs (From admission, onward)     Start     Ordered   03/02/23 0500  Lipid panel  (Labs)  Tomorrow  morning,   R       Comments: Fasting    03/01/23 1918            Vitals/Pain Today's Vitals   03/02/23 0123 03/02/23 0333 03/02/23 0400 03/02/23 0657  BP: 93/71  (!) 98/51 129/76  Pulse: 63  (!) 59 69  Resp: 18  16 (!) 25  Temp: 98.4 F (36.9 C) 97.6 F (36.4 C)  97.8 F (36.6 C)  TempSrc:  Axillary  Oral  SpO2: 98%  98% 100%  PainSc:    0-No pain    Isolation Precautions No active isolations  Medications Medications   stroke: early stages of recovery book (has no administration in time range)  aspirin chewable tablet 81 mg (has no administration in time range)  clopidogrel (PLAVIX) tablet 75 mg (has no administration in time range)  traZODone (DESYREL) tablet 25 mg (12.5 mg Oral Given 03/01/23 2241)  clopidogrel (PLAVIX)  tablet 300 mg (300 mg Oral Given 03/01/23 1952)  aspirin tablet 325 mg (325 mg Oral Given 03/01/23 1952)  iohexol (OMNIPAQUE) 350 MG/ML injection 75 mL (75 mLs Intravenous Contrast Given 03/01/23 1944)    Mobility walks with standby assist as needed      Focused Assessments Neuro Assessment Handoff:  Swallow screen pass? Yes  Cardiac Rhythm: Normal sinus rhythm NIH Stroke Scale  Dizziness Present: No Headache Present: No Interval: Shift assessment Level of Consciousness (1a.)   : Alert, keenly responsive LOC Questions (1b. )   : Answers both questions correctly LOC Commands (1c. )   : Performs both tasks correctly Best Gaze (2. )  : Normal Visual (3. )  : No visual loss Facial Palsy (4. )    : Normal symmetrical movements Motor Arm, Left (5a. )   : No drift Motor Arm, Right (5b. ) : No drift Motor Leg, Left (6a. )  : No drift Motor Leg, Right (6b. ) : No drift Limb Ataxia (7. ): Absent Sensory (8. )  : Normal, no sensory loss Best Language (9. )  : No aphasia Dysarthria (10. ): Normal Extinction/Inattention (11.)   : No Abnormality Complete NIHSS TOTAL: 0     Neuro Assessment: Exceptions to WDL Neuro Checks:   Initial (03/01/23 1820)  Has TPA been given? No If patient is a Neuro Trauma and patient is going to OR before floor call report to 4N Charge nurse: [phone] or [phone]   R Recommendations: See Admitting Provider Note  Report given to:   Additional Notes: Patient uses CPAP at HS. Has his personal equipment at bedside.

## 2023-03-02 NOTE — Progress Notes (Signed)
Around 5pm, RN called and stated that pt was ready for discharge, but complained of left sided tingling, same symptoms with current stroke. I came to see him, he felt tingling on the left arm and leg similar to the current stroke presentation but denies any speech difficulty, weakness or vision changes. Slight right sided head tightness under light with some photophobia. BP on the low end, 99/72 and 100/74. Put head of bed flat, give IVF bolus 1L and then IV infusion 75cc for 12 hours. Will repeat limited MRI over night to rule out new stroke. But symptoms more likely recrudescence from hypotension. He is still OK to have dinner tonight. Will check orthostatic vital in am. Continue current meds. Will follow. Discussed with Dr. Tyson Babinski.   Marvel Plan, MD PhD Stroke Neurology 03/02/2023 5:40 PM

## 2023-03-02 NOTE — H&P (Signed)
[facility] History and Physical  David [name] QVZ:[id number] DOB: [birth year] DOA: 03/01/2023  Referring physician: ED  PCP: Daisy Floro, MD   Patient is coming from: Home  Chief Complaint: Tingling and numbness  HPI:  72 year old male with history of hypertension, hypothyroidism, renal cell carcinoma status post left partial nephrectomy, ulcerative colitis status post total colectomy and ileostomy, OSA on CPAP, CKD stage IIIa, peripheral neuropathy presented to [facility] ED with complaint of left-sided tingling around 2 or 3 PM on 03/01/2023 which progressed to discoordination on the left side and leaning toward the left. He had used Viagra around 9 AM for sexual intercourse.  Patient stated that he has been taking testosterone injectable as outpatient due to low testosterone levels.  Denies any previous history of stroke or heart attack.  Denies change in speech and balance as per the family.  Patient denies any dizziness, lightheadedness, headache, syncope.  Denies any urinary urgency, frequency or dysuria.  Denies any changes in his bowel habits, nausea or vomiting.  At [facility] initial vitals showed a blood pressure elevated at 151/84.  Initial labs were notable for WBC at 11.0.  Hemoglobin 15.1.  Creatinine slightly elevated at 1.3.  Urinalysis was negative.  Urine drug screen was negative.  CT head negative for acute intracranial abnormality. CTA head and neck negative for LVO or significant stenosis. No significant lab abnormalities. Neurology consulted in the ED and patient was out of the window for thrombolytic treatment.  Neurology recommended brain MRI and admission for stroke versus TIA workup. Patient was given aspirin and Plavix.   Assessment and plan.  Left-sided tingling numbness.   Acute infarct in right caudate nucleus.  Stroke protocol on board.  CT of the head negative.  CTA negative for large vessel occlusion.  MRI of the brain with  acute infarct in the right caudate nucleus at 4 mm.  Neurology on board for follow-up.  Hemoglobin A1c of 5.5.  Check 2D echocardiogram.  Lipid panel pending.  Continue aspirin and Plavix.  Allow permissive hypertension for now.  Physical therapy, OT consultation.  History of hypertension.  On lisinopril at home.  Will hold for now for permissive hypertension.  History of hypothyroidism.  Continue Synthroid.  TSH 2 months back was 0.3.  Will repeat.  CKD stage IIIa, history of renal cell cancer status post nephrectomy,  Continue to monitor renal function.  ulcerative colitis status post colectomy end ileostomy.  Currently stable.  Obstructive sleep apnea on CPAP. Continue.  Hypogonadism.  On testosterone supplements.  Encouraged the patient to discuss this with his provider.  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    OA (osteoarthritis) of knee    both   OSA on CPAP    Personal history of asthma    childhood   Right nephrolithiasis    non-obstuctive per ct  03-14-2015   Right ureteral stone    S/P ileostomy (HCC)    UC (ulcerative colitis) [facility])    Past Surgical History:  Procedure Laterality Date   APPENDECTOMY  1981   CYSTO/  RIGHT RETROGRADE PYELOGRAM/  RIGHT STENT PLACEMENT  09-23-2006   KNEE ARTHROSCOPY Left 01/01/2015   Procedure: ARTHROSCOPY KNEE;  Surgeon: Salvatore Marvel, MD;  Location: [facility] OR;  Service: Orthopedics;  Laterality: Left;   PARTIAL  NEPHRECTOMY Left 2003   TONSILLECTOMY  1990's   TOTAL KNEE ARTHROPLASTY Left 01/01/2015   Procedure: TOTAL KNEE ARTHROPLASTY;  Surgeon: Salvatore Marvel, MD;  Location: [facility] OR;  Service: Orthopedics;  Laterality: Left;   TOTAL KNEE ARTHROPLASTY Right 02/12/2015   Procedure: TOTAL RIGHT KNEE ARTHROPLASTY;  Surgeon: Salvatore Marvel, MD;  Location: [facility] OR;  Service: Orthopedics;  Laterality: Right;   TOTAL PROCTOCOLECTOMY W/ END ILEOSTOMY  1981    Social History:  reports that he has never smoked. He has never used smokeless tobacco. He reports that he does not drink alcohol and does not use drugs.  Allergies  Allergen Reactions   Cymbalta [Duloxetine Hcl]     Impotence    Family History  Problem Relation Age of Onset   Cancer Father        thyroid   Prostate cancer Brother      Prior to Admission medications   Medication Sig Start Date End Date Taking? Authorizing Provider  aspirin EC 81 MG tablet Take 81 mg by mouth daily.    [provider]  levothyroxine (SYNTHROID) 112 MCG tablet Take 112 mcg by mouth daily. 11/06/18   [provider]  Lidocaine 4 % GEL Apply 1 g topically 2 (two) times daily as needed. 02/15/19   York Spaniel, MD  lisinopril (ZESTRIL) 10 MG tablet Take 10 mg by mouth daily. 02/02/19   [provider]  mometasone (ELOCON) 0.1 % cream Apply 1 application topically daily.    [provider]  Multiple Vitamin (MULTIVITAMIN WITH MINERALS) TABS Take 1 tablet by mouth daily.    [provider]  tamsulosin (FLOMAX) 0.4 MG CAPS capsule Take 0.4 mg by mouth as needed.     [provider]  traZODone (DESYREL) 150 MG tablet Take 75 mg by mouth at bedtime.    [provider]    Physical Exam: Vitals:   03/02/23 0800 03/02/23 0805 03/02/23 0815 03/02/23 0830  BP:  128/76 128/79   Pulse: 72 67 60   Resp: (!) 21 18 (!) 8 19  Temp:  98 F (36.7 C)    TempSrc:  Oral    SpO2: 100% 100% 98%    Wt Readings from  Last 3 Encounters:  05/17/20 81.2 kg  04/14/19 88.9 kg  02/14/19 90.5 kg   There is no height or weight on file to calculate BMI.  General:  Average built, not in obvious distress HENT: Normocephalic, No scleral pallor or icterus noted. Oral mucosa is moist.  Chest:  Clear breath sounds.  . No crackles or wheezes.  CVS: S1 &S2 heard. No murmur.  Regular rate and rhythm. Abdomen: Soft, nontender, nondistended.  Bowel sounds are heard. No abdominal mass palpated Extremities: No cyanosis, clubbing or edema.  Peripheral pulses are palpable. Psych: Alert, awake and oriented, normal mood CNS:  No cranial nerve deficits.  Power equal in all extremities.   Skin: Warm and dry.  No rashes noted.  Labs on Admission:   CBC: Recent Labs  Lab 03/01/23 1831  WBC 11.0*  NEUTROABS 6.2  HGB 15.1  HCT 45.0  MCV 91.6  PLT 308    Basic Metabolic Panel: Recent Labs  Lab 03/01/23 1831  NA 140  K 3.7  CL 103  CO2 24  GLUCOSE 84  BUN 15  CREATININE 1.34*  CALCIUM 9.8    Liver Function Tests: Recent Labs  Lab 03/01/23 1831  AST 27  ALT 30  ALKPHOS 57  BILITOT 0.9  PROT 7.1  ALBUMIN 3.9   No results for input(s): "LIPASE", "AMYLASE" in the last 168 hours. No results for input(s): "AMMONIA" in the last 168 hours.  Cardiac Enzymes: No results for input(s): "CKTOTAL", "CKMB", "CKMBINDEX", "TROPONINI" in the last 168 hours.  BNP (last 3 results) No results for input(s): "BNP" in the last 8760 hours.  ProBNP (last 3 results) No results for input(s): "PROBNP" in the last 8760 hours.  CBG: Recent Labs  Lab 03/01/23 1827  GLUCAP 81    Lipase     Component Value Date/Time   LIPASE 56 (H) 05/17/2020 0852     Urinalysis    Component Value Date/Time   COLORURINE YELLOW 03/01/2023 2025   APPEARANCEUR CLEAR 03/01/2023 2025   LABSPEC <=1.005 03/01/2023 2025   PHURINE 5.5 03/01/2023 2025   GLUCOSEU NEGATIVE 03/01/2023 2025   HGBUR NEGATIVE 03/01/2023 2025   BILIRUBINUR  NEGATIVE 03/01/2023 2025   KETONESUR NEGATIVE 03/01/2023 2025   PROTEINUR NEGATIVE 03/01/2023 2025   UROBILINOGEN 0.2 01/02/2015 1333   NITRITE NEGATIVE 03/01/2023 2025   LEUKOCYTESUR NEGATIVE 03/01/2023 2025     Drugs of Abuse     Component Value Date/Time   LABOPIA NONE DETECTED 03/01/2023 2026   COCAINSCRNUR NONE DETECTED 03/01/2023 2026   LABBENZ NONE DETECTED 03/01/2023 2026   AMPHETMU NONE DETECTED 03/01/2023 2026   THCU NONE DETECTED 03/01/2023 2026   LABBARB NONE DETECTED 03/01/2023 2026      Radiological Exams on Admission: CT ANGIO HEAD NECK W WO CM  Result Date: 03/01/2023 CLINICAL DATA:  Left arm and leg numbness, tingling to left side of face EXAM: CT ANGIOGRAPHY HEAD AND NECK WITH AND WITHOUT CONTRAST TECHNIQUE: Multidetector CT imaging of the head and neck was performed using the standard protocol during bolus administration of intravenous contrast. Multiplanar CT image reconstructions and MIPs were obtained to evaluate the vascular anatomy. Carotid stenosis measurements (when applicable) are obtained utilizing NASCET criteria, using the distal internal carotid diameter as the denominator. RADIATION DOSE REDUCTION: This exam was performed according to the departmental dose-optimization program which includes automated exposure control, adjustment of the mA and/or kV according to patient size and/or use of iterative reconstruction technique. CONTRAST:  75mL OMNIPAQUE IOHEXOL 350 MG/ML SOLN COMPARISON:  No prior CTA available, correlation is made with CT head 03/01/2023 FINDINGS: CT HEAD FINDINGS For noncontrast findings, please see same day CT head. CTA NECK FINDINGS Aortic arch: Standard branching. Imaged portion shows no evidence of aneurysm or dissection. No significant stenosis of the major arch vessel origins. Right carotid system: No evidence of dissection, occlusion, or hemodynamically significant stenosis (greater than 50%). Left carotid system: No evidence of dissection,  occlusion, or hemodynamically significant stenosis (greater than 50%). Vertebral arteries: No evidence of dissection, occlusion, or hemodynamically significant stenosis (greater than 50%). Skeleton: No acute osseous abnormality. Degenerative changes in the cervical spine. Other neck: No acute finding. Upper chest: No focal pulmonary opacity or pleural effusion. Review of the MIP images confirms the above findings CTA HEAD FINDINGS Anterior circulation: Both internal carotid arteries are  patent to the termini, without significant stenosis. A1 segments patent. Normal anterior communicating artery. Anterior cerebral arteries are patent to their distal aspects without significant stenosis. No M1 stenosis or occlusion. MCA branches perfused to their distal aspects without significant stenosis. Posterior circulation: Vertebral arteries patent to the vertebrobasilar junction without significant stenosis. Posterior inferior cerebellar arteries patent proximally. Basilar patent to its distal aspect without significant stenosis. Superior cerebellar arteries patent proximally. Patent P1 segments. PCAs perfused to their distal aspects without significant stenosis. The bilateral posterior communicating arteries are not visualized. Venous sinuses: As permitted by contrast timing, patent. Anatomic variants: None significant. No evidence of aneurysm or vascular malformation. Review of the MIP images confirms the above findings IMPRESSION: 1. No intracranial large vessel occlusion or significant stenosis. 2. No hemodynamically significant stenosis in the neck. Electronically Signed   By: Wiliam Ke M.D.   On: 03/01/2023 20:40   CT HEAD CODE STROKE WO CONTRAST  Result Date: 03/01/2023 CLINICAL DATA:  Code stroke.  Left-sided weakness EXAM: CT HEAD WITHOUT CONTRAST TECHNIQUE: Contiguous axial images were obtained from the base of the skull through the vertex without intravenous contrast. RADIATION DOSE REDUCTION: This exam was  performed according to the departmental dose-optimization program which includes automated exposure control, adjustment of the mA and/or kV according to patient size and/or use of iterative reconstruction technique. COMPARISON:  None Available. FINDINGS: Brain: No hemorrhage. No hydrocephalus. No extra-axial fluid collection. No mass effect. No mass lesion. CT evidence of an acute cortical infarct. Vascular: No hyperdense vessel or unexpected calcification. Skull: Normal. Negative for fracture or focal lesion. Sinuses/Orbits: No middle ear or mastoid effusion. Paranasal sinuses are clear. Orbits are unremarkable. Other: None. ASPECTS [facility] Stroke Program Early CT Score): 10 IMPRESSION: No hemorrhage or CT evidence of an acute cortical infarct. ASPECTS 10. Discussed with Dr. Suezanne Jacquet on 03/01/23 at 6:47 PM. Electronically Signed   By: Lorenza Cambridge M.D.   On: 03/01/2023 18:47    EKG: Personally reviewed by me which shows normal sinus rhythm   Consultant: Neurology  Code Status: Full code  Microbiology none  Antibiotics: None  Family Communication:  Patients' condition and plan of care including tests being ordered have been discussed with the patient  who indicate understanding and agree with the plan.   Status is: Observation The patient remains OBS appropriate and will d/c before 2 midnights.   Severity of Illness: The appropriate patient status for this patient is OBSERVATION. Observation status is judged to be reasonable and necessary in order to provide the required intensity of service to ensure the patient's safety. The patient's presenting symptoms, physical exam findings, and initial radiographic and laboratory data in the context of their medical condition is felt to place them at decreased risk for further clinical deterioration. Furthermore, it is anticipated that the patient will be medically stable for discharge from the hospital within 2 midnights of admission.    Signed, Joycelyn Das, MD [facility] 03/02/2023

## 2023-03-02 NOTE — ED Notes (Signed)
Pt placed on home CPAP.

## 2023-03-02 NOTE — ED Notes (Signed)
[facility] contacted for transport

## 2023-03-02 NOTE — TOC Transition Note (Signed)
Transition of Care [facility]) - CM/SW Discharge Note   Patient Details  Name: David [name] MRN: [id number] Date of Birth: 29-May-1950  [facility] of [facility] CM/SW Contact:  Gordy Clement, RN Phone Number: 03/02/2023, 3:50 PM   Clinical Narrative:   Patient to DC home  with Wife. There are no recommendations for follow up PT or OT   Patient woill follow up as instructed on AVS.           Patient Goals and CMS Choice      Discharge Placement                         Discharge Plan and Services Additional resources added to the After Visit Summary for                                       Social Determinants of Health (SDOH) Interventions SDOH Screenings   Food Insecurity: Low Risk  (01/12/2023)   Received from [facility]  Transportation Needs: No Transportation Needs (01/12/2023)   Received from [facility]  Utilities: Low Risk  (01/12/2023)   Received from [facility]  Tobacco Use: Low Risk  (03/01/2023)     Readmission Risk Interventions     No data to display

## 2023-03-02 NOTE — Progress Notes (Signed)
Echocardiogram 2D Echocardiogram has been performed.  Lucendia Herrlich 03/02/2023, 2:59 PM

## 2023-03-02 NOTE — Progress Notes (Signed)
STROKE TEAM PROGRESS NOTE   SUBJECTIVE (INTERVAL HISTORY) No family is at the bedside.  Overall his condition is completely resolved. Pt stated that his left sided numbness and discoordination have resolved. He had statin intolerance with crestor 20 but now willing to try crestor 10. He was on clomid before and stopped 2 months ago. Now on testosterone injection.    OBJECTIVE Temp:  [97.6 F (36.4 C)-98.6 F (37 C)] 98 F (36.7 C) (11/25 0805) Pulse Rate:  [59-110] 60 (11/25 0815) Cardiac Rhythm: Normal sinus rhythm (11/25 0809) Resp:  [8-29] 19 (11/25 0830) BP: (93-151)/(51-93) 128/79 (11/25 0815) SpO2:  [92 %-100 %] 98 % (11/25 0815)  Recent Labs  Lab 03/01/23 1827  GLUCAP 81   Recent Labs  Lab 03/01/23 1831  NA 140  K 3.7  CL 103  CO2 24  GLUCOSE 84  BUN 15  CREATININE 1.34*  CALCIUM 9.8   Recent Labs  Lab 03/01/23 1831  AST 27  ALT 30  ALKPHOS 57  BILITOT 0.9  PROT 7.1  ALBUMIN 3.9   Recent Labs  Lab 03/01/23 1831  WBC 11.0*  NEUTROABS 6.2  HGB 15.1  HCT 45.0  MCV 91.6  PLT 308   No results for input(s): "CKTOTAL", "CKMB", "CKMBINDEX", "TROPONINI" in the last 168 hours. Recent Labs    03/01/23 1831  LABPROT 13.7  INR 1.0   Recent Labs    03/01/23 2025  COLORURINE YELLOW  LABSPEC <=1.005  PHURINE 5.5  GLUCOSEU NEGATIVE  HGBUR NEGATIVE  BILIRUBINUR NEGATIVE  KETONESUR NEGATIVE  PROTEINUR NEGATIVE  NITRITE NEGATIVE  LEUKOCYTESUR NEGATIVE    No results found for: "CHOL", "TRIG", "HDL", "CHOLHDL", "VLDL", "LDLCALC" Lab Results  Component Value Date   HGBA1C 5.5 03/01/2023      Component Value Date/Time   LABOPIA NONE DETECTED 03/01/2023 2026   COCAINSCRNUR NONE DETECTED 03/01/2023 2026   LABBENZ NONE DETECTED 03/01/2023 2026   AMPHETMU NONE DETECTED 03/01/2023 2026   THCU NONE DETECTED 03/01/2023 2026   LABBARB NONE DETECTED 03/01/2023 2026    Recent Labs  Lab 03/01/23 1831  ETH <10    I have personally reviewed the  radiological images below and agree with the radiology interpretations.  ECHOCARDIOGRAM COMPLETE  Result Date: 03/02/2023    ECHOCARDIOGRAM REPORT   Patient Name:   ELIJAHJAMES [name] Date of Exam: 03/02/2023 Medical Rec #:  [id number]        Height:       68.0 in Accession #:    [id number]       Weight:       179.0 lb Date of Birth:  04-22-1950         BSA:          1.950 m Patient Age:    72 years         BP:           128/79 mmHg Patient Gender: M                HR:           73 bpm. Exam Location:  Inpatient Procedure: 2D Echo, Cardiac Doppler and Color Doppler Indications:    Stroke I63.9  History:        Patient has no prior history of Echocardiogram examinations.                 TIA; Risk Factors:Sleep Apnea and Hypertension.  Sonographer:    Lucendia Herrlich RCS Referring Phys: [id number] SRISHTI L  BHAGAT IMPRESSIONS  1. Left ventricular ejection fraction, by estimation, is 60 to 65%. The left ventricle has normal function. The left ventricle has no regional wall motion abnormalities. Left ventricular diastolic parameters are consistent with Grade I diastolic dysfunction (impaired relaxation).  2. Right ventricular systolic function is normal. The right ventricular size is normal. Tricuspid regurgitation signal is inadequate for assessing PA pressure.  3. The mitral valve is normal in structure. No evidence of mitral valve regurgitation. No evidence of mitral stenosis.  4. The aortic valve is tricuspid. Aortic valve regurgitation is not visualized. No aortic stenosis is present.  5. The inferior vena cava is dilated in size with >50% respiratory variability, suggesting right atrial pressure of 8 mmHg. FINDINGS  Left Ventricle: Left ventricular ejection fraction, by estimation, is 60 to 65%. The left ventricle has normal function. The left ventricle has no regional wall motion abnormalities. The left ventricular internal cavity size was normal in size. There is  no left ventricular hypertrophy. Left ventricular  diastolic parameters are consistent with Grade I diastolic dysfunction (impaired relaxation). Right Ventricle: The right ventricular size is normal. No increase in right ventricular wall thickness. Right ventricular systolic function is normal. Tricuspid regurgitation signal is inadequate for assessing PA pressure. The tricuspid regurgitant velocity is 1.36 m/s, and with an assumed right atrial pressure of 8 mmHg, the estimated right ventricular systolic pressure is 15.4 mmHg. Left Atrium: Left atrial size was normal in size. Right Atrium: Right atrial size was normal in size. Pericardium: There is no evidence of pericardial effusion. Mitral Valve: The mitral valve is normal in structure. No evidence of mitral valve regurgitation. No evidence of mitral valve stenosis. Tricuspid Valve: The tricuspid valve is normal in structure. Tricuspid valve regurgitation is trivial. Aortic Valve: The aortic valve is tricuspid. Aortic valve regurgitation is not visualized. No aortic stenosis is present. Aortic valve peak gradient measures 7.2 mmHg. Pulmonic Valve: The pulmonic valve was normal in structure. Pulmonic valve regurgitation is trivial. Aorta: The aortic root is normal in size and structure. Venous: The inferior vena cava is dilated in size with greater than 50% respiratory variability, suggesting right atrial pressure of 8 mmHg. IAS/Shunts: No atrial level shunt detected by color flow Doppler.  LEFT VENTRICLE PLAX 2D LVIDd:         3.80 cm   Diastology LVIDs:         2.60 cm   LV e' medial:    4.79 cm/s LV PW:         1.00 cm   LV E/e' medial:  12.5 LV IVS:        0.90 cm   LV e' lateral:   6.96 cm/s LVOT diam:     2.20 cm   LV E/e' lateral: 8.6 LV SV:         68 LV SV Index:   35 LVOT Area:     3.80 cm  RIGHT VENTRICLE             IVC RV S prime:     17.50 cm/s  IVC diam: 2.40 cm TAPSE (M-mode): 1.4 cm LEFT ATRIUM             Index        RIGHT ATRIUM           Index LA diam:        4.20 cm 2.15 cm/m   RA Area:      10.60 cm LA Vol (A2C):   29.7 ml  15.23 ml/m  RA Volume:   16.20 ml  8.31 ml/m LA Vol (A4C):   23.2 ml 11.90 ml/m LA Biplane Vol: 26.6 ml 13.64 ml/m  AORTIC VALVE AV Area (Vmax): 3.14 cm AV Vmax:        134.00 cm/s AV Peak Grad:   7.2 mmHg LVOT Vmax:      110.60 cm/s LVOT Vmean:     69.320 cm/s LVOT VTI:       0.180 m  AORTA Ao Root diam: 3.50 cm Ao Asc diam:  3.50 cm MITRAL VALVE               TRICUSPID VALVE MV Area (PHT): 2.80 cm    TR Peak grad:   7.4 mmHg MV Decel Time: 271 msec    TR Vmax:        136.00 cm/s MV E velocity: 59.70 cm/s MV A velocity: 84.40 cm/s  SHUNTS MV E/A ratio:  0.71        Systemic VTI:  0.18 m                            Systemic Diam: 2.20 cm Dalton McleanMD Electronically signed by Wilfred Lacy Signature Date/Time: 03/02/2023/3:00:45 PM    Final    MR BRAIN WO CONTRAST  Result Date: 03/02/2023 CLINICAL DATA:  Provided history: Neuro deficit, acute, stroke suspected. Non-contrast head CT EXAM: MRI HEAD WITHOUT CONTRAST TECHNIQUE: Multiplanar, multiecho pulse sequences of the brain and surrounding structures were obtained without intravenous contrast. COMPARISON:  And CT angiogram head/neck 03/01/2023. FINDINGS: Brain: Mild generalized cerebral volume loss. 4 mm acute infarct within the right caudate nucleus/corona radiata (series 3, image 34) (series 4, image 22). Small chronic lacunar infarct within the right lentiform nucleus/anterior limb of right internal capsule (series 7, image 20). Background mild multifocal T2 FLAIR hyperintense signal abnormality within the cerebral white matter and pons, nonspecific but compatible with chronic small vessel ischemic disease. No evidence of an intracranial mass. No chronic intracranial blood products. No extra-axial fluid collection. No midline shift. Vascular: Maintained flow voids within the proximal large arterial vessels. Skull and upper cervical spine: No focal worrisome marrow lesion. Sinuses/Orbits: No mass or acute finding  within the imaged orbits. Minimal mucosal thickening within bilateral ethmoid air cells. Impression #1 will be called to the ordering clinician or representative by the Radiologist Assistant, and communication documented in the PACS or [employer]. IMPRESSION: 1. 4 mm acute infarct within the right caudate nucleus/corona radiata. 2. Small chronic lacunar infarct within the right lentiform nucleus/anterior limb of right internal capsule. 3. Mild background chronic small vessel ischemic changes within the cerebral white matter and pons. 4. Mild generalized cerebral atrophy. Electronically Signed   By: Jackey Loge D.O.   On: 03/02/2023 11:51   CT ANGIO HEAD NECK W WO CM  Result Date: 03/01/2023 CLINICAL DATA:  Left arm and leg numbness, tingling to left side of face EXAM: CT ANGIOGRAPHY HEAD AND NECK WITH AND WITHOUT CONTRAST TECHNIQUE: Multidetector CT imaging of the head and neck was performed using the standard protocol during bolus administration of intravenous contrast. Multiplanar CT image reconstructions and MIPs were obtained to evaluate the vascular anatomy. Carotid stenosis measurements (when applicable) are obtained utilizing NASCET criteria, using the distal internal carotid diameter as the denominator. RADIATION DOSE REDUCTION: This exam was performed according to the departmental dose-optimization program which includes automated exposure control, adjustment of the mA and/or kV according to patient size  and/or use of iterative reconstruction technique. CONTRAST:  75mL OMNIPAQUE IOHEXOL 350 MG/ML SOLN COMPARISON:  No prior CTA available, correlation is made with CT head 03/01/2023 FINDINGS: CT HEAD FINDINGS For noncontrast findings, please see same day CT head. CTA NECK FINDINGS Aortic arch: Standard branching. Imaged portion shows no evidence of aneurysm or dissection. No significant stenosis of the major arch vessel origins. Right carotid system: No evidence of dissection, occlusion, or  hemodynamically significant stenosis (greater than 50%). Left carotid system: No evidence of dissection, occlusion, or hemodynamically significant stenosis (greater than 50%). Vertebral arteries: No evidence of dissection, occlusion, or hemodynamically significant stenosis (greater than 50%). Skeleton: No acute osseous abnormality. Degenerative changes in the cervical spine. Other neck: No acute finding. Upper chest: No focal pulmonary opacity or pleural effusion. Review of the MIP images confirms the above findings CTA HEAD FINDINGS Anterior circulation: Both internal carotid arteries are patent to the termini, without significant stenosis. A1 segments patent. Normal anterior communicating artery. Anterior cerebral arteries are patent to their distal aspects without significant stenosis. No M1 stenosis or occlusion. MCA branches perfused to their distal aspects without significant stenosis. Posterior circulation: Vertebral arteries patent to the vertebrobasilar junction without significant stenosis. Posterior inferior cerebellar arteries patent proximally. Basilar patent to its distal aspect without significant stenosis. Superior cerebellar arteries patent proximally. Patent P1 segments. PCAs perfused to their distal aspects without significant stenosis. The bilateral posterior communicating arteries are not visualized. Venous sinuses: As permitted by contrast timing, patent. Anatomic variants: None significant. No evidence of aneurysm or vascular malformation. Review of the MIP images confirms the above findings IMPRESSION: 1. No intracranial large vessel occlusion or significant stenosis. 2. No hemodynamically significant stenosis in the neck. Electronically Signed   By: Wiliam Ke M.D.   On: 03/01/2023 20:40   CT HEAD CODE STROKE WO CONTRAST  Result Date: 03/01/2023 CLINICAL DATA:  Code stroke.  Left-sided weakness EXAM: CT HEAD WITHOUT CONTRAST TECHNIQUE: Contiguous axial images were obtained from the  base of the skull through the vertex without intravenous contrast. RADIATION DOSE REDUCTION: This exam was performed according to the departmental dose-optimization program which includes automated exposure control, adjustment of the mA and/or kV according to patient size and/or use of iterative reconstruction technique. COMPARISON:  None Available. FINDINGS: Brain: No hemorrhage. No hydrocephalus. No extra-axial fluid collection. No mass effect. No mass lesion. CT evidence of an acute cortical infarct. Vascular: No hyperdense vessel or unexpected calcification. Skull: Normal. Negative for fracture or focal lesion. Sinuses/Orbits: No middle ear or mastoid effusion. Paranasal sinuses are clear. Orbits are unremarkable. Other: None. ASPECTS [facility] Stroke Program Early CT Score): 10 IMPRESSION: No hemorrhage or CT evidence of an acute cortical infarct. ASPECTS 10. Discussed with Dr. Suezanne Jacquet on 03/01/23 at 6:47 PM. Electronically Signed   By: Lorenza Cambridge M.D.   On: 03/01/2023 18:47     PHYSICAL EXAM  Temp:  [97.6 F (36.4 C)-98.6 F (37 C)] 98 F (36.7 C) (11/25 0805) Pulse Rate:  [59-110] 60 (11/25 0815) Resp:  [8-29] 19 (11/25 0830) BP: (93-151)/(51-93) 128/79 (11/25 0815) SpO2:  [92 %-100 %] 98 % (11/25 0815)  General - Well nourished, well developed, in no apparent distress.  Ophthalmologic - fundi not visualized due to noncooperation.  Cardiovascular - Regular rhythm and rate.  Mental Status -  Level of arousal and orientation to time, place, and person were intact. Language including expression, naming, repetition, comprehension was assessed and found intact. Attention span and concentration were normal. Fund of Knowledge  was assessed and was intact.  Cranial Nerves II - XII - II - Visual field intact OU. III, IV, VI - Extraocular movements intact. V - Facial sensation intact bilaterally. VII - Facial movement intact bilaterally. VIII - Hearing & vestibular intact  bilaterally. X - Palate elevates symmetrically. XI - Chin turning & shoulder shrug intact bilaterally. XII - Tongue protrusion intact.  Motor Strength - The patient's strength was normal in all extremities and pronator drift was absent.  Bulk was normal and fasciculations were absent.   Motor Tone - Muscle tone was assessed at the neck and appendages and was normal.  Reflexes - The patient's reflexes were symmetrical in all extremities and he had no pathological reflexes.  Sensory - Light touch, temperature/pinprick were assessed and were symmetrical.    Coordination - The patient had normal movements in the hands and feet with no ataxia or dysmetria.  Tremor was absent.  Gait and Station - deferred.   ASSESSMENT/PLAN Mr. David [name] is a 72 y.o. male with history of HTN, OSA, remote ulcerative colitis s/p total colectomy many years ago with ileostomy in place, left kidney cancer, CKD stage IIIa, peripheral neuropathy admitted for left sided tingling and dyscoordination. No tPA given due to outside window.    Stroke:  right lacunar infarct at CR, likely secondary to small vessel disease source.  CT no acute finding CTA head and neck unremarkable MRI  4 mm acute infarct within the right caudate nucleus/corona radiata. Small chronic lacunar infarct within the right lentiform nucleus/anterior limb of right internal capsule. 2D Echo  EF 60-65% LDL pending HgbA1c 5.5 UDS neg lovenox for VTE prophylaxis No antithrombotic prior to admission, now on aspirin 81 mg daily and clopidogrel 75 mg daily DAPT for 3 weeks and then ASA alone Ongoing aggressive stroke risk factor management Therapy recommendations:  none Disposition:  home  Hypertension Stable Long term BP goal normotensive  Hyperlipidemia Home meds:  none  LDL pending, goal < 70 Hx of cretor 20 intolerance but willing to re-challenge with crestor 10 Continue statin at discharge  Other Stroke Risk Factors Advanced  age Obstructive sleep apnea Hx of Clomid use, stopped 2 months ago On testosterone, pt encourage to discuss with PCP.    Other Active Problems CKD 3a, Cre 1.34 Mild leukocytosis WBC 11.0  Hospital day # 0  Neurology will sign off. Please call with questions. Pt will follow up with [facility] per pt preference in about 4 weeks. Thanks for the consult.   Marvel Plan, MD PhD Stroke Neurology 03/02/2023 4:02 PM    To contact Stroke Continuity provider, please refer to [email or web address]. After hours, contact General Neurology

## 2023-03-02 NOTE — Evaluation (Signed)
Occupational Therapy Evaluation Patient Details Name: David [name] MRN: [id number] DOB: 1950/04/27 Today's Date: 03/02/2023   History of Present Illness Pt is a 72 y/o M presenting to ED on 11/24 with LUE/LLE and L facial numbness, MRI with 4mm acute infarct in R caudate nucleus/corona radiata, small chronic lacunar infarct in R lentiform nucleus. PMH includes ulcerative colitis, anxiety, GERD, graves disease, SBO, HTN, hypothyroidism, ileostomy.   Clinical Impression   Pt reports ind at baseline with ADLs/functional mobility, lives with spouse who can assist at d/c. Pt currently at baseline performing ADLs without assist, mod I for bed mobility and transfers without AD. Pt reports LUE/LLE symptoms have resolved, denies sensation changes. Pt educated on [employer] and verbalized understanding. Pt presenting with impairments listed below, however has no acute OT needs at this time, will s/o. Please reconsult if there is a change in pt status. Anticipate no OT follow up needs at d/c.       If plan is discharge home, recommend the following: Assist for transportation    Functional Status Assessment  Patient has had a recent decline in their functional status and demonstrates the ability to make significant improvements in function in a reasonable and predictable amount of time.  Equipment Recommendations  None recommended by OT (pt has all needed DME)    Recommendations for Other Services       Precautions / Restrictions Precautions Precautions: Fall Restrictions Weight Bearing Restrictions: No      Mobility Bed Mobility Overal bed mobility: Modified Independent                  Transfers Overall transfer level: Modified independent Equipment used: None                      Balance Overall balance assessment: No apparent balance deficits (not formally assessed)                                         ADL either performed or assessed with  clinical judgement   ADL Overall ADL's : At baseline                                       General ADL Comments: pt perfroming LB ADL and toilet transfer without assist     Vision Baseline Vision/History: 1 Wears glasses Vision Assessment?: No apparent visual deficits     Perception Perception: Not tested       Praxis Praxis: Not tested       Pertinent Vitals/Pain Pain Assessment Pain Assessment: No/denies pain     Extremity/Trunk Assessment Upper Extremity Assessment Upper Extremity Assessment: Overall WFL for tasks assessed (reports numbness has resolved)   Lower Extremity Assessment Lower Extremity Assessment: Defer to PT evaluation   Cervical / Trunk Assessment Cervical / Trunk Assessment: Normal   Communication Communication Communication: No apparent difficulties   Cognition Arousal: Alert Behavior During Therapy: WFL for tasks assessed/performed Overall Cognitive Status: Within Functional Limits for tasks assessed                                       General Comments  VSS on RA    Exercises     Shoulder  Instructions      Home Living Family/patient expects to be discharged to:: Private residence Living Arrangements: Spouse/significant other Available Help at Discharge: Family;Available 24 hours/day Type of Home: House Home Access: Stairs to enter [employer] of Steps: 1   Home Layout: Two level;Able to live on main level with bedroom/bathroom Alternate Level Stairs-Number of Steps: flight   Bathroom Shower/Tub: Walk-in shower         Home Equipment: Agricultural consultant (2 wheels);BSC/3in1;Shower seat;Cane - single point          Prior Functioning/Environment Prior Level of Function : Needs assist             Mobility Comments: no AD use, plays pickleball/golf ADLs Comments: ind        OT Problem List:        OT Treatment/Interventions:      OT Goals(Current goals can be found in the  care plan section) Acute Rehab OT Goals Patient Stated Goal: none stated OT Goal Formulation: With patient Time For Goal Achievement: 03/16/23 Potential to Achieve Goals: Good  OT Frequency:      Co-evaluation              AM-PAC OT "6 Clicks" Daily Activity     Outcome Measure Help from another person eating meals?: None Help from another person taking care of personal grooming?: None Help from another person toileting, which includes using toliet, bedpan, or urinal?: None Help from another person bathing (including washing, rinsing, drying)?: A Little Help from another person to put on and taking off regular upper body clothing?: None Help from another person to put on and taking off regular lower body clothing?: None 6 Click Score: 23   End of Session Nurse Communication: Mobility status  Activity Tolerance: Patient tolerated treatment well Patient left: in bed;with call bell/phone within reach  OT Visit Diagnosis: Muscle weakness (generalized) (M62.81)                Time: 6606-3016 OT Time Calculation (min): 18 min Charges:  OT General Charges $OT Visit: 1 Visit OT Evaluation $OT Eval Low Complexity: 1 Low  Jarod Bozzo K, OTD, OTR/L SecureChat Preferred Acute Rehab [phone]   Sevon Rotert K Koonce 03/02/2023, 1:31 PM

## 2023-03-03 DIAGNOSIS — I639 Cerebral infarction, unspecified: Secondary | ICD-10-CM | POA: Diagnosis not present

## 2023-03-03 DIAGNOSIS — R2990 Unspecified symptoms and signs involving the nervous system: Secondary | ICD-10-CM | POA: Diagnosis not present

## 2023-03-03 LAB — LIPID PANEL
Cholesterol: 152 mg/dL (ref 0–200)
HDL: 28 mg/dL — ABNORMAL LOW (ref >40)
LDL Cholesterol: 91 mg/dL (ref 0–99)
Total CHOL/HDL Ratio: 5.4 {ratio}
Triglycerides: 165 mg/dL — ABNORMAL HIGH (ref <150)
VLDL: 33 mg/dL (ref 0–40)

## 2023-03-03 LAB — CBC
HCT: 44 % (ref 39.0–52.0)
Hemoglobin: 14.7 g/dL (ref 13.0–17.0)
MCH: 30.8 pg (ref 26.0–34.0)
MCHC: 33.4 g/dL (ref 30.0–36.0)
MCV: 92.2 fL (ref 80.0–100.0)
Platelets: 253 10*3/uL (ref 150–400)
RBC: 4.77 MIL/uL (ref 4.22–5.81)
RDW: 12.4 % (ref 11.5–15.5)
WBC: 8.7 10*3/uL (ref 4.0–10.5)
nRBC: 0 % (ref 0.0–0.2)

## 2023-03-03 LAB — BASIC METABOLIC PANEL
Anion gap: 7 (ref 5–15)
BUN: 13 mg/dL (ref 8–23)
CO2: 21 mmol/L — ABNORMAL LOW (ref 22–32)
Calcium: 9.1 mg/dL (ref 8.9–10.3)
Chloride: 108 mmol/L (ref 98–111)
Creatinine, Ser: 1.27 mg/dL — ABNORMAL HIGH (ref 0.61–1.24)
GFR, Estimated: >60 mL/min (ref >60)
Glucose, Bld: 91 mg/dL (ref 70–99)
Potassium: 4.2 mmol/L (ref 3.5–5.1)
Sodium: 136 mmol/L (ref 135–145)

## 2023-03-03 MED ORDER — LISINOPRIL 10 MG PO TABS: 10.0000 mg | ORAL_TABLET | Freq: Every day | ORAL | Status: DC

## 2023-03-03 NOTE — Plan of Care (Signed)

## 2023-03-03 NOTE — Progress Notes (Signed)
STROKE TEAM PROGRESS NOTE   SUBJECTIVE (INTERVAL HISTORY) No family is at the bedside. He is just out of the bathroom. Doing well, no symptoms now. BP stable, orthostatic vital neg. MRI no new stroke. Eager to go home.    OBJECTIVE Temp:  [97.3 F (36.3 C)-98.2 F (36.8 C)] 97.8 F (36.6 C) (11/26 1149) Pulse Rate:  [57-80] 70 (11/26 1149) Cardiac Rhythm: Normal sinus rhythm;Bundle branch block (11/26 0731) Resp:  [12-24] 14 (11/26 1149) BP: (114-145)/(76-105) 134/80 (11/26 1149) SpO2:  [95 %] 95 % (11/25 2239)  Recent Labs  Lab 03/01/23 1827 03/02/23 1659  GLUCAP 81 91   Recent Labs  Lab 03/01/23 1831 03/03/23 0330  NA 140 136  K 3.7 4.2  CL 103 108  CO2 24 21*  GLUCOSE 84 91  BUN 15 13  CREATININE 1.34* 1.27*  CALCIUM 9.8 9.1   Recent Labs  Lab 03/01/23 1831  AST 27  ALT 30  ALKPHOS 57  BILITOT 0.9  PROT 7.1  ALBUMIN 3.9   Recent Labs  Lab 03/01/23 1831 03/03/23 0330  WBC 11.0* 8.7  NEUTROABS 6.2  --   HGB 15.1 14.7  HCT 45.0 44.0  MCV 91.6 92.2  PLT 308 253   No results for input(s): "CKTOTAL", "CKMB", "CKMBINDEX", "TROPONINI" in the last 168 hours. Recent Labs    03/01/23 1831  LABPROT 13.7  INR 1.0   Recent Labs    03/01/23 2025  COLORURINE YELLOW  LABSPEC <=1.005  PHURINE 5.5  GLUCOSEU NEGATIVE  HGBUR NEGATIVE  BILIRUBINUR NEGATIVE  KETONESUR NEGATIVE  PROTEINUR NEGATIVE  NITRITE NEGATIVE  LEUKOCYTESUR NEGATIVE       Component Value Date/Time   CHOL 152 03/03/2023 0330   TRIG 165 (H) 03/03/2023 0330   HDL 28 (L) 03/03/2023 0330   CHOLHDL 5.4 03/03/2023 0330   VLDL 33 03/03/2023 0330   LDLCALC 91 03/03/2023 0330   Lab Results  Component Value Date   HGBA1C 5.5 03/01/2023      Component Value Date/Time   LABOPIA NONE DETECTED 03/01/2023 2026   COCAINSCRNUR NONE DETECTED 03/01/2023 2026   LABBENZ NONE DETECTED 03/01/2023 2026   AMPHETMU NONE DETECTED 03/01/2023 2026   THCU NONE DETECTED 03/01/2023 2026   LABBARB NONE  DETECTED 03/01/2023 2026    Recent Labs  Lab 03/01/23 1831  ETH <10    I have personally reviewed the radiological images below and agree with the radiology interpretations.  MR BRAIN WO CONTRAST  Result Date: 03/03/2023 CLINICAL DATA:  Follow-up examination for stroke. EXAM: MRI HEAD WITHOUT CONTRAST TECHNIQUE: Multiplanar, multiecho pulse sequences of the brain and surrounding structures were obtained without intravenous contrast. COMPARISON:  Prior MRI from earlier the same day. FINDINGS: Brain: Limited exam with diffusion-weighted sequences, FLAIR sequence, and SWI sequence was performed. Previously identified 4 mm acute ischemic infarct involving the right caudate/corona radiata again seen, stable from prior. No associated hemorrhage or mass effect. No other new or interval infarction. Gray-white matter differentiation otherwise maintained. No acute or chronic intracranial blood products. Mild chronic microvascular ischemic disease noted. No mass lesion or midline shift. No hydrocephalus or extra-axial fluid collection. Vascular: Major intracranial vascular flow voids are grossly maintained on this limited exam. Skull and upper cervical spine: No new finding. Sinuses/Orbits: No new finding. Other: None. IMPRESSION: 1. Stable 4 mm acute ischemic infarct involving the right caudate/corona radiata. No associated hemorrhage or mass effect. 2. No other new or acute intracranial abnormality. Electronically Signed   By: Janell Quiet.D.  On: 03/03/2023 06:38   ECHOCARDIOGRAM COMPLETE  Result Date: 03/02/2023    ECHOCARDIOGRAM REPORT   Patient Name:   SY SCHLOTTERBECK Date of Exam: 03/02/2023 Medical Rec #:  [id number]        Height:       68.0 in Accession #:    [id number]       Weight:       179.0 lb Date of Birth:  [birth year]         BSA:          1.950 m Patient Age:    72 years         BP:           128/79 mmHg Patient Gender: M                HR:           73 bpm. Exam Location:   Inpatient Procedure: 2D Echo, Cardiac Doppler and Color Doppler Indications:    Stroke I63.9  History:        Patient has no prior history of Echocardiogram examinations.                 TIA; Risk Factors:Sleep Apnea and Hypertension.  Sonographer:    Lucendia Herrlich RCS Referring Phys: [id number] SRISHTI L BHAGAT IMPRESSIONS  1. Left ventricular ejection fraction, by estimation, is 60 to 65%. The left ventricle has normal function. The left ventricle has no regional wall motion abnormalities. Left ventricular diastolic parameters are consistent with Grade I diastolic dysfunction (impaired relaxation).  2. Right ventricular systolic function is normal. The right ventricular size is normal. Tricuspid regurgitation signal is inadequate for assessing PA pressure.  3. The mitral valve is normal in structure. No evidence of mitral valve regurgitation. No evidence of mitral stenosis.  4. The aortic valve is tricuspid. Aortic valve regurgitation is not visualized. No aortic stenosis is present.  5. The inferior vena cava is dilated in size with >50% respiratory variability, suggesting right atrial pressure of 8 mmHg. FINDINGS  Left Ventricle: Left ventricular ejection fraction, by estimation, is 60 to 65%. The left ventricle has normal function. The left ventricle has no regional wall motion abnormalities. The left ventricular internal cavity size was normal in size. There is  no left ventricular hypertrophy. Left ventricular diastolic parameters are consistent with Grade I diastolic dysfunction (impaired relaxation). Right Ventricle: The right ventricular size is normal. No increase in right ventricular wall thickness. Right ventricular systolic function is normal. Tricuspid regurgitation signal is inadequate for assessing PA pressure. The tricuspid regurgitant velocity is 1.36 m/s, and with an assumed right atrial pressure of 8 mmHg, the estimated right ventricular systolic pressure is 15.4 mmHg. Left Atrium: Left atrial  size was normal in size. Right Atrium: Right atrial size was normal in size. Pericardium: There is no evidence of pericardial effusion. Mitral Valve: The mitral valve is normal in structure. No evidence of mitral valve regurgitation. No evidence of mitral valve stenosis. Tricuspid Valve: The tricuspid valve is normal in structure. Tricuspid valve regurgitation is trivial. Aortic Valve: The aortic valve is tricuspid. Aortic valve regurgitation is not visualized. No aortic stenosis is present. Aortic valve peak gradient measures 7.2 mmHg. Pulmonic Valve: The pulmonic valve was normal in structure. Pulmonic valve regurgitation is trivial. Aorta: The aortic root is normal in size and structure. Venous: The inferior vena cava is dilated in size with greater than 50% respiratory variability, suggesting right atrial pressure of 8 mmHg. IAS/Shunts: No  atrial level shunt detected by color flow Doppler.  LEFT VENTRICLE PLAX 2D LVIDd:         3.80 cm   Diastology LVIDs:         2.60 cm   LV e' medial:    4.79 cm/s LV PW:         1.00 cm   LV E/e' medial:  12.5 LV IVS:        0.90 cm   LV e' lateral:   6.96 cm/s LVOT diam:     2.20 cm   LV E/e' lateral: 8.6 LV SV:         68 LV SV Index:   35 LVOT Area:     3.80 cm  RIGHT VENTRICLE             IVC RV S prime:     17.50 cm/s  IVC diam: 2.40 cm TAPSE (M-mode): 1.4 cm LEFT ATRIUM             Index        RIGHT ATRIUM           Index LA diam:        4.20 cm 2.15 cm/m   RA Area:     10.60 cm LA Vol (A2C):   29.7 ml 15.23 ml/m  RA Volume:   16.20 ml  8.31 ml/m LA Vol (A4C):   23.2 ml 11.90 ml/m LA Biplane Vol: 26.6 ml 13.64 ml/m  AORTIC VALVE AV Area (Vmax): 3.14 cm AV Vmax:        134.00 cm/s AV Peak Grad:   7.2 mmHg LVOT Vmax:      110.60 cm/s LVOT Vmean:     69.320 cm/s LVOT VTI:       0.180 m  AORTA Ao Root diam: 3.50 cm Ao Asc diam:  3.50 cm MITRAL VALVE               TRICUSPID VALVE MV Area (PHT): 2.80 cm    TR Peak grad:   7.4 mmHg MV Decel Time: 271 msec    TR Vmax:         136.00 cm/s MV E velocity: 59.70 cm/s MV A velocity: 84.40 cm/s  SHUNTS MV E/A ratio:  0.71        Systemic VTI:  0.18 m                            Systemic Diam: 2.20 cm Dalton McleanMD Electronically signed by Wilfred Lacy Signature Date/Time: 03/02/2023/3:00:45 PM    Final    MR BRAIN WO CONTRAST  Result Date: 03/02/2023 CLINICAL DATA:  Provided history: Neuro deficit, acute, stroke suspected. Non-contrast head CT EXAM: MRI HEAD WITHOUT CONTRAST TECHNIQUE: Multiplanar, multiecho pulse sequences of the brain and surrounding structures were obtained without intravenous contrast. COMPARISON:  And CT angiogram head/neck 03/01/2023. FINDINGS: Brain: Mild generalized cerebral volume loss. 4 mm acute infarct within the right caudate nucleus/corona radiata (series 3, image 34) (series 4, image 22). Small chronic lacunar infarct within the right lentiform nucleus/anterior limb of right internal capsule (series 7, image 20). Background mild multifocal T2 FLAIR hyperintense signal abnormality within the cerebral white matter and pons, nonspecific but compatible with chronic small vessel ischemic disease. No evidence of an intracranial mass. No chronic intracranial blood products. No extra-axial fluid collection. No midline shift. Vascular: Maintained flow voids within the proximal large arterial vessels. Skull and upper cervical spine: No  focal worrisome marrow lesion. Sinuses/Orbits: No mass or acute finding within the imaged orbits. Minimal mucosal thickening within bilateral ethmoid air cells. Impression #1 will be called to the ordering clinician or representative by the Radiologist Assistant, and communication documented in the PACS or [employer]. IMPRESSION: 1. 4 mm acute infarct within the right caudate nucleus/corona radiata. 2. Small chronic lacunar infarct within the right lentiform nucleus/anterior limb of right internal capsule. 3. Mild background chronic small vessel ischemic changes within  the cerebral white matter and pons. 4. Mild generalized cerebral atrophy. Electronically Signed   By: Jackey Loge D.O.   On: 03/02/2023 11:51   CT ANGIO HEAD NECK W WO CM  Result Date: 03/01/2023 CLINICAL DATA:  Left arm and leg numbness, tingling to left side of face EXAM: CT ANGIOGRAPHY HEAD AND NECK WITH AND WITHOUT CONTRAST TECHNIQUE: Multidetector CT imaging of the head and neck was performed using the standard protocol during bolus administration of intravenous contrast. Multiplanar CT image reconstructions and MIPs were obtained to evaluate the vascular anatomy. Carotid stenosis measurements (when applicable) are obtained utilizing NASCET criteria, using the distal internal carotid diameter as the denominator. RADIATION DOSE REDUCTION: This exam was performed according to the departmental dose-optimization program which includes automated exposure control, adjustment of the mA and/or kV according to patient size and/or use of iterative reconstruction technique. CONTRAST:  75mL OMNIPAQUE IOHEXOL 350 MG/ML SOLN COMPARISON:  No prior CTA available, correlation is made with CT head 03/01/2023 FINDINGS: CT HEAD FINDINGS For noncontrast findings, please see same day CT head. CTA NECK FINDINGS Aortic arch: Standard branching. Imaged portion shows no evidence of aneurysm or dissection. No significant stenosis of the major arch vessel origins. Right carotid system: No evidence of dissection, occlusion, or hemodynamically significant stenosis (greater than 50%). Left carotid system: No evidence of dissection, occlusion, or hemodynamically significant stenosis (greater than 50%). Vertebral arteries: No evidence of dissection, occlusion, or hemodynamically significant stenosis (greater than 50%). Skeleton: No acute osseous abnormality. Degenerative changes in the cervical spine. Other neck: No acute finding. Upper chest: No focal pulmonary opacity or pleural effusion. Review of the MIP images confirms the above  findings CTA HEAD FINDINGS Anterior circulation: Both internal carotid arteries are patent to the termini, without significant stenosis. A1 segments patent. Normal anterior communicating artery. Anterior cerebral arteries are patent to their distal aspects without significant stenosis. No M1 stenosis or occlusion. MCA branches perfused to their distal aspects without significant stenosis. Posterior circulation: Vertebral arteries patent to the vertebrobasilar junction without significant stenosis. Posterior inferior cerebellar arteries patent proximally. Basilar patent to its distal aspect without significant stenosis. Superior cerebellar arteries patent proximally. Patent P1 segments. PCAs perfused to their distal aspects without significant stenosis. The bilateral posterior communicating arteries are not visualized. Venous sinuses: As permitted by contrast timing, patent. Anatomic variants: None significant. No evidence of aneurysm or vascular malformation. Review of the MIP images confirms the above findings IMPRESSION: 1. No intracranial large vessel occlusion or significant stenosis. 2. No hemodynamically significant stenosis in the neck. Electronically Signed   By: Wiliam Ke M.D.   On: 03/01/2023 20:40   CT HEAD CODE STROKE WO CONTRAST  Result Date: 03/01/2023 CLINICAL DATA:  Code stroke.  Left-sided weakness EXAM: CT HEAD WITHOUT CONTRAST TECHNIQUE: Contiguous axial images were obtained from the base of the skull through the vertex without intravenous contrast. RADIATION DOSE REDUCTION: This exam was performed according to the departmental dose-optimization program which includes automated exposure control, adjustment of the mA and/or kV  according to patient size and/or use of iterative reconstruction technique. COMPARISON:  None Available. FINDINGS: Brain: No hemorrhage. No hydrocephalus. No extra-axial fluid collection. No mass effect. No mass lesion. CT evidence of an acute cortical infarct.  Vascular: No hyperdense vessel or unexpected calcification. Skull: Normal. Negative for fracture or focal lesion. Sinuses/Orbits: No middle ear or mastoid effusion. Paranasal sinuses are clear. Orbits are unremarkable. Other: None. ASPECTS [facility] Stroke Program Early CT Score): 10 IMPRESSION: No hemorrhage or CT evidence of an acute cortical infarct. ASPECTS 10. Discussed with Dr. Suezanne Jacquet on 03/01/23 at 6:47 PM. Electronically Signed   By: Lorenza Cambridge M.D.   On: 03/01/2023 18:47     PHYSICAL EXAM  Temp:  [97.3 F (36.3 C)-98.2 F (36.8 C)] 97.8 F (36.6 C) (11/26 1149) Pulse Rate:  [57-80] 70 (11/26 1149) Resp:  [12-24] 14 (11/26 1149) BP: (114-145)/(76-105) 134/80 (11/26 1149) SpO2:  [95 %] 95 % (11/25 2239)  General - Well nourished, well developed, in no apparent distress.  Ophthalmologic - fundi not visualized due to noncooperation.  Cardiovascular - Regular rhythm and rate.  Mental Status -  Level of arousal and orientation to time, place, and person were intact. Language including expression, naming, repetition, comprehension was assessed and found intact. Attention span and concentration were normal. Fund of Knowledge was assessed and was intact.  Cranial Nerves II - XII - II - Visual field intact OU. III, IV, VI - Extraocular movements intact. V - Facial sensation intact bilaterally. VII - Facial movement intact bilaterally. VIII - Hearing & vestibular intact bilaterally. X - Palate elevates symmetrically. XI - Chin turning & shoulder shrug intact bilaterally. XII - Tongue protrusion intact.  Motor Strength - The patient's strength was normal in all extremities and pronator drift was absent.  Bulk was normal and fasciculations were absent.   Motor Tone - Muscle tone was assessed at the neck and appendages and was normal.  Reflexes - The patient's reflexes were symmetrical in all extremities and he had no pathological reflexes.  Sensory - Light touch,  temperature/pinprick were assessed and were symmetrical.    Coordination - The patient had normal movements in the hands and feet with no ataxia or dysmetria.  Tremor was absent.  Gait and Station - deferred.   ASSESSMENT/PLAN Mr. David [name] is a 72 y.o. male with history of HTN, OSA, remote ulcerative colitis s/p total colectomy many years ago with ileostomy in place, left kidney cancer, CKD stage IIIa, peripheral neuropathy admitted for left sided tingling and dyscoordination. No tPA given due to outside window.    Stroke:  right lacunar infarct at CR, likely secondary to small vessel disease source.  Recrudescence of stroke symptoms due to hypotension CT no acute finding CTA head and neck unremarkable MRI  4 mm acute infarct within the right caudate nucleus/corona radiata. Small chronic lacunar infarct within the right lentiform nucleus/anterior limb of right internal capsule. MRI repeat Stable 4 mm acute ischemic infarct involving the right caudate/corona radiata. No new abnormalities 2D Echo  EF 60-65% LDL 91 HgbA1c 5.5 UDS neg lovenox for VTE prophylaxis No antithrombotic prior to admission, now on aspirin 81 mg daily and clopidogrel 75 mg daily DAPT for 3 weeks and then ASA alone Ongoing aggressive stroke risk factor management Therapy recommendations:  none Disposition:  home  Hx of hypertension Hypotension likely due to dehydration Low last night - received IV bolus and IVF Orthostatic vital unremarkable Long term BP goal normotensive  Hyperlipidemia Home meds:  none  LDL pending, goal < 70 Hx of cretor 20 intolerance but willing to re-challenge with crestor 10 Continue statin at discharge  Other Stroke Risk Factors Advanced age Obstructive sleep apnea Hx of Clomid use, stopped 2 months ago On testosterone, pt encourage to discuss with PCP.    Other Active Problems CKD 3a, Cre 1.34--1.27 Mild leukocytosis WBC 11.0--8.7  Hospital day # 0  Neurology will  sign off. Please call with questions. Pt will follow up with [facility] per pt preference in about 4 weeks. Thanks for the consult.   Marvel Plan, MD PhD Stroke Neurology 03/03/2023 5:14 PM    To contact Stroke Continuity provider, please refer to [email or web address]. After hours, contact General Neurology

## 2023-03-03 NOTE — Progress Notes (Signed)
Mobility Specialist Progress Note;   03/03/23 1045  Mobility  Activity Ambulated with assistance in hallway  Level of Assistance Modified independent, requires aide device or extra time  Assistive Device Other (Comment) (Handrails in hall)  Distance Ambulated (ft) 500 ft  Activity Response Tolerated well  Mobility Referral Yes  $Mobility charge 1 Mobility  Mobility Specialist Start Time (ACUTE ONLY) 1045  Mobility Specialist Stop Time (ACUTE ONLY) 1100  Mobility Specialist Time Calculation (min) (ACUTE ONLY) 15 min   Pt agreeable to mobility. Required no physical assistance during ambulation, ModI. While ambulating, used handrails in hall for extra stability. Asx throughout session. Pt returned back to bed with all needs met.   David [name] Mobility Specialist Please contact via SecureChat or Rehab Office [phone]

## 2023-03-03 NOTE — Progress Notes (Signed)
Pt has returned from MRI.

## 2023-03-03 NOTE — Discharge Summary (Signed)
PATIENT DETAILS Name: David [name] Age: 72 y.o. Sex: male Date of Birth: 1950-06-27 MRN: [id number]. Admitting Physician: Joycelyn Das, MD VZD:GLOV, Darlen Round, MD  Admit Date: 03/01/2023 Discharge date: 03/03/2023  Recommendations for Outpatient Follow-up:  Follow up with PCP in 1-2 weeks Please obtain CMP/CBC in one week Please ensure follow-up with stroke clinic.  Admitted From:  Home  Disposition: Home   Discharge Condition: good  CODE STATUS:   Code Status: Full Code   Diet recommendation:  Diet Order             Diet Heart Room service appropriate? Yes; Fluid consistency: Thin  Diet effective now           Diet - low sodium heart healthy                    Brief Summary: 72 year old with history of HTN, ulcerative colitis-s/p total colectomy-ostomy in place-presented with left-sided tingling/numbness-found to have acute CVA.  Significant studies MRI brain: 4 mm acute infarct right caudate nucleus TTE: EF 60-65% CTA head/neck: No stenosis A1c: 5.5 LDL: 91  Brief Hospital Course: Acute right caudate nucleus infarct Secondary to small vessel disease Workup as above Recommendations are for aspirin/Plavix x 3 weeks followed by aspirin alone  Orthostatic hypotension On 11/25-patient developed recurrent left-sided numbness-blood pressure was soft Repeat MRI on 11/25 did not show any extension of CVA Patient was gently hydrated Blood pressure this morning stable-negative orthostatic vital signs Plan is to discharge home today-continue to hold antihypertensive agents for the next several days and resume later this week.  HTN Resume lisinopril Friday  HLD Crestor  Hypothyroidism Synthroid TSH 11/25-stable  CKD stage IIIa History of partial left nephrectomy Creatinine close to baseline  Ulcerative colitis-s/p colectomy Stable  OSA CPAP  Discharge Diagnoses:  Principal Problem:   Stroke-like symptoms   Discharge  Instructions:  Activity:  As tolerated   Discharge Instructions     Ambulatory referral to Neurology   Complete by: As directed    Follow up with [facility] in about 4 weeks per pt preference. Thanks.   Diet - low sodium heart healthy   Complete by: As directed    Discharge instructions   Complete by: As directed    Follow with Primary MD  Daisy Floro, MD in 1-2 weeks  Aspirin/Plavix x 3 weeks-then stop Plavix and continue aspirin.  Please get a complete blood count and chemistry panel checked by your Primary MD at your next visit, and again as instructed by your Primary MD.  Get Medicines reviewed and adjusted: Please take all your medications with you for your next visit with your Primary MD  Laboratory/radiological data: Please request your Primary MD to go over all hospital tests and procedure/radiological results at the follow up, please ask your Primary MD to get all Hospital records sent to his/her office.  In some cases, they will be blood work, cultures and biopsy results pending at the time of your discharge. Please request that your primary care M.D. follows up on these results.  Also Note the following: If you experience worsening of your admission symptoms, develop shortness of breath, life threatening emergency, suicidal or homicidal thoughts you must seek medical attention immediately by calling 911 or calling your MD immediately  if symptoms less severe.  You must read complete instructions/literature along with all the possible adverse reactions/side effects for all the Medicines you take and that have been prescribed to you. Take any new Medicines  after you have completely understood and accpet all the possible adverse reactions/side effects.   Do not drive when taking Pain medications or sleeping medications (Benzodaizepines)  Do not take more than prescribed Pain, Sleep and Anxiety Medications. It is not advisable to combine anxiety,sleep and pain medications  without talking with your primary care practitioner  Special Instructions: If you have smoked or chewed Tobacco  in the last 2 yrs please stop smoking, stop any regular Alcohol  and or any Recreational drug use.  Wear Seat belts while driving.  Please note: You were cared for by a hospitalist during your hospital stay. Once you are discharged, your primary care physician will handle any further medical issues. Please note that NO REFILLS for any discharge medications will be authorized once you are discharged, as it is imperative that you return to your primary care physician (or establish a relationship with a primary care physician if you do not have one) for your post hospital discharge needs so that they can reassess your need for medications and monitor your lab values.   Increase activity slowly   Complete by: As directed       Allergies as of 03/03/2023   No Known Allergies      Medication List     STOP taking these medications    ibuprofen 200 MG tablet Commonly known as: ADVIL       TAKE these medications    acetaminophen 500 MG tablet Commonly known as: TYLENOL Take 500 mg by mouth daily as needed for moderate pain (pain score 4-6), fever or headache.   aspirin 81 MG chewable tablet Chew 1 tablet (81 mg total) by mouth daily.   b complex vitamins capsule Take 1 capsule by mouth daily.   clopidogrel 75 MG tablet Commonly known as: PLAVIX Take 1 tablet (75 mg total) by mouth daily for 21 days.   DULoxetine 30 MG capsule Commonly known as: CYMBALTA Take 30 mg by mouth every evening.   FISH OIL PO Take 2 capsules by mouth daily.   GLUCOSAMINE CHONDR COMPLEX PO Take 2 tablets by mouth daily.   levothyroxine 112 MCG tablet Commonly known as: SYNTHROID Take 112 mcg by mouth daily before breakfast.   lisinopril 10 MG tablet Commonly known as: ZESTRIL Take 1 tablet (10 mg total) by mouth daily. Start taking on: March 06, 2023 What changed: These  instructions start on March 06, 2023. If you are unsure what to do until then, ask your doctor or other care provider.   Multivitamin Men 50+ Tabs Take 1 tablet by mouth daily.   rosuvastatin 10 MG tablet Commonly known as: Crestor Take 1 tablet (10 mg total) by mouth daily.   TESTOSTERONE IM Inject 0.375 mLs into the muscle every Thursday.   VITAMIN C PO Take 1 tablet by mouth daily.        Follow-up Information     Daisy Floro, MD Follow up in 1 week(s).   Specialty: Family Medicine Contact information: [address] [place] [address] [phone]         [facility]. Schedule an appointment as soon as possible for a visit in 4 week(s).   Why: Office will call with date/time, If you dont hear from them,please give them a call Contact information: [address] [place], [address] [place] [address] [phone]               No Known Allergies   Other Procedures/Studies: MR BRAIN WO  CONTRAST  Result Date: 03/03/2023 CLINICAL DATA:  Follow-up examination for stroke. EXAM: MRI HEAD WITHOUT CONTRAST TECHNIQUE: Multiplanar, multiecho pulse sequences of the brain and surrounding structures were obtained without intravenous contrast. COMPARISON:  Prior MRI from earlier the same day. FINDINGS: Brain: Limited exam with diffusion-weighted sequences, FLAIR sequence, and SWI sequence was performed. Previously identified 4 mm acute ischemic infarct involving the right caudate/corona radiata again seen, stable from prior. No associated hemorrhage or mass effect. No other new or interval infarction. Gray-white matter differentiation otherwise maintained. No acute or chronic intracranial blood products. Mild chronic microvascular ischemic disease noted. No mass lesion or midline shift. No hydrocephalus or extra-axial fluid collection. Vascular: Major intracranial vascular flow voids are grossly maintained on this limited exam. Skull and  upper cervical spine: No new finding. Sinuses/Orbits: No new finding. Other: None. IMPRESSION: 1. Stable 4 mm acute ischemic infarct involving the right caudate/corona radiata. No associated hemorrhage or mass effect. 2. No other new or acute intracranial abnormality. Electronically Signed   By: Rise Mu M.D.   On: 03/03/2023 06:38   ECHOCARDIOGRAM COMPLETE  Result Date: 03/02/2023    ECHOCARDIOGRAM REPORT   Patient Name:   KAMAAL RAYNOR Date of Exam: 03/02/2023 Medical Rec #:  [id number]        Height:       68.0 in Accession #:    [id number]       Weight:       179.0 lb Date of Birth:  1950/12/04         BSA:          1.950 m Patient Age:    72 years         BP:           128/79 mmHg Patient Gender: M                HR:           73 bpm. Exam Location:  Inpatient Procedure: 2D Echo, Cardiac Doppler and Color Doppler Indications:    Stroke I63.9  History:        Patient has no prior history of Echocardiogram examinations.                 TIA; Risk Factors:Sleep Apnea and Hypertension.  Sonographer:    Lucendia Herrlich RCS Referring Phys: [id number] SRISHTI L BHAGAT IMPRESSIONS  1. Left ventricular ejection fraction, by estimation, is 60 to 65%. The left ventricle has normal function. The left ventricle has no regional wall motion abnormalities. Left ventricular diastolic parameters are consistent with Grade I diastolic dysfunction (impaired relaxation).  2. Right ventricular systolic function is normal. The right ventricular size is normal. Tricuspid regurgitation signal is inadequate for assessing PA pressure.  3. The mitral valve is normal in structure. No evidence of mitral valve regurgitation. No evidence of mitral stenosis.  4. The aortic valve is tricuspid. Aortic valve regurgitation is not visualized. No aortic stenosis is present.  5. The inferior vena cava is dilated in size with >50% respiratory variability, suggesting right atrial pressure of 8 mmHg. FINDINGS  Left Ventricle: Left  ventricular ejection fraction, by estimation, is 60 to 65%. The left ventricle has normal function. The left ventricle has no regional wall motion abnormalities. The left ventricular internal cavity size was normal in size. There is  no left ventricular hypertrophy. Left ventricular diastolic parameters are consistent with Grade I diastolic dysfunction (impaired relaxation). Right Ventricle: The right ventricular size is normal. No increase  in right ventricular wall thickness. Right ventricular systolic function is normal. Tricuspid regurgitation signal is inadequate for assessing PA pressure. The tricuspid regurgitant velocity is 1.36 m/s, and with an assumed right atrial pressure of 8 mmHg, the estimated right ventricular systolic pressure is 15.4 mmHg. Left Atrium: Left atrial size was normal in size. Right Atrium: Right atrial size was normal in size. Pericardium: There is no evidence of pericardial effusion. Mitral Valve: The mitral valve is normal in structure. No evidence of mitral valve regurgitation. No evidence of mitral valve stenosis. Tricuspid Valve: The tricuspid valve is normal in structure. Tricuspid valve regurgitation is trivial. Aortic Valve: The aortic valve is tricuspid. Aortic valve regurgitation is not visualized. No aortic stenosis is present. Aortic valve peak gradient measures 7.2 mmHg. Pulmonic Valve: The pulmonic valve was normal in structure. Pulmonic valve regurgitation is trivial. Aorta: The aortic root is normal in size and structure. Venous: The inferior vena cava is dilated in size with greater than 50% respiratory variability, suggesting right atrial pressure of 8 mmHg. IAS/Shunts: No atrial level shunt detected by color flow Doppler.  LEFT VENTRICLE PLAX 2D LVIDd:         3.80 cm   Diastology LVIDs:         2.60 cm   LV e' medial:    4.79 cm/s LV PW:         1.00 cm   LV E/e' medial:  12.5 LV IVS:        0.90 cm   LV e' lateral:   6.96 cm/s LVOT diam:     2.20 cm   LV E/e'  lateral: 8.6 LV SV:         68 LV SV Index:   35 LVOT Area:     3.80 cm  RIGHT VENTRICLE             IVC RV S prime:     17.50 cm/s  IVC diam: 2.40 cm TAPSE (M-mode): 1.4 cm LEFT ATRIUM             Index        RIGHT ATRIUM           Index LA diam:        4.20 cm 2.15 cm/m   RA Area:     10.60 cm LA Vol (A2C):   29.7 ml 15.23 ml/m  RA Volume:   16.20 ml  8.31 ml/m LA Vol (A4C):   23.2 ml 11.90 ml/m LA Biplane Vol: 26.6 ml 13.64 ml/m  AORTIC VALVE AV Area (Vmax): 3.14 cm AV Vmax:        134.00 cm/s AV Peak Grad:   7.2 mmHg LVOT Vmax:      110.60 cm/s LVOT Vmean:     69.320 cm/s LVOT VTI:       0.180 m  AORTA Ao Root diam: 3.50 cm Ao Asc diam:  3.50 cm MITRAL VALVE               TRICUSPID VALVE MV Area (PHT): 2.80 cm    TR Peak grad:   7.4 mmHg MV Decel Time: 271 msec    TR Vmax:        136.00 cm/s MV E velocity: 59.70 cm/s MV A velocity: 84.40 cm/s  SHUNTS MV E/A ratio:  0.71        Systemic VTI:  0.18 m  Systemic Diam: 2.20 cm Dalton McleanMD Electronically signed by Wilfred Lacy Signature Date/Time: 03/02/2023/3:00:45 PM    Final    MR BRAIN WO CONTRAST  Result Date: 03/02/2023 CLINICAL DATA:  Provided history: Neuro deficit, acute, stroke suspected. Non-contrast head CT EXAM: MRI HEAD WITHOUT CONTRAST TECHNIQUE: Multiplanar, multiecho pulse sequences of the brain and surrounding structures were obtained without intravenous contrast. COMPARISON:  And CT angiogram head/neck 03/01/2023. FINDINGS: Brain: Mild generalized cerebral volume loss. 4 mm acute infarct within the right caudate nucleus/corona radiata (series 3, image 34) (series 4, image 22). Small chronic lacunar infarct within the right lentiform nucleus/anterior limb of right internal capsule (series 7, image 20). Background mild multifocal T2 FLAIR hyperintense signal abnormality within the cerebral white matter and pons, nonspecific but compatible with chronic small vessel ischemic disease. No evidence of an  intracranial mass. No chronic intracranial blood products. No extra-axial fluid collection. No midline shift. Vascular: Maintained flow voids within the proximal large arterial vessels. Skull and upper cervical spine: No focal worrisome marrow lesion. Sinuses/Orbits: No mass or acute finding within the imaged orbits. Minimal mucosal thickening within bilateral ethmoid air cells. Impression #1 will be called to the ordering clinician or representative by the Radiologist Assistant, and communication documented in the PACS or [employer]. IMPRESSION: 1. 4 mm acute infarct within the right caudate nucleus/corona radiata. 2. Small chronic lacunar infarct within the right lentiform nucleus/anterior limb of right internal capsule. 3. Mild background chronic small vessel ischemic changes within the cerebral white matter and pons. 4. Mild generalized cerebral atrophy. Electronically Signed   By: Jackey Loge D.O.   On: 03/02/2023 11:51   CT ANGIO HEAD NECK W WO CM  Result Date: 03/01/2023 CLINICAL DATA:  Left arm and leg numbness, tingling to left side of face EXAM: CT ANGIOGRAPHY HEAD AND NECK WITH AND WITHOUT CONTRAST TECHNIQUE: Multidetector CT imaging of the head and neck was performed using the standard protocol during bolus administration of intravenous contrast. Multiplanar CT image reconstructions and MIPs were obtained to evaluate the vascular anatomy. Carotid stenosis measurements (when applicable) are obtained utilizing NASCET criteria, using the distal internal carotid diameter as the denominator. RADIATION DOSE REDUCTION: This exam was performed according to the departmental dose-optimization program which includes automated exposure control, adjustment of the mA and/or kV according to patient size and/or use of iterative reconstruction technique. CONTRAST:  75mL OMNIPAQUE IOHEXOL 350 MG/ML SOLN COMPARISON:  No prior CTA available, correlation is made with CT head 03/01/2023 FINDINGS: CT HEAD FINDINGS  For noncontrast findings, please see same day CT head. CTA NECK FINDINGS Aortic arch: Standard branching. Imaged portion shows no evidence of aneurysm or dissection. No significant stenosis of the major arch vessel origins. Right carotid system: No evidence of dissection, occlusion, or hemodynamically significant stenosis (greater than 50%). Left carotid system: No evidence of dissection, occlusion, or hemodynamically significant stenosis (greater than 50%). Vertebral arteries: No evidence of dissection, occlusion, or hemodynamically significant stenosis (greater than 50%). Skeleton: No acute osseous abnormality. Degenerative changes in the cervical spine. Other neck: No acute finding. Upper chest: No focal pulmonary opacity or pleural effusion. Review of the MIP images confirms the above findings CTA HEAD FINDINGS Anterior circulation: Both internal carotid arteries are patent to the termini, without significant stenosis. A1 segments patent. Normal anterior communicating artery. Anterior cerebral arteries are patent to their distal aspects without significant stenosis. No M1 stenosis or occlusion. MCA branches perfused to their distal aspects without significant stenosis. Posterior circulation: Vertebral arteries patent to the  vertebrobasilar junction without significant stenosis. Posterior inferior cerebellar arteries patent proximally. Basilar patent to its distal aspect without significant stenosis. Superior cerebellar arteries patent proximally. Patent P1 segments. PCAs perfused to their distal aspects without significant stenosis. The bilateral posterior communicating arteries are not visualized. Venous sinuses: As permitted by contrast timing, patent. Anatomic variants: None significant. No evidence of aneurysm or vascular malformation. Review of the MIP images confirms the above findings IMPRESSION: 1. No intracranial large vessel occlusion or significant stenosis. 2. No hemodynamically significant stenosis  in the neck. Electronically Signed   By: Wiliam Ke M.D.   On: 03/01/2023 20:40   CT HEAD CODE STROKE WO CONTRAST  Result Date: 03/01/2023 CLINICAL DATA:  Code stroke.  Left-sided weakness EXAM: CT HEAD WITHOUT CONTRAST TECHNIQUE: Contiguous axial images were obtained from the base of the skull through the vertex without intravenous contrast. RADIATION DOSE REDUCTION: This exam was performed according to the departmental dose-optimization program which includes automated exposure control, adjustment of the mA and/or kV according to patient size and/or use of iterative reconstruction technique. COMPARISON:  None Available. FINDINGS: Brain: No hemorrhage. No hydrocephalus. No extra-axial fluid collection. No mass effect. No mass lesion. CT evidence of an acute cortical infarct. Vascular: No hyperdense vessel or unexpected calcification. Skull: Normal. Negative for fracture or focal lesion. Sinuses/Orbits: No middle ear or mastoid effusion. Paranasal sinuses are clear. Orbits are unremarkable. Other: None. ASPECTS [facility] Stroke Program Early CT Score): 10 IMPRESSION: No hemorrhage or CT evidence of an acute cortical infarct. ASPECTS 10. Discussed with Dr. Suezanne Jacquet on 03/01/23 at 6:47 PM. Electronically Signed   By: Lorenza Cambridge M.D.   On: 03/01/2023 18:47     TODAY-DAY OF DISCHARGE:  Subjective:   David [name] today has no headache,no chest abdominal pain,no new weakness tingling or numbness, feels much better wants to go home today.  Objective:   Blood pressure (!) 139/91, pulse 66, temperature (!) 97.3 F (36.3 C), temperature source Oral, resp. rate 20, SpO2 95%.  Intake/Output Summary (Last 24 hours) at 03/03/2023 1048 Last data filed at 03/03/2023 1610 Gross per 24 hour  Intake 544.47 ml  Output 400 ml  Net 144.47 ml   There were no vitals filed for this visit.  Exam: Awake Alert, Oriented *3, No new F.N deficits, Normal affect [place].AT,PERRAL Supple Neck,No JVD, No cervical  lymphadenopathy appriciated.  Symmetrical Chest wall movement, Good air movement bilaterally, CTAB RRR,No Gallops,Rubs or new Murmurs, No Parasternal Heave +ve B.Sounds, Abd Soft, Non tender, No organomegaly appriciated, No rebound -guarding or rigidity. No Cyanosis, Clubbing or edema, No new Rash or bruise   PERTINENT RADIOLOGIC STUDIES: MR BRAIN WO CONTRAST  Result Date: 03/03/2023 CLINICAL DATA:  Follow-up examination for stroke. EXAM: MRI HEAD WITHOUT CONTRAST TECHNIQUE: Multiplanar, multiecho pulse sequences of the brain and surrounding structures were obtained without intravenous contrast. COMPARISON:  Prior MRI from earlier the same day. FINDINGS: Brain: Limited exam with diffusion-weighted sequences, FLAIR sequence, and SWI sequence was performed. Previously identified 4 mm acute ischemic infarct involving the right caudate/corona radiata again seen, stable from prior. No associated hemorrhage or mass effect. No other new or interval infarction. Gray-white matter differentiation otherwise maintained. No acute or chronic intracranial blood products. Mild chronic microvascular ischemic disease noted. No mass lesion or midline shift. No hydrocephalus or extra-axial fluid collection. Vascular: Major intracranial vascular flow voids are grossly maintained on this limited exam. Skull and upper cervical spine: No new finding. Sinuses/Orbits: No new finding. Other: None. IMPRESSION: 1. Stable 4  mm acute ischemic infarct involving the right caudate/corona radiata. No associated hemorrhage or mass effect. 2. No other new or acute intracranial abnormality. Electronically Signed   By: Rise Mu M.D.   On: 03/03/2023 06:38   ECHOCARDIOGRAM COMPLETE  Result Date: 03/02/2023    ECHOCARDIOGRAM REPORT   Patient Name:   David [name] Date of Exam: 03/02/2023 Medical Rec #:  [id number]        Height:       68.0 in Accession #:    [id number]       Weight:       179.0 lb Date of Birth:  [birth year]          BSA:          1.950 m Patient Age:    72 years         BP:           128/79 mmHg Patient Gender: M                HR:           73 bpm. Exam Location:  Inpatient Procedure: 2D Echo, Cardiac Doppler and Color Doppler Indications:    Stroke I63.9  History:        Patient has no prior history of Echocardiogram examinations.                 TIA; Risk Factors:Sleep Apnea and Hypertension.  Sonographer:    Lucendia Herrlich RCS Referring Phys: [id number] SRISHTI L BHAGAT IMPRESSIONS  1. Left ventricular ejection fraction, by estimation, is 60 to 65%. The left ventricle has normal function. The left ventricle has no regional wall motion abnormalities. Left ventricular diastolic parameters are consistent with Grade I diastolic dysfunction (impaired relaxation).  2. Right ventricular systolic function is normal. The right ventricular size is normal. Tricuspid regurgitation signal is inadequate for assessing PA pressure.  3. The mitral valve is normal in structure. No evidence of mitral valve regurgitation. No evidence of mitral stenosis.  4. The aortic valve is tricuspid. Aortic valve regurgitation is not visualized. No aortic stenosis is present.  5. The inferior vena cava is dilated in size with >50% respiratory variability, suggesting right atrial pressure of 8 mmHg. FINDINGS  Left Ventricle: Left ventricular ejection fraction, by estimation, is 60 to 65%. The left ventricle has normal function. The left ventricle has no regional wall motion abnormalities. The left ventricular internal cavity size was normal in size. There is  no left ventricular hypertrophy. Left ventricular diastolic parameters are consistent with Grade I diastolic dysfunction (impaired relaxation). Right Ventricle: The right ventricular size is normal. No increase in right ventricular wall thickness. Right ventricular systolic function is normal. Tricuspid regurgitation signal is inadequate for assessing PA pressure. The tricuspid regurgitant velocity  is 1.36 m/s, and with an assumed right atrial pressure of 8 mmHg, the estimated right ventricular systolic pressure is 15.4 mmHg. Left Atrium: Left atrial size was normal in size. Right Atrium: Right atrial size was normal in size. Pericardium: There is no evidence of pericardial effusion. Mitral Valve: The mitral valve is normal in structure. No evidence of mitral valve regurgitation. No evidence of mitral valve stenosis. Tricuspid Valve: The tricuspid valve is normal in structure. Tricuspid valve regurgitation is trivial. Aortic Valve: The aortic valve is tricuspid. Aortic valve regurgitation is not visualized. No aortic stenosis is present. Aortic valve peak gradient measures 7.2 mmHg. Pulmonic Valve: The pulmonic valve was normal in structure. Pulmonic valve regurgitation is trivial.  Aorta: The aortic root is normal in size and structure. Venous: The inferior vena cava is dilated in size with greater than 50% respiratory variability, suggesting right atrial pressure of 8 mmHg. IAS/Shunts: No atrial level shunt detected by color flow Doppler.  LEFT VENTRICLE PLAX 2D LVIDd:         3.80 cm   Diastology LVIDs:         2.60 cm   LV e' medial:    4.79 cm/s LV PW:         1.00 cm   LV E/e' medial:  12.5 LV IVS:        0.90 cm   LV e' lateral:   6.96 cm/s LVOT diam:     2.20 cm   LV E/e' lateral: 8.6 LV SV:         68 LV SV Index:   35 LVOT Area:     3.80 cm  RIGHT VENTRICLE             IVC RV S prime:     17.50 cm/s  IVC diam: 2.40 cm TAPSE (M-mode): 1.4 cm LEFT ATRIUM             Index        RIGHT ATRIUM           Index LA diam:        4.20 cm 2.15 cm/m   RA Area:     10.60 cm LA Vol (A2C):   29.7 ml 15.23 ml/m  RA Volume:   16.20 ml  8.31 ml/m LA Vol (A4C):   23.2 ml 11.90 ml/m LA Biplane Vol: 26.6 ml 13.64 ml/m  AORTIC VALVE AV Area (Vmax): 3.14 cm AV Vmax:        134.00 cm/s AV Peak Grad:   7.2 mmHg LVOT Vmax:      110.60 cm/s LVOT Vmean:     69.320 cm/s LVOT VTI:       0.180 m  AORTA Ao Root diam: 3.50  cm Ao Asc diam:  3.50 cm MITRAL VALVE               TRICUSPID VALVE MV Area (PHT): 2.80 cm    TR Peak grad:   7.4 mmHg MV Decel Time: 271 msec    TR Vmax:        136.00 cm/s MV E velocity: 59.70 cm/s MV A velocity: 84.40 cm/s  SHUNTS MV E/A ratio:  0.71        Systemic VTI:  0.18 m                            Systemic Diam: 2.20 cm Dalton McleanMD Electronically signed by Wilfred Lacy Signature Date/Time: 03/02/2023/3:00:45 PM    Final    MR BRAIN WO CONTRAST  Result Date: 03/02/2023 CLINICAL DATA:  Provided history: Neuro deficit, acute, stroke suspected. Non-contrast head CT EXAM: MRI HEAD WITHOUT CONTRAST TECHNIQUE: Multiplanar, multiecho pulse sequences of the brain and surrounding structures were obtained without intravenous contrast. COMPARISON:  And CT angiogram head/neck 03/01/2023. FINDINGS: Brain: Mild generalized cerebral volume loss. 4 mm acute infarct within the right caudate nucleus/corona radiata (series 3, image 34) (series 4, image 22). Small chronic lacunar infarct within the right lentiform nucleus/anterior limb of right internal capsule (series 7, image 20). Background mild multifocal T2 FLAIR hyperintense signal abnormality within the cerebral white matter and pons, nonspecific but compatible with chronic small vessel ischemic disease.  No evidence of an intracranial mass. No chronic intracranial blood products. No extra-axial fluid collection. No midline shift. Vascular: Maintained flow voids within the proximal large arterial vessels. Skull and upper cervical spine: No focal worrisome marrow lesion. Sinuses/Orbits: No mass or acute finding within the imaged orbits. Minimal mucosal thickening within bilateral ethmoid air cells. Impression #1 will be called to the ordering clinician or representative by the Radiologist Assistant, and communication documented in the PACS or [employer]. IMPRESSION: 1. 4 mm acute infarct within the right caudate nucleus/corona radiata. 2. Small chronic  lacunar infarct within the right lentiform nucleus/anterior limb of right internal capsule. 3. Mild background chronic small vessel ischemic changes within the cerebral white matter and pons. 4. Mild generalized cerebral atrophy. Electronically Signed   By: Jackey Loge D.O.   On: 03/02/2023 11:51   CT ANGIO HEAD NECK W WO CM  Result Date: 03/01/2023 CLINICAL DATA:  Left arm and leg numbness, tingling to left side of face EXAM: CT ANGIOGRAPHY HEAD AND NECK WITH AND WITHOUT CONTRAST TECHNIQUE: Multidetector CT imaging of the head and neck was performed using the standard protocol during bolus administration of intravenous contrast. Multiplanar CT image reconstructions and MIPs were obtained to evaluate the vascular anatomy. Carotid stenosis measurements (when applicable) are obtained utilizing NASCET criteria, using the distal internal carotid diameter as the denominator. RADIATION DOSE REDUCTION: This exam was performed according to the departmental dose-optimization program which includes automated exposure control, adjustment of the mA and/or kV according to patient size and/or use of iterative reconstruction technique. CONTRAST:  75mL OMNIPAQUE IOHEXOL 350 MG/ML SOLN COMPARISON:  No prior CTA available, correlation is made with CT head 03/01/2023 FINDINGS: CT HEAD FINDINGS For noncontrast findings, please see same day CT head. CTA NECK FINDINGS Aortic arch: Standard branching. Imaged portion shows no evidence of aneurysm or dissection. No significant stenosis of the major arch vessel origins. Right carotid system: No evidence of dissection, occlusion, or hemodynamically significant stenosis (greater than 50%). Left carotid system: No evidence of dissection, occlusion, or hemodynamically significant stenosis (greater than 50%). Vertebral arteries: No evidence of dissection, occlusion, or hemodynamically significant stenosis (greater than 50%). Skeleton: No acute osseous abnormality. Degenerative changes in  the cervical spine. Other neck: No acute finding. Upper chest: No focal pulmonary opacity or pleural effusion. Review of the MIP images confirms the above findings CTA HEAD FINDINGS Anterior circulation: Both internal carotid arteries are patent to the termini, without significant stenosis. A1 segments patent. Normal anterior communicating artery. Anterior cerebral arteries are patent to their distal aspects without significant stenosis. No M1 stenosis or occlusion. MCA branches perfused to their distal aspects without significant stenosis. Posterior circulation: Vertebral arteries patent to the vertebrobasilar junction without significant stenosis. Posterior inferior cerebellar arteries patent proximally. Basilar patent to its distal aspect without significant stenosis. Superior cerebellar arteries patent proximally. Patent P1 segments. PCAs perfused to their distal aspects without significant stenosis. The bilateral posterior communicating arteries are not visualized. Venous sinuses: As permitted by contrast timing, patent. Anatomic variants: None significant. No evidence of aneurysm or vascular malformation. Review of the MIP images confirms the above findings IMPRESSION: 1. No intracranial large vessel occlusion or significant stenosis. 2. No hemodynamically significant stenosis in the neck. Electronically Signed   By: Wiliam Ke M.D.   On: 03/01/2023 20:40   CT HEAD CODE STROKE WO CONTRAST  Result Date: 03/01/2023 CLINICAL DATA:  Code stroke.  Left-sided weakness EXAM: CT HEAD WITHOUT CONTRAST TECHNIQUE: Contiguous axial images were obtained from the  base of the skull through the vertex without intravenous contrast. RADIATION DOSE REDUCTION: This exam was performed according to the departmental dose-optimization program which includes automated exposure control, adjustment of the mA and/or kV according to patient size and/or use of iterative reconstruction technique. COMPARISON:  None Available.  FINDINGS: Brain: No hemorrhage. No hydrocephalus. No extra-axial fluid collection. No mass effect. No mass lesion. CT evidence of an acute cortical infarct. Vascular: No hyperdense vessel or unexpected calcification. Skull: Normal. Negative for fracture or focal lesion. Sinuses/Orbits: No middle ear or mastoid effusion. Paranasal sinuses are clear. Orbits are unremarkable. Other: None. ASPECTS [facility] Stroke Program Early CT Score): 10 IMPRESSION: No hemorrhage or CT evidence of an acute cortical infarct. ASPECTS 10. Discussed with Dr. Suezanne Jacquet on 03/01/23 at 6:47 PM. Electronically Signed   By: Lorenza Cambridge M.D.   On: 03/01/2023 18:47     PERTINENT LAB RESULTS: CBC: Recent Labs    03/01/23 1831 03/03/23 0330  WBC 11.0* 8.7  HGB 15.1 14.7  HCT 45.0 44.0  PLT 308 253   CMET CMP     Component Value Date/Time   NA 136 03/03/2023 0330   K 4.2 03/03/2023 0330   CL 108 03/03/2023 0330   CO2 21 (L) 03/03/2023 0330   GLUCOSE 91 03/03/2023 0330   BUN 13 03/03/2023 0330   CREATININE 1.27 (H) 03/03/2023 0330   CALCIUM 9.1 03/03/2023 0330   PROT 7.1 03/01/2023 1831   ALBUMIN 3.9 03/01/2023 1831   AST 27 03/01/2023 1831   ALT 30 03/01/2023 1831   ALKPHOS 57 03/01/2023 1831   BILITOT 0.9 03/01/2023 1831   GFRNONAA >60 03/03/2023 0330    GFR CrCl cannot be calculated (Unknown ideal weight.). No results for input(s): "LIPASE", "AMYLASE" in the last 72 hours. No results for input(s): "CKTOTAL", "CKMB", "CKMBINDEX", "TROPONINI" in the last 72 hours. Invalid input(s): "POCBNP" No results for input(s): "DDIMER" in the last 72 hours. Recent Labs    03/01/23 1831  HGBA1C 5.5   Recent Labs    03/03/23 0330  CHOL 152  HDL 28*  LDLCALC 91  TRIG 161*  CHOLHDL 5.4   Recent Labs    03/02/23 1158  TSH 0.786   No results for input(s): "VITAMINB12", "FOLATE", "FERRITIN", "TIBC", "IRON", "RETICCTPCT" in the last 72 hours. Coags: Recent Labs    03/01/23 1831  INR 1.0    Microbiology: No results found for this or any previous visit (from the past 240 hour(s)).  FURTHER DISCHARGE INSTRUCTIONS:  Get Medicines reviewed and adjusted: Please take all your medications with you for your next visit with your Primary MD  Laboratory/radiological data: Please request your Primary MD to go over all hospital tests and procedure/radiological results at the follow up, please ask your Primary MD to get all Hospital records sent to his/her office.  In some cases, they will be blood work, cultures and biopsy results pending at the time of your discharge. Please request that your primary care M.D. goes through all the records of your hospital data and follows up on these results.  Also Note the following: If you experience worsening of your admission symptoms, develop shortness of breath, life threatening emergency, suicidal or homicidal thoughts you must seek medical attention immediately by calling 911 or calling your MD immediately  if symptoms less severe.  You must read complete instructions/literature along with all the possible adverse reactions/side effects for all the Medicines you take and that have been prescribed to you. Take any new Medicines after  you have completely understood and accpet all the possible adverse reactions/side effects.   Do not drive when taking Pain medications or sleeping medications (Benzodaizepines)  Do not take more than prescribed Pain, Sleep and Anxiety Medications. It is not advisable to combine anxiety,sleep and pain medications without talking with your primary care practitioner  Special Instructions: If you have smoked or chewed Tobacco  in the last 2 yrs please stop smoking, stop any regular Alcohol  and or any Recreational drug use.  Wear Seat belts while driving.  Please note: You were cared for by a hospitalist during your hospital stay. Once you are discharged, your primary care physician will handle any further medical  issues. Please note that NO REFILLS for any discharge medications will be authorized once you are discharged, as it is imperative that you return to your primary care physician (or establish a relationship with a primary care physician if you do not have one) for your post hospital discharge needs so that they can reassess your need for medications and monitor your lab values.  Total Time spent coordinating discharge including counseling, education and face to face time equals greater than 30 minutes.  SignedJeoffrey Massed 03/03/2023 10:48 AM

## 2023-03-03 NOTE — Progress Notes (Signed)
SLP Cancellation Note  Patient Details Name: David [name] MRN: [id number] DOB: Aug 23, 1950   Cancelled treatment:       Reason Eval/Treat Not Completed: SLP screened, no needs identified, will sign off. Patient reports that symptoms resolved last night. Thank you for this consult!   Angela Nevin, MA, CCC-SLP Speech Therapy

## 2023-03-04 ENCOUNTER — Encounter: Payer: Self-pay | Admitting: Neurology

## 2023-03-05 DIAGNOSIS — G4733 Obstructive sleep apnea (adult) (pediatric): Secondary | ICD-10-CM | POA: Diagnosis not present

## 2023-03-09 DIAGNOSIS — Z7951 Long term (current) use of inhaled steroids: Secondary | ICD-10-CM | POA: Diagnosis not present

## 2023-03-09 DIAGNOSIS — R1310 Dysphagia, unspecified: Secondary | ICD-10-CM | POA: Diagnosis not present

## 2023-03-09 DIAGNOSIS — Z905 Acquired absence of kidney: Secondary | ICD-10-CM | POA: Diagnosis not present

## 2023-03-09 DIAGNOSIS — Z79899 Other long term (current) drug therapy: Secondary | ICD-10-CM | POA: Diagnosis not present

## 2023-03-09 DIAGNOSIS — F419 Anxiety disorder, unspecified: Secondary | ICD-10-CM | POA: Diagnosis not present

## 2023-03-09 DIAGNOSIS — Z79890 Hormone replacement therapy (postmenopausal): Secondary | ICD-10-CM | POA: Diagnosis not present

## 2023-03-09 DIAGNOSIS — Z7982 Long term (current) use of aspirin: Secondary | ICD-10-CM | POA: Diagnosis not present

## 2023-03-09 DIAGNOSIS — K219 Gastro-esophageal reflux disease without esophagitis: Secondary | ICD-10-CM | POA: Diagnosis not present

## 2023-03-09 DIAGNOSIS — R1314 Dysphagia, pharyngoesophageal phase: Secondary | ICD-10-CM | POA: Diagnosis not present

## 2023-03-09 DIAGNOSIS — E039 Hypothyroidism, unspecified: Secondary | ICD-10-CM | POA: Diagnosis not present

## 2023-03-09 DIAGNOSIS — F32A Depression, unspecified: Secondary | ICD-10-CM | POA: Diagnosis not present

## 2023-03-09 DIAGNOSIS — Z7902 Long term (current) use of antithrombotics/antiplatelets: Secondary | ICD-10-CM | POA: Diagnosis not present

## 2023-03-16 DIAGNOSIS — E291 Testicular hypofunction: Secondary | ICD-10-CM | POA: Diagnosis not present

## 2023-03-16 DIAGNOSIS — R899 Unspecified abnormal finding in specimens from other organs, systems and tissues: Secondary | ICD-10-CM | POA: Diagnosis not present

## 2023-03-16 DIAGNOSIS — R1310 Dysphagia, unspecified: Secondary | ICD-10-CM | POA: Diagnosis not present

## 2023-03-16 DIAGNOSIS — R972 Elevated prostate specific antigen [PSA]: Secondary | ICD-10-CM | POA: Diagnosis not present

## 2023-03-16 DIAGNOSIS — F3341 Major depressive disorder, recurrent, in partial remission: Secondary | ICD-10-CM | POA: Diagnosis not present

## 2023-03-17 DIAGNOSIS — E782 Mixed hyperlipidemia: Secondary | ICD-10-CM | POA: Diagnosis not present

## 2023-03-17 DIAGNOSIS — I10 Essential (primary) hypertension: Secondary | ICD-10-CM | POA: Diagnosis not present

## 2023-03-17 DIAGNOSIS — R899 Unspecified abnormal finding in specimens from other organs, systems and tissues: Secondary | ICD-10-CM | POA: Diagnosis not present

## 2023-03-24 DIAGNOSIS — F3341 Major depressive disorder, recurrent, in partial remission: Secondary | ICD-10-CM | POA: Diagnosis not present

## 2023-03-24 DIAGNOSIS — Z79899 Other long term (current) drug therapy: Secondary | ICD-10-CM | POA: Diagnosis not present

## 2023-03-24 DIAGNOSIS — Z932 Ileostomy status: Secondary | ICD-10-CM | POA: Diagnosis not present

## 2023-03-24 DIAGNOSIS — G479 Sleep disorder, unspecified: Secondary | ICD-10-CM | POA: Diagnosis not present

## 2023-03-24 DIAGNOSIS — K219 Gastro-esophageal reflux disease without esophagitis: Secondary | ICD-10-CM | POA: Diagnosis not present

## 2023-03-24 DIAGNOSIS — Z9049 Acquired absence of other specified parts of digestive tract: Secondary | ICD-10-CM | POA: Diagnosis not present

## 2023-03-24 DIAGNOSIS — Z6829 Body mass index (BMI) 29.0-29.9, adult: Secondary | ICD-10-CM | POA: Diagnosis not present

## 2023-03-24 DIAGNOSIS — Z1159 Encounter for screening for other viral diseases: Secondary | ICD-10-CM | POA: Diagnosis not present

## 2023-03-24 DIAGNOSIS — Z0000 Encounter for general adult medical examination without abnormal findings: Secondary | ICD-10-CM | POA: Diagnosis not present

## 2023-03-24 DIAGNOSIS — E559 Vitamin D deficiency, unspecified: Secondary | ICD-10-CM | POA: Diagnosis not present

## 2023-03-24 DIAGNOSIS — E782 Mixed hyperlipidemia: Secondary | ICD-10-CM | POA: Diagnosis not present

## 2023-03-24 DIAGNOSIS — Z8719 Personal history of other diseases of the digestive system: Secondary | ICD-10-CM | POA: Diagnosis not present

## 2023-03-24 NOTE — Progress Notes (Unsigned)
NEUROLOGY CONSULTATION NOTE  David [name] MRN: [id number] DOB: [birth year]  Referring provider: Marvel Plan, MD (hospital referral) Primary care provider: Daisy Floro, MD  Reason for consult:  stroke  Assessment/Plan:   Right corona radiata lacunar infarct, likely secondary to small vessel disease Hypertension Hyperlipidemia OSA   Secondary stroke prevention as managed by PCP: ASA 81mg  daily Statin.  LDL goal less than 70 Normotensive blood pressure Hgb A1c goal less than 7 Mediterranean diet CPAP Routine exercise Follow up 6 months.   Subjective:  David [name] is a 72 year old right-handed male with HTN, OSA, CKD, and hypothyroidism and history of kidney cancer and ulcerative colitis s/p total colectomy who presents for stroke.  History supplemented by hospital records.  MRI and CTA head and neck personally reviewed.   On 11/26, he took Viagra in the morning.  Afterward, he developed a slight headache which is not unusual.  Later that day, he began experiencing tingling of his left hand and forearm.  Later, his left leg started to buckle.  He was admitted to [facility].  Did not receive tPA as he was outside window.  CT head showed no acute findings but MRI of brain revealed 4 mm acute infarct within the right caudate nucleus/corona radiata.  CTA head and neck revealed no LVO or hemodynamically significant stenosis.  2D echo showed EF 60-65% without cardiac source of embolus.  LDL 91 and Hgb A1c 5.5.  During hospitalization, he had recurrence of symptoms in setting of low blood pressure requiring IV bolus and IVF.Marland Kitchen  Repeat MRI showed stable 4 mm acute infarct without new findings.  Symptoms believed to be recrudescence of stroke related to hypotension.  Not on antithrombotics prior to admission, he was discharged on ASA 81mg  daily and Plavix 75mg  daily for 3 weeks followed by ASA alone.  Started on Crestor 10mg  daily.  Lisinopril was discontinued.  Feeling  well.  No recurrent symptoms.      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    OA (osteoarthritis) of knee    both   OSA on CPAP    Personal history of asthma    childhood   Right nephrolithiasis    non-obstuctive per ct 03-14-2015   Right ureteral stone    S/P ileostomy (HCC)    UC (ulcerative colitis) (HCC)     PAST SURGICAL HISTORY: Past Surgical History:  Procedure Laterality Date   APPENDECTOMY  1981   CYSTO/  RIGHT RETROGRADE PYELOGRAM/  RIGHT STENT PLACEMENT  09-23-2006   KNEE ARTHROSCOPY Left 01/01/2015   Procedure: ARTHROSCOPY KNEE;  Surgeon: Salvatore Marvel, MD;  Location: [facility] OR;  Service: Orthopedics;  Laterality: Left;   PARTIAL NEPHRECTOMY Left 2003   TONSILLECTOMY  1990's   TOTAL KNEE ARTHROPLASTY Left 01/01/2015   Procedure: TOTAL KNEE ARTHROPLASTY;  Surgeon: Salvatore Marvel, MD;  Location: [facility] OR;  Service: Orthopedics;  Laterality: Left;   TOTAL KNEE ARTHROPLASTY Right 02/12/2015   Procedure: TOTAL RIGHT KNEE ARTHROPLASTY;  Surgeon: Salvatore Marvel, MD;  Location: [facility] OR;  Service: Orthopedics;  Laterality: Right;   TOTAL PROCTOCOLECTOMY W/ END ILEOSTOMY  1981    MEDICATIONS: Current Outpatient Medications on File Prior to Visit  Medication Sig Dispense Refill   acetaminophen (TYLENOL) 500 MG tablet Take 500 mg by mouth daily as needed for moderate pain (pain score 4-6), fever or headache.     Ascorbic Acid (VITAMIN C PO) Take 1 tablet by mouth daily.     aspirin 81 MG chewable tablet Chew 1 tablet (81 mg total) by mouth daily. 30 tablet 2   b complex vitamins  capsule Take 1 capsule by mouth daily.     clopidogrel (PLAVIX) 75 MG tablet Take 1 tablet (75 mg total) by mouth daily for 21 days. 21 tablet 0   DULoxetine (CYMBALTA) 30 MG capsule Take 30 mg by mouth every evening.     Glucosamine-Chondroitin (GLUCOSAMINE CHONDR COMPLEX PO) Take 2 tablets by mouth daily.     levothyroxine (SYNTHROID) 112 MCG tablet Take 112 mcg by mouth daily before breakfast.     lisinopril (ZESTRIL) 10 MG tablet Take 1 tablet (10 mg total) by mouth daily.     Multiple Vitamins-Minerals (MULTIVITAMIN MEN 50+) TABS Take 1 tablet by mouth daily.     Omega-3 Fatty Acids (FISH OIL PO) Take 2 capsules by mouth daily.     rosuvastatin (CRESTOR) 10 MG tablet Take 1 tablet (10 mg total) by mouth daily. 30 tablet 2   TESTOSTERONE IM Inject 0.375 mLs into the muscle every Thursday.     No current facility-administered medications on file prior to visit.    ALLERGIES: No Known Allergies  FAMILY HISTORY: Family History  Problem Relation Age of Onset   Cancer Father        thyroid   Prostate cancer Brother     Objective:  Blood pressure (!) 145/83, pulse 80, height 5\' 8"  (1.727 m), weight 185 lb (83.9 kg), SpO2 98%. General: No acute distress.  Patient appears well-groomed.   Head:  Normocephalic/atraumatic Eyes:  fundi examined but not visualized Neck: supple, no paraspinal tenderness, full range of motion Heart: regular rate and rhythm Neurological Exam: Mental status: alert and oriented to person, place, and time, speech fluent and not dysarthric, language intact. Cranial nerves: CN I: not tested CN II: pupils equal, round and reactive to light, visual fields intact CN III, IV, VI:  full range of motion, no nystagmus, no ptosis CN V: facial sensation intact. CN VII: upper and lower face symmetric CN VIII: hearing intact CN IX, X: gag intact, uvula midline CN XI: sternocleidomastoid and trapezius muscles intact CN XII: tongue midline Bulk & Tone: normal, no  fasciculations. Motor:  muscle strength 5/5 throughout Sensation:  Pinprick sensation intact; vibratory sensation mildly reduced in feet. Deep Tendon Reflexes:  2+ throughout,  toes downgoing.   Finger to nose testing:  Without dysmetria.   Gait:  Normal station and stride.  Romberg negative.    Thank you for allowing me to take part in the care of this patient.  Shon Millet, DO  CC: Daisy Floro, MD

## 2023-03-25 ENCOUNTER — Ambulatory Visit: Payer: Medicare HMO | Admitting: Neurology

## 2023-03-25 ENCOUNTER — Encounter: Payer: Self-pay | Admitting: Neurology

## 2023-03-25 VITALS — BP 145/83 | HR 80 | Ht 68.0 in | Wt 185.0 lb

## 2023-03-25 DIAGNOSIS — I6381 Other cerebral infarction due to occlusion or stenosis of small artery: Secondary | ICD-10-CM | POA: Diagnosis not present

## 2023-03-25 DIAGNOSIS — E785 Hyperlipidemia, unspecified: Secondary | ICD-10-CM

## 2023-03-25 DIAGNOSIS — G4733 Obstructive sleep apnea (adult) (pediatric): Secondary | ICD-10-CM

## 2023-03-25 NOTE — Patient Instructions (Addendum)
Continue aspirin 81mg  daily Continue rosuvastatin  Blood pressure control Blood sugar control Mediterranean diet (see below) Routine exercise Continue using CPAP Follow up 6 months    Mediterranean Diet A Mediterranean diet is based on the traditions of countries on the [employer]. It focuses on eating more: Fruits and vegetables. Whole grains, beans, nuts, and seeds. Heart-healthy fats. These are fats that are good for your heart. It involves eating less: Dairy. Meat and eggs. Processed foods with added sugar, salt, and fat. This type of diet can help prevent certain conditions. It can also improve outcomes if you have a long-term (chronic) disease, such as kidney or heart disease. What are tips for following this plan? Reading food labels Check packaged foods for: The serving size. For foods such as rice and pasta, the serving size is the amount of cooked product, not dry. The total fat. Avoid foods with saturated fat or trans fat. Added sugars, such as corn syrup. Shopping  Try to have a balanced diet. Buy a variety of foods, such as: Fresh fruits and vegetables. You may be able to get these from local farmers markets. You can also buy them frozen. Grains, beans, nuts, and seeds. Some of these can be bought in bulk. Fresh seafood. Poultry and eggs. Low-fat dairy products. Buy whole ingredients instead of foods that have already been packaged. If you can't get fresh seafood, buy precooked frozen shrimp or canned fish, such as tuna, salmon, or sardines. Stock your pantry so you always have certain foods on hand, such as olive oil, canned tuna, canned tomatoes, rice, pasta, and beans. Cooking Cook foods with extra-virgin olive oil instead of using butter or other vegetable oils. Have meat as a side dish. Have vegetables or grains as your main dish. This means having meat in small portions or adding small amounts of meat to foods like pasta or stew. Use beans or  vegetables instead of meat in common dishes like chili or lasagna. Try out different cooking methods. Try roasting, broiling, steaming, and sauting vegetables. Add frozen vegetables to soups, stews, pasta, or rice. Add nuts or seeds for added healthy fats and plant protein at each meal. You can add these to yogurt, salads, or vegetable dishes. Marinate fish or vegetables using olive oil, lemon juice, garlic, and fresh herbs. Meal planning Plan to eat a vegetarian meal one day each week. Try to work up to two vegetarian meals, if possible. Eat seafood two or more times a week. Have healthy snacks on hand. These may include: Vegetable sticks with hummus. Greek yogurt. Fruit and nut trail mix. Eat balanced meals. These should include: Fruit: 2-3 servings a day. Vegetables: 4-5 servings a day. Low-fat dairy: 2 servings a day. Fish, poultry, or lean meat: 1 serving a day. Beans and legumes: 2 or more servings a week. Nuts and seeds: 1-2 servings a day. Whole grains: 6-8 servings a day. Extra-virgin olive oil: 3-4 servings a day. Limit red meat and sweets to just a few servings a month. Lifestyle  Try to cook and eat meals with your family. Drink enough fluid to keep your pee (urine) pale yellow. Be active every day. This includes: Aerobic exercise, which is exercise that causes your heart to beat faster. Examples include running and swimming. Leisure activities like gardening, walking, or housework. Get 7-8 hours of sleep each night. Drink red wine if your provider says you can. A glass of wine is 5 oz (150 mL). You may be allowed to have: Up  to 1 glass a day if you're male and not pregnant. Up to 2 glasses a day if you're male. What foods should I eat? Fruits Apples. Apricots. Avocado. Berries. Bananas. Cherries. Dates. Figs. Grapes. Lemons. Melon. Oranges. Peaches. Plums. Pomegranate. Vegetables Artichokes. Beets. Broccoli. Cabbage. Carrots. Eggplant. Green beans. Chard. Kale.  Spinach. Onions. Leeks. Peas. Squash. Tomatoes. Peppers. Radishes. Grains Whole-grain pasta. Brown rice. Bulgur wheat. Polenta. Couscous. Whole-wheat bread. Orpah Cobb. Meats and other proteins Beans. Almonds. Sunflower seeds. Pine nuts. Peanuts. Cod. Salmon. Scallops. Shrimp. Tuna. Tilapia. Clams. Oysters. Eggs. Chicken or [place] without skin. Dairy Low-fat milk. Cheese. Greek yogurt. Fats and oils Extra-virgin olive oil. Avocado oil. Grapeseed oil. Beverages Water. Red wine. Herbal tea. Sweets and desserts Greek yogurt with honey. Baked apples. Poached pears. Trail mix. Seasonings and condiments Basil. Cilantro. Coriander. Cumin. Mint. Parsley. Sage. Rosemary. Tarragon. Garlic. Oregano. Thyme. Pepper. Balsamic vinegar. Tahini. Hummus. Tomato sauce. Olives. Mushrooms. The items listed above may not be all the foods and drinks you can have. Talk to a dietitian to learn more. What foods should I limit? This is a list of foods that should be eaten rarely. Fruits Fruit canned in syrup. Vegetables Deep-fried potatoes, like [place] fries. Grains Packaged pasta or rice dishes. Cereal with added sugar. Snacks with added sugar. Meats and other proteins Beef. Pork. Lamb. Chicken or [place] with skin. Hot dogs. Tomasa Blase. Dairy Ice cream. Sour cream. Whole milk. Fats and oils Butter. Canola oil. Vegetable oil. Beef fat (tallow). Lard. Beverages Juice. Sugar-sweetened soft drinks. Beer. Liquor and spirits. Sweets and desserts Cookies. Cakes. Pies. Candy. Seasonings and condiments Mayonnaise. Pre-made sauces and marinades. The items listed above may not be all the foods and drinks you should limit. Talk to a dietitian to learn more. Where to find more information American Heart Association (AHA): heart.org This information is not intended to replace advice given to you by your health care provider. Make sure you discuss any questions you have with your health care provider. Document  Revised: 07/06/2022 Document Reviewed: 07/06/2022 Elsevier Patient Education  2024 [employer].

## 2023-04-14 DIAGNOSIS — M25552 Pain in left hip: Secondary | ICD-10-CM | POA: Diagnosis not present

## 2023-04-14 DIAGNOSIS — M545 Low back pain, unspecified: Secondary | ICD-10-CM | POA: Diagnosis not present

## 2023-04-17 DIAGNOSIS — G4733 Obstructive sleep apnea (adult) (pediatric): Secondary | ICD-10-CM | POA: Diagnosis not present

## 2023-04-21 DIAGNOSIS — G5702 Lesion of sciatic nerve, left lower limb: Secondary | ICD-10-CM | POA: Diagnosis not present

## 2023-04-22 DIAGNOSIS — K5190 Ulcerative colitis, unspecified, without complications: Secondary | ICD-10-CM | POA: Diagnosis not present

## 2023-04-22 DIAGNOSIS — Z932 Ileostomy status: Secondary | ICD-10-CM | POA: Diagnosis not present

## 2023-04-22 DIAGNOSIS — C61 Malignant neoplasm of prostate: Secondary | ICD-10-CM | POA: Diagnosis not present

## 2023-04-22 DIAGNOSIS — E039 Hypothyroidism, unspecified: Secondary | ICD-10-CM | POA: Diagnosis not present

## 2023-04-23 DIAGNOSIS — K5190 Ulcerative colitis, unspecified, without complications: Secondary | ICD-10-CM | POA: Diagnosis not present

## 2023-04-23 DIAGNOSIS — Z932 Ileostomy status: Secondary | ICD-10-CM | POA: Diagnosis not present

## 2023-04-23 DIAGNOSIS — E039 Hypothyroidism, unspecified: Secondary | ICD-10-CM | POA: Diagnosis not present

## 2023-04-23 DIAGNOSIS — C61 Malignant neoplasm of prostate: Secondary | ICD-10-CM | POA: Diagnosis not present

## 2023-05-04 NOTE — Progress Notes (Signed)
Patient stated had esophageal manometry at [facility]. So we are cancelling the manometry for [place].

## 2023-05-05 DIAGNOSIS — R1310 Dysphagia, unspecified: Secondary | ICD-10-CM | POA: Diagnosis not present

## 2023-05-13 ENCOUNTER — Ambulatory Visit (HOSPITAL_COMMUNITY): Admit: 2023-05-13 | Payer: Medicare HMO | Admitting: Gastroenterology

## 2023-05-13 ENCOUNTER — Encounter (HOSPITAL_COMMUNITY): Payer: Self-pay

## 2023-05-13 DIAGNOSIS — L814 Other melanin hyperpigmentation: Secondary | ICD-10-CM | POA: Diagnosis not present

## 2023-05-13 DIAGNOSIS — L578 Other skin changes due to chronic exposure to nonionizing radiation: Secondary | ICD-10-CM | POA: Diagnosis not present

## 2023-05-13 DIAGNOSIS — L821 Other seborrheic keratosis: Secondary | ICD-10-CM | POA: Diagnosis not present

## 2023-05-13 DIAGNOSIS — D171 Benign lipomatous neoplasm of skin and subcutaneous tissue of trunk: Secondary | ICD-10-CM | POA: Diagnosis not present

## 2023-05-13 DIAGNOSIS — B360 Pityriasis versicolor: Secondary | ICD-10-CM | POA: Diagnosis not present

## 2023-05-13 SURGERY — MANOMETRY, ESOPHAGUS
Laterality: Bilateral

## 2023-06-03 DIAGNOSIS — E039 Hypothyroidism, unspecified: Secondary | ICD-10-CM | POA: Diagnosis not present

## 2023-06-03 DIAGNOSIS — Z79899 Other long term (current) drug therapy: Secondary | ICD-10-CM | POA: Diagnosis not present

## 2023-06-03 DIAGNOSIS — E291 Testicular hypofunction: Secondary | ICD-10-CM | POA: Diagnosis not present

## 2023-06-03 DIAGNOSIS — R972 Elevated prostate specific antigen [PSA]: Secondary | ICD-10-CM | POA: Diagnosis not present

## 2023-06-03 DIAGNOSIS — E559 Vitamin D deficiency, unspecified: Secondary | ICD-10-CM | POA: Diagnosis not present

## 2023-06-08 ENCOUNTER — Other Ambulatory Visit: Payer: Self-pay

## 2023-06-08 DIAGNOSIS — I10 Essential (primary) hypertension: Secondary | ICD-10-CM | POA: Insufficient documentation

## 2023-06-08 DIAGNOSIS — N201 Calculus of ureter: Secondary | ICD-10-CM | POA: Insufficient documentation

## 2023-06-08 DIAGNOSIS — N200 Calculus of kidney: Secondary | ICD-10-CM | POA: Insufficient documentation

## 2023-06-08 DIAGNOSIS — M179 Osteoarthritis of knee, unspecified: Secondary | ICD-10-CM | POA: Insufficient documentation

## 2023-06-08 DIAGNOSIS — Z8719 Personal history of other diseases of the digestive system: Secondary | ICD-10-CM | POA: Insufficient documentation

## 2023-06-08 DIAGNOSIS — Z85528 Personal history of other malignant neoplasm of kidney: Secondary | ICD-10-CM | POA: Insufficient documentation

## 2023-06-08 DIAGNOSIS — E039 Hypothyroidism, unspecified: Secondary | ICD-10-CM | POA: Insufficient documentation

## 2023-06-08 DIAGNOSIS — E0500 Thyrotoxicosis with diffuse goiter without thyrotoxic crisis or storm: Secondary | ICD-10-CM | POA: Insufficient documentation

## 2023-06-08 DIAGNOSIS — Z932 Ileostomy status: Secondary | ICD-10-CM | POA: Insufficient documentation

## 2023-06-08 DIAGNOSIS — R931 Abnormal findings on diagnostic imaging of heart and coronary circulation: Secondary | ICD-10-CM | POA: Insufficient documentation

## 2023-06-08 DIAGNOSIS — Z87442 Personal history of urinary calculi: Secondary | ICD-10-CM | POA: Insufficient documentation

## 2023-06-08 DIAGNOSIS — K5190 Ulcerative colitis, unspecified, without complications: Secondary | ICD-10-CM | POA: Insufficient documentation

## 2023-06-08 DIAGNOSIS — Z8051 Family history of malignant neoplasm of kidney: Secondary | ICD-10-CM | POA: Insufficient documentation

## 2023-06-08 DIAGNOSIS — E782 Mixed hyperlipidemia: Secondary | ICD-10-CM | POA: Insufficient documentation

## 2023-06-08 DIAGNOSIS — Z8709 Personal history of other diseases of the respiratory system: Secondary | ICD-10-CM | POA: Insufficient documentation

## 2023-06-08 DIAGNOSIS — Z789 Other specified health status: Secondary | ICD-10-CM | POA: Insufficient documentation

## 2023-06-08 DIAGNOSIS — K219 Gastro-esophageal reflux disease without esophagitis: Secondary | ICD-10-CM | POA: Insufficient documentation

## 2023-06-08 DIAGNOSIS — F419 Anxiety disorder, unspecified: Secondary | ICD-10-CM | POA: Insufficient documentation

## 2023-06-09 DIAGNOSIS — Z85528 Personal history of other malignant neoplasm of kidney: Secondary | ICD-10-CM | POA: Diagnosis not present

## 2023-06-09 DIAGNOSIS — E890 Postprocedural hypothyroidism: Secondary | ICD-10-CM | POA: Diagnosis not present

## 2023-06-09 DIAGNOSIS — Z932 Ileostomy status: Secondary | ICD-10-CM | POA: Diagnosis not present

## 2023-06-09 DIAGNOSIS — R972 Elevated prostate specific antigen [PSA]: Secondary | ICD-10-CM | POA: Diagnosis not present

## 2023-06-09 DIAGNOSIS — E782 Mixed hyperlipidemia: Secondary | ICD-10-CM | POA: Diagnosis not present

## 2023-06-09 DIAGNOSIS — E559 Vitamin D deficiency, unspecified: Secondary | ICD-10-CM | POA: Diagnosis not present

## 2023-06-09 DIAGNOSIS — Z9049 Acquired absence of other specified parts of digestive tract: Secondary | ICD-10-CM | POA: Diagnosis not present

## 2023-06-09 DIAGNOSIS — M8580 Other specified disorders of bone density and structure, unspecified site: Secondary | ICD-10-CM | POA: Diagnosis not present

## 2023-06-09 DIAGNOSIS — E291 Testicular hypofunction: Secondary | ICD-10-CM | POA: Diagnosis not present

## 2023-06-09 DIAGNOSIS — G4733 Obstructive sleep apnea (adult) (pediatric): Secondary | ICD-10-CM | POA: Diagnosis not present

## 2023-06-10 ENCOUNTER — Ambulatory Visit: Payer: Medicare HMO

## 2023-06-10 ENCOUNTER — Ambulatory Visit

## 2023-06-10 VITALS — BP 132/90 | HR 69 | Ht 68.0 in | Wt 179.1 lb

## 2023-06-10 DIAGNOSIS — R42 Dizziness and giddiness: Secondary | ICD-10-CM | POA: Insufficient documentation

## 2023-06-10 DIAGNOSIS — R931 Abnormal findings on diagnostic imaging of heart and coronary circulation: Secondary | ICD-10-CM

## 2023-06-10 DIAGNOSIS — E782 Mixed hyperlipidemia: Secondary | ICD-10-CM | POA: Diagnosis not present

## 2023-06-10 DIAGNOSIS — I10 Essential (primary) hypertension: Secondary | ICD-10-CM | POA: Diagnosis not present

## 2023-06-10 DIAGNOSIS — Z789 Other specified health status: Secondary | ICD-10-CM

## 2023-06-10 HISTORY — DX: Dizziness and giddiness: R42

## 2023-06-10 MED ORDER — PRAVASTATIN SODIUM 20 MG PO TABS: ORAL_TABLET | ORAL | 3 refills | Status: DC

## 2023-06-10 NOTE — Assessment & Plan Note (Signed)
 He has indication to be on statins given his history of stroke and coronary atherosclerosis. His last lipid panel in the acute setting at the time of stroke March 03, 2023 total cholesterol 152, triglycerides 165, HDL 28, LDL 91.  He was unable to tolerate Crestor. Willing to try alternate statins. Discussed pravastatin 20 mg once daily to be tried 2 times a week initially for the first month and escalated further if tolerated well. If unable to tolerate will discuss alternate lipid-lowering therapies with Zetia +/- PCSK9 inhibitors.  If told doing pravastatin well and able to escalate the dose to daily regimen, will have him come back for follow-up in 3 months with blood work CMP, lipid panel.

## 2023-06-10 NOTE — Assessment & Plan Note (Signed)
 Reported lightheadedness in the setting of low blood pressure.  Mentions symptoms can last for multiple hours in the day.  He is unable to sense if there were any skipped beats or fast heartbeats. He also had recent stroke. Has not had any heart monitoring for any atrial arrhythmias.  Will proceed with 28-day heart monitor to assess for any arrhythmias.

## 2023-06-10 NOTE — Assessment & Plan Note (Signed)
 Well-controlled currently. Mentions tends to have low blood pressures.  He has been adjusting his lisinopril dose based on the blood pressure readings at home currently has been taking lisinopril 5 mg once daily. Continue the same. Target blood pressure below 120 over 80 mmHg. Mentions at home this morning systolic was in low 100s.

## 2023-06-10 NOTE — Assessment & Plan Note (Signed)
 Coronary artery process with calcium score of 86.4 which puts him at 43rd percentile for his age. Continue with aspirin 81 mg once daily that it has been started for his recent stroke. Continue with lipid-lowering therapy as discussed above.

## 2023-06-10 NOTE — Progress Notes (Signed)
 Cardiology Consultation:    Date:  06/10/2023   ID:  David [name], Howes 1950-07-10, MRN [id number]  PCP:  Daisy Floro, MD  Cardiologist:  Marlyn Corporal Nadja Lina, MD   Referring MD: Daisy Floro, MD   No chief complaint on file.    ASSESSMENT AND PLAN:   Mr Quevedo 73 year old male with history of coronary atherosclerosis calcium score 86.4, recent CVA right carotid nucleus infarct secondary to small vessel disease November 2024, hyperlipidemia, hypertension, ulcerative colitis s/p total colectomy with ostomy in place, hypothyroidism, CKD stage II/III, obstructive sleep apnea-uses CPAP, incomplete RBBB on EKG, low testosterone levels was briefly on testosterone supplementation, here for further evaluation and cardiology to discuss lipid-lowering therapy and cardiovascular risk reduction. Problem List Items Addressed This Visit     Elevated coronary artery calcium score - Primary   Coronary artery process with calcium score of 86.4 which puts him at 43rd percentile for his age. Continue with aspirin 81 mg once daily that it has been started for his recent stroke. Continue with lipid-lowering therapy as discussed above.       Relevant Medications   lisinopril (ZESTRIL) 10 MG tablet   pravastatin (PRAVACHOL) 20 MG tablet   Other Relevant Orders   EKG 12-Lead (Completed)   Hypertension   Well-controlled currently. Mentions tends to have low blood pressures.  He has been adjusting his lisinopril dose based on the blood pressure readings at home currently has been taking lisinopril 5 mg once daily. Continue the same. Target blood pressure below 120 over 80 mmHg. Mentions at home this morning systolic was in low 100s.      Relevant Medications   lisinopril (ZESTRIL) 10 MG tablet   pravastatin (PRAVACHOL) 20 MG tablet   Mixed hyperlipidemia   He has indication to be on statins given his history of stroke and coronary atherosclerosis. His last lipid panel in the acute  setting at the time of stroke March 03, 2023 total cholesterol 152, triglycerides 165, HDL 28, LDL 91.  He was unable to tolerate Crestor. Willing to try alternate statins. Discussed pravastatin 20 mg once daily to be tried 2 times a week initially for the first month and escalated further if tolerated well. If unable to tolerate will discuss alternate lipid-lowering therapies with Zetia +/- PCSK9 inhibitors.  If told doing pravastatin well and able to escalate the dose to daily regimen, will have him come back for follow-up in 3 months with blood work CMP, lipid panel.      Relevant Medications   lisinopril (ZESTRIL) 10 MG tablet   pravastatin (PRAVACHOL) 20 MG tablet   Other Relevant Orders   EKG 12-Lead (Completed)   Comprehensive metabolic panel   Lipid panel   Statin intolerance   Relevant Orders   EKG 12-Lead (Completed)   Comprehensive metabolic panel   Lipid panel   Lightheadedness   Reported lightheadedness in the setting of low blood pressure.  Mentions symptoms can last for multiple hours in the day.  He is unable to sense if there were any skipped beats or fast heartbeats. He also had recent stroke. Has not had any heart monitoring for any atrial arrhythmias.  Will proceed with 28-day heart monitor to assess for any arrhythmias.       Relevant Orders   LONG TERM MONITOR (3-14 DAYS)    Return to clinic for follow-up in 3 months.  History of Present Illness:    David [name] TOZZI is a 73 y.o. male who is  being seen today for the evaluation of lipid management, cardiovascular risk reduction, at the request of Daisy Floro, MD.   History of recent CVA right caudate nucleus infarct secondary to small vessel disease November 2024, hyperlipidemia, hypertension, ulcerative colitis s/p total colectomy with ostomy in place, hypothyroidism, CKD stage 2/3, obstructive sleep apnea-uses CPAP, incomplete RBBB, Coronary calcium score study from January 2024 showed 86.4,  hepatic steatosis, low testosterone for which she was briefly on testosterone supplementation  He was admitted 03/01/2023 at [facility] for acute onset left side tingling and numbness, the onset correlated with dose of Viagra earlier in the morning and symptoms started later in the day and diagnosed with acute CVA, MRI of the brain confirmed 4 mm acute right caudate nucleus infarct felt secondary to small vessel disease.  CTA of the head and neck showed no significant stenosis.  Was outside the time window for thrombolytic therapy.  Transthoracic echocardiogram with normal biventricular function LVEF 60 to 65%, no atrial level shunt.  He was last on dual antiplatelet therapy for 3 weeks and subsequently on aspirin.  Since discharge has been doing well.  Has no further recurrence of symptoms. Activity is increased gradually.  Plays pickle ball and exercises regularly. Denies any chest pain, shortness of breath, orthopnea, paroxysmal nocturnal dyspnea.  Has chronic sense of fatigue and tiredness which has been going on for many years and attributes it to low testosterone. Denies any blood in urine or stools.  With statins he experienced symptoms of muscle aches in the past.  He retried taking statins after his stroke in November but with recurrence of symptoms he had to discontinue.  The only statin he has tried both times was Crestor.  He is willing to try alternate statins.  Does not smoke. Does not drink alcohol. No illicit drug use.  EKG in the clinic today shows sinus rhythm heart rate 69/min, PR interval 170 ms, QRS duration 102 ms incomplete RBBB, normal QTc.   Thoracic echocardiogram 03/02/2023 showed normal biventricular function LVEF 60 to 35%, normal RV function, no significant valve abnormalities., no atrial level shunt noted  Last lipid panel to review is from 03/03/2023 total cholesterol 152, triglycerides 165, HDL 28, LDL 91 BUN 13, creatinine 4.09, EGFR greater than  60 Normal thyroid panel with TSH 0.786 Hemoglobin A1c was 5.5 on 03/01/2023 Transaminases and alkaline phosphatase was normal on 03-01-2023.   Past Medical History:  Diagnosis Date   Acute renal failure (HCC) 10/28/2012   Anxiety    Cancer of left kidney (HCC) 12/20/2014   Dehydration 10/29/2012   Diarrhea 10/28/2012   DJD (degenerative joint disease) of knee 01/01/2015   Elevated coronary artery calcium score    FH: kidney cancer    Gastroenteritis, acute 10/29/2012   GERD (gastroesophageal reflux disease)    Graves disease    History of kidney stones    History of renal cell carcinoma    s/p  left partial nephrectomy   History of small bowel obstruction    recurrent   History of ulcerative colitis    s/p total colectomy 1981   Hypercalcemia 10/28/2012   Hypertension    Hypothyroidism    Ileostomy present (HCC) 10/28/2012   Leukocytosis 10/28/2012   IMO SNOMED Dx Update Oct 2024     Mixed hyperlipidemia    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    OA (osteoarthritis) of knee    both   Obstructive sleep apnea 01/01/2015   Paresthesia 04/14/2019   Personal  history of asthma    childhood   Primary localized osteoarthritis of left knee 12/20/2014   Primary localized osteoarthritis of right knee    Right nephrolithiasis    non-obstuctive per ct 03-14-2015   Right ureteral stone    S/P ileostomy (HCC)    Statin intolerance    Stroke-like symptoms 03/01/2023   UC (ulcerative colitis) [facility])     Past Surgical History:  Procedure Laterality Date   APPENDECTOMY  1981   CYSTO/  RIGHT RETROGRADE PYELOGRAM/  RIGHT STENT PLACEMENT  09-23-2006   KNEE ARTHROSCOPY Left 01/01/2015   Procedure: ARTHROSCOPY KNEE;  Surgeon: Salvatore Marvel, MD;  Location: [facility] OR;  Service: Orthopedics;  Laterality: Left;   PARTIAL NEPHRECTOMY Left 2003   TONSILLECTOMY  1990's   TOTAL KNEE ARTHROPLASTY Left 01/01/2015   Procedure: TOTAL KNEE ARTHROPLASTY;  Surgeon: Salvatore Marvel, MD;   Location: [facility] OR;  Service: Orthopedics;  Laterality: Left;   TOTAL KNEE ARTHROPLASTY Right 02/12/2015   Procedure: TOTAL RIGHT KNEE ARTHROPLASTY;  Surgeon: Salvatore Marvel, MD;  Location: [facility] OR;  Service: Orthopedics;  Laterality: Right;   TOTAL PROCTOCOLECTOMY W/ END ILEOSTOMY  1981    Current Medications: Current Meds  Medication Sig   acetaminophen (TYLENOL) 500 MG tablet Take 500 mg by mouth daily as needed for moderate pain (pain score 4-6), fever or headache.   albuterol (VENTOLIN HFA) 108 (90 Base) MCG/ACT inhaler Inhale 1-2 puffs into the lungs every 4 (four) hours as needed for wheezing or shortness of breath.   ALPRAZolam (XANAX) 0.25 MG tablet Take 0.25 mg by mouth 2 (two) times daily as needed for anxiety.   Ascorbic Acid (VITAMIN C PO) Take 1 tablet by mouth daily.   aspirin 81 MG chewable tablet Chew 1 tablet (81 mg total) by mouth daily.   b complex vitamins capsule Take 1 capsule by mouth daily.   Cholecalciferol 50 MCG (2000 UT) TABS Take 2,000 Units by mouth daily.   DULoxetine (CYMBALTA) 20 MG capsule Take 20 mg by mouth daily.   Glucosamine-Chondroitin (GLUCOSAMINE CHONDR COMPLEX PO) Take 2 tablets by mouth daily.   levothyroxine (SYNTHROID) 100 MCG tablet Take 100 mcg by mouth daily.   Multiple Vitamins-Minerals (MULTIVITAMIN MEN 50+) TABS Take 1 tablet by mouth daily.   Omega-3 Fatty Acids (FISH OIL PO) Take 2 capsules by mouth daily.   omeprazole (PRILOSEC) 20 MG capsule Take 1 capsule by mouth daily.   pravastatin (PRAVACHOL) 20 MG tablet Take 20 mg daily twice a week for 1 month then increase to 20 mg three times a week.   sildenafil (REVATIO) 20 MG tablet Take 2-5 tablets by mouth 2 (two) times daily as needed (ED).   tizanidine (ZANAFLEX) 2 MG capsule Take 2-4 mg by mouth 3 (three) times daily as needed for muscle spasms.   traZODone (DESYREL) 150 MG tablet Take 75 mg by mouth at bedtime.   [DISCONTINUED] clopidogrel (PLAVIX) 75 MG tablet Take 1 tablet by mouth  daily.   [DISCONTINUED] levothyroxine (SYNTHROID) 112 MCG tablet Take 112 mcg by mouth daily before breakfast.     Allergies:   Patient has no known allergies.   Social History   Socioeconomic History   Marital status: Married    Spouse name: Deon   Number of children: Not on file   Years of education: Not on file   Highest education level: Master's degree (e.g., MA, MS, MEng, MEd, MSW, MBA)  Occupational History   Not on file  Tobacco Use   Smoking  status: Never   Smokeless tobacco: Never  Vaping Use   Vaping status: Never Used  Substance and Sexual Activity   Alcohol use: No   Drug use: No   Sexual activity: Not on file  Other Topics Concern   Not on file  Social History Narrative   Lives with spouse   No caffeine   Right handed   Social Drivers of Health   Financial Resource Strain: Not on file  Food Insecurity: No Food Insecurity (03/02/2023)   Hunger Vital Sign    Worried About Running Out of Food in the Last Year: Never true    Ran Out of Food in the Last Year: Never true  Transportation Needs: No Transportation Needs (03/02/2023)   PRAPARE - Administrator, Civil Service (Medical): No    Lack of Transportation (Non-Medical): No  Physical Activity: Not on file  Stress: Not on file  Social Connections: Not on file     Family History: The patient's family history includes Cancer in his father; Prostate cancer in his brother. ROS:   Please see the history of present illness.    All 14 point review of systems negative except as described per history of present illness.  EKGs/Labs/Other Studies Reviewed:    The following studies were reviewed today:   EKG:  EKG Interpretation Date/Time:  Wednesday June 10 2023 13:42:57 EST Ventricular Rate:  69 PR Interval:  170 QRS Duration:  102 QT Interval:  398 QTC Calculation: 426 R Axis:   -33  Text Interpretation: Normal sinus rhythm Left axis deviation Incomplete right bundle branch block  Anterior infarct , age undetermined When compared with ECG of 01-Mar-2023 18:24, PREVIOUS ECG IS PRESENT Confirmed by Huntley Dec reddy [phone]) on 06/10/2023 2:23:00 PM    Recent Labs: 03/01/2023: ALT 30 03/02/2023: TSH 0.786 03/03/2023: BUN 13; Creatinine, Ser 1.27; Hemoglobin 14.7; Platelets 253; Potassium 4.2; Sodium 136  Recent Lipid Panel    Component Value Date/Time   CHOL 152 03/03/2023 0330   TRIG 165 (H) 03/03/2023 0330   HDL 28 (L) 03/03/2023 0330   CHOLHDL 5.4 03/03/2023 0330   VLDL 33 03/03/2023 0330   LDLCALC 91 03/03/2023 0330    Physical Exam:    VS:  BP (!) 132/90   Pulse 69   Ht 5\' 8"  (1.727 m)   Wt 179 lb 1.3 oz (81.2 kg)   SpO2 97%   BMI 27.23 kg/m     Wt Readings from Last 3 Encounters:  06/10/23 179 lb 1.3 oz (81.2 kg)  03/25/23 185 lb (83.9 kg)  05/17/20 179 lb (81.2 kg)     GENERAL:  Well nourished, well developed in no acute distress NECK: No JVD; No carotid bruits CARDIAC: RRR, S1 and S2 present, no murmurs, no rubs, no gallops CHEST:  Clear to auscultation without rales, wheezing or rhonchi  Extremities: No pitting pedal edema. Pulses bilaterally symmetric with radial 2+ and dorsalis pedis 2+ NEUROLOGIC:  Alert and oriented x 3  Medication Adjustments/Labs and Tests Ordered: Current medicines are reviewed at length with the patient today.  Concerns regarding medicines are outlined above.  Orders Placed This Encounter  Procedures   Comprehensive metabolic panel   Lipid panel   LONG TERM MONITOR (3-14 DAYS)   EKG 12-Lead   Meds ordered this encounter  Medications   pravastatin (PRAVACHOL) 20 MG tablet    Sig: Take 20 mg daily twice a week for 1 month then increase to 20 mg three times a  week.    Dispense:  90 tablet    Refill:  3    Signed, Brently Voorhis reddy Ziv Welchel, MD, MPH, [facility] 06/10/2023 2:38 PM    [facility]

## 2023-06-10 NOTE — Patient Instructions (Addendum)
 Medication Instructions:  Your physician has recommended you make the following change in your medication:   Start Pravastatin 20 mg twice weekly for 1 month and then increase to three times weekly.  *If you need a refill on your cardiac medications before your next appointment, please call your pharmacy*   Lab Work: Your physician recommends that you return for lab work in: 10 weeks (08/15/23) for CMP and lipids. You need to have labs done when you are fasting. [facility] lab is located on the 3rd floor, [address]. Hours are Monday - Friday 8 am to 4 pm, closed 11:30 am to 1:00 pm. You do NOT need an appointment.   If you have labs (blood work) drawn today and your tests are completely normal, you will receive your results only by: MyChart Message (if you have MyChart) OR A paper copy in the mail If you have any lab test that is abnormal or we need to change your treatment, we will call you to review the results.   Testing/Procedures: A zio monitor was ordered today. It will remain on for 14 days. Remove 06/24/23 and apply 2nd monitor for an additional 14 days. You will then return monitor and event diary in provided box. It takes 1-2 weeks for report to be downloaded and returned to [place]. We will call you with the results. If monitor falls off or has orange flashing light, please call Zio for further instructions.    Follow-Up: At [facility], you and your health needs are our priority.  As part of our continuing mission to provide you with exceptional heart care, we have created designated Provider Care Teams.  These Care Teams include your primary Cardiologist (physician) and Advanced Practice Providers (APPs -  Physician Assistants and Nurse Practitioners) who all work together to provide you with the care you need, when you need it.  We recommend signing up for the patient portal called "MyChart".  Sign up information is provided on this After Visit Summary.  MyChart is used to  connect with patients for Virtual Visits (Telemedicine).  Patients are able to view lab/test results, encounter notes, upcoming appointments, etc.  Non-urgent messages can be sent to your provider as well.   To learn more about what you can do with MyChart, go to [email or web address].    Your next appointment:   3 month(s)  The format for your next appointment:   In Person  Provider:   Huntley Dec, MD    Other Instructions none  Important Information About Sugar

## 2023-06-17 DIAGNOSIS — Z6828 Body mass index (BMI) 28.0-28.9, adult: Secondary | ICD-10-CM | POA: Diagnosis not present

## 2023-06-17 DIAGNOSIS — E890 Postprocedural hypothyroidism: Secondary | ICD-10-CM | POA: Diagnosis not present

## 2023-06-17 DIAGNOSIS — E291 Testicular hypofunction: Secondary | ICD-10-CM | POA: Diagnosis not present

## 2023-06-17 DIAGNOSIS — Z8673 Personal history of transient ischemic attack (TIA), and cerebral infarction without residual deficits: Secondary | ICD-10-CM | POA: Diagnosis not present

## 2023-06-17 DIAGNOSIS — Z79899 Other long term (current) drug therapy: Secondary | ICD-10-CM | POA: Diagnosis not present

## 2023-06-17 DIAGNOSIS — E280 Estrogen excess: Secondary | ICD-10-CM | POA: Diagnosis not present

## 2023-06-25 DIAGNOSIS — E291 Testicular hypofunction: Secondary | ICD-10-CM | POA: Diagnosis not present

## 2023-06-25 DIAGNOSIS — Z85528 Personal history of other malignant neoplasm of kidney: Secondary | ICD-10-CM | POA: Diagnosis not present

## 2023-06-25 DIAGNOSIS — Z8042 Family history of malignant neoplasm of prostate: Secondary | ICD-10-CM | POA: Diagnosis not present

## 2023-06-25 DIAGNOSIS — R972 Elevated prostate specific antigen [PSA]: Secondary | ICD-10-CM | POA: Diagnosis not present

## 2023-07-16 DIAGNOSIS — R42 Dizziness and giddiness: Secondary | ICD-10-CM | POA: Diagnosis not present

## 2023-07-31 DIAGNOSIS — R42 Dizziness and giddiness: Secondary | ICD-10-CM | POA: Diagnosis not present

## 2023-08-11 DIAGNOSIS — E782 Mixed hyperlipidemia: Secondary | ICD-10-CM | POA: Diagnosis not present

## 2023-08-11 DIAGNOSIS — E349 Endocrine disorder, unspecified: Secondary | ICD-10-CM | POA: Diagnosis not present

## 2023-08-11 DIAGNOSIS — Z79899 Other long term (current) drug therapy: Secondary | ICD-10-CM | POA: Diagnosis not present

## 2023-08-11 DIAGNOSIS — E291 Testicular hypofunction: Secondary | ICD-10-CM | POA: Diagnosis not present

## 2023-08-11 DIAGNOSIS — R5381 Other malaise: Secondary | ICD-10-CM | POA: Diagnosis not present

## 2023-08-11 DIAGNOSIS — Z6828 Body mass index (BMI) 28.0-28.9, adult: Secondary | ICD-10-CM | POA: Diagnosis not present

## 2023-08-11 DIAGNOSIS — E280 Estrogen excess: Secondary | ICD-10-CM | POA: Diagnosis not present

## 2023-08-11 DIAGNOSIS — E8770 Fluid overload, unspecified: Secondary | ICD-10-CM | POA: Diagnosis not present

## 2023-08-11 DIAGNOSIS — E890 Postprocedural hypothyroidism: Secondary | ICD-10-CM | POA: Diagnosis not present

## 2023-08-11 DIAGNOSIS — Z789 Other specified health status: Secondary | ICD-10-CM | POA: Diagnosis not present

## 2023-08-12 LAB — LIPID PANEL
Chol/HDL Ratio: 3.7 ratio (ref 0.0–5.0)
Cholesterol, Total: 151 mg/dL (ref 100–199)
HDL: 41 mg/dL (ref >39)
LDL Chol Calc (NIH): 81 mg/dL (ref 0–99)
Triglycerides: 171 mg/dL — ABNORMAL HIGH (ref 0–149)
VLDL Cholesterol Cal: 29 mg/dL (ref 5–40)

## 2023-08-12 LAB — COMPREHENSIVE METABOLIC PANEL WITH GFR
ALT: 21 IU/L (ref 0–44)
AST: 20 IU/L (ref 0–40)
Albumin: 4.5 g/dL (ref 3.8–4.8)
Alkaline Phosphatase: 62 IU/L (ref 44–121)
BUN/Creatinine Ratio: 12 (ref 10–24)
BUN: 15 mg/dL (ref 8–27)
Bilirubin Total: 0.6 mg/dL (ref 0.0–1.2)
CO2: 19 mmol/L — ABNORMAL LOW (ref 20–29)
Calcium: 9.8 mg/dL (ref 8.6–10.2)
Chloride: 105 mmol/L (ref 96–106)
Creatinine, Ser: 1.28 mg/dL — ABNORMAL HIGH (ref 0.76–1.27)
Globulin, Total: 2.7 g/dL (ref 1.5–4.5)
Glucose: 82 mg/dL (ref 70–99)
Potassium: 4.8 mmol/L (ref 3.5–5.2)
Sodium: 139 mmol/L (ref 134–144)
Total Protein: 7.2 g/dL (ref 6.0–8.5)
eGFR: 60 mL/min/{1.73_m2} (ref >59)

## 2023-08-17 DIAGNOSIS — G4733 Obstructive sleep apnea (adult) (pediatric): Secondary | ICD-10-CM | POA: Diagnosis not present

## 2023-09-09 ENCOUNTER — Other Ambulatory Visit: Payer: Self-pay

## 2023-09-10 ENCOUNTER — Ambulatory Visit

## 2023-09-10 VITALS — BP 110/68 | HR 60 | Ht 68.0 in | Wt 177.0 lb

## 2023-09-10 DIAGNOSIS — E782 Mixed hyperlipidemia: Secondary | ICD-10-CM | POA: Diagnosis not present

## 2023-09-10 DIAGNOSIS — R931 Abnormal findings on diagnostic imaging of heart and coronary circulation: Secondary | ICD-10-CM

## 2023-09-10 NOTE — Assessment & Plan Note (Signed)
 Lipid panel from 08/11/2023 noted total cholesterol 151, triglycerides 171, HDL 41, LDL 81, somewhat similar to prior labs from normal 2024 with LDL 91 and HDL 28. CMP from 08/11/2023 noted BUN 15, creatinine 1.28, EGFR 60 Sodium 139, potassium 4.8, normal transaminases and alkaline phosphatase  Currently taking pravastatin  alternating dose of 5 mg and 10 mg daily and titrating up as tolerated.  Recommended to continue titrating up as tolerated and will follow-up fasting lipid panel in 6 months. If not at goal, will add Zetia and further consider PCSK9 inhibitors. He is agreeable with this plan.

## 2023-09-10 NOTE — Progress Notes (Signed)
 Cardiology Consultation:    Date:  09/10/2023   ID:  David [name], Sanmiguel 1950/05/02, MRN [id number]  PCP:  Jimmey Mould, MD  Cardiologist:  Daymon Evans Salle Brandle, MD   Referring MD: Jimmey Mould, MD   No chief complaint on file.    ASSESSMENT AND PLAN:   Mr. David [name] 73 year old male with history of coronary atherosclerosis with calcium  score 86 January 2024, recent TIA/CVA with right carotid nucleus infarct secondary to small vessel disease November 2024, hyperlipidemia, hypertension, hepatic steatosis noted on prior cardiac calcium  score, ulcerative colitis s/p total colectomy with ostomy in place [since his age 20s], hypothyroidism, CKD stage II/III, obstructive sleep apnea-uses CPAP, incomplete RBBB, low testosterone  levels and was briefly on testosterone  supplementation.  Noted normal biventricular function on echocardiogram November 2024 with LVEF 60 to 65% and no significant valve abnormalities, Here for follow-up with regards to hyperlipidemia management.  Problem List Items Addressed This Visit     Elevated coronary artery calcium  score   Good functional status.  AC Malik from cardiac standpoint.  Exercise regularly. Prior cardiac calcium  score study from January 2024 was 86.  Next  Continue with aspirin  81 mg once daily given history of prior TIA. Continue with statin therapy for lipid-lowering as discussed below.        Relevant Medications   furosemide (LASIX) 20 MG tablet   Mixed hyperlipidemia - Primary   Lipid panel from 08/11/2023 noted total cholesterol 151, triglycerides 171, HDL 41, LDL 81, somewhat similar to prior labs from normal 2024 with LDL 91 and HDL 28. CMP from 08/11/2023 noted BUN 15, creatinine 1.28, EGFR 60 Sodium 139, potassium 4.8, normal transaminases and alkaline phosphatase  Currently taking pravastatin  alternating dose of 5 mg and 10 mg daily and titrating up as tolerated.  Recommended to continue titrating up as tolerated and will  follow-up fasting lipid panel in 6 months. If not at goal, will add Zetia and further consider PCSK9 inhibitors. He is agreeable with this plan.      Relevant Medications   furosemide (LASIX) 20 MG tablet    Return to clinic tentatively in 6 months  History of Present Illness:    David [name] is a 73 y.o. male who is being seen today for follow-up visit. PCP is Jimmey Mould, MD. Last visit with me in the office was 06-10-2023  History of coronary atherosclerosis with calcium  score 86 January 2024, recent CVA with right carotid nucleus infarct secondary to small vessel disease November 2024, hyperlipidemia, hypertension, hepatic steatosis noted on prior cardiac calcium  score, ulcerative colitis s/p total colectomy with ostomy in place [since his age 20s], hypothyroidism, CKD stage II/III, obstructive sleep apnea-uses CPAP, incomplete RBBB, low testosterone  levels and was briefly on testosterone  supplementation.  Noted normal biventricular function on echocardiogram November 2024 with LVEF 60 to 65% and no significant valve abnormalities Seen initially for consult in March 5 for lipid-lowering therapy and cardiovascular risk reduction.  Did not tolerate Crestor  in the past was willing to try alternate statins and prescribed pravastatin  20 mg to be started initially 2 times a week and subsequently escalate further as tolerated.  Workup with heart monitor for 30 days 06-10-2023 noted predominantly sinus rhythm with average heart rate 66/min, no evidence of atrial fibrillation.  Rare ventricular and supraventricular ectopy burden less than 1% and 8 short runs of SVT with the longest episode lasting 8 beats and 1 short run of NSVT lasting 5 beats.  Triggered rhythm for symptoms  correlated with no significant abnormality.  Lipid panel from 08/11/2023 noted total cholesterol 151, triglycerides 171, HDL 41, LDL 81, somewhat similar to prior labs from normal 2024 with LDL 91 and HDL 28. CMP from  08/11/2023 noted BUN 15, creatinine 1.28, EGFR 60 Sodium 139, potassium 4.8, normal transaminases and alkaline phosphatase  Here today mentions overall he has been doing well physically able to do his functions and activities and regular exercise without any limitation.  Taking pravastatin  every day alternating the dose of 5 mg and 10 mg which he seems to be tolerating well and he plans to escalate this further over the next week to half tablet every day.  Has been taking Clomid and anastrozole as prescribed by PCP and urologist for his low testosterone  levels.  Mentions he takes lisinopril  half tablet a day and adjust the dose further depending on blood pressures at home.  Past Medical History:  Diagnosis Date   Acute renal failure (HCC) 10/28/2012   Anxiety    Cancer of left kidney (HCC) 12/20/2014   Dehydration 10/29/2012   Diarrhea 10/28/2012   DJD (degenerative joint disease) of knee 01/01/2015   Elevated coronary artery calcium  score    FH: kidney cancer    Gastroenteritis, acute 10/29/2012   GERD (gastroesophageal reflux disease)    Graves disease    History of kidney stones    History of renal cell carcinoma    s/p  left partial nephrectomy   History of small bowel obstruction    recurrent   History of ulcerative colitis    s/p total colectomy 1981   Hypercalcemia 10/28/2012   Hypertension    Hypothyroidism    Ileostomy present (HCC) 10/28/2012   Leukocytosis 10/28/2012   IMO SNOMED Dx Update Oct 2024     Lightheadedness 06/10/2023   Mixed hyperlipidemia    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    OA (osteoarthritis) of knee    both   Obstructive sleep apnea 01/01/2015   Paresthesia 04/14/2019   Personal history of asthma    childhood   Primary localized osteoarthritis of left knee 12/20/2014   Primary localized osteoarthritis of right knee    Right nephrolithiasis    non-obstuctive per ct 03-14-2015   Right ureteral stone    S/P ileostomy (HCC)     Statin intolerance    Stroke-like symptoms 03/01/2023   UC (ulcerative colitis) [facility])     Past Surgical History:  Procedure Laterality Date   APPENDECTOMY  1981   CYSTO/  RIGHT RETROGRADE PYELOGRAM/  RIGHT STENT PLACEMENT  09-23-2006   KNEE ARTHROSCOPY Left 01/01/2015   Procedure: ARTHROSCOPY KNEE;  Surgeon: Elly Habermann, MD;  [facility] OR;  Service: Orthopedics;  Laterality: Left;   PARTIAL NEPHRECTOMY Left 2003   TONSILLECTOMY  1990's   TOTAL KNEE ARTHROPLASTY Left 01/01/2015   Procedure: TOTAL KNEE ARTHROPLASTY;  Surgeon: Elly Habermann, MD;  Location: [facility] OR;  Service: Orthopedics;  Laterality: Left;   TOTAL KNEE ARTHROPLASTY Right 02/12/2015   Procedure: TOTAL RIGHT KNEE ARTHROPLASTY;  Surgeon: Elly Habermann, MD;  Location: [facility] OR;  Service: Orthopedics;  Laterality: Right;   TOTAL PROCTOCOLECTOMY W/ END ILEOSTOMY  1981    Current Medications: Current Meds  Medication Sig   acetaminophen  (TYLENOL ) 500 MG tablet Take 500 mg by mouth daily as needed for moderate pain (pain score 4-6), fever or headache.   albuterol (VENTOLIN HFA) 108 (90 Base) MCG/ACT inhaler Inhale 1-2 puffs into the lungs every 4 (four) hours as needed for wheezing  or shortness of breath.   ALPRAZolam  (XANAX ) 0.25 MG tablet Take 0.25 mg by mouth 2 (two) times daily as needed for anxiety.   anastrozole (ARIMIDEX) 1 MG tablet Take 1 mg by mouth daily.   Ascorbic Acid (VITAMIN C PO) Take 1 tablet by mouth daily.   aspirin  81 MG chewable tablet Chew 1 tablet (81 mg total) by mouth daily.   b complex vitamins capsule Take 1 capsule by mouth daily.   Cholecalciferol 50 MCG (2000 UT) TABS Take 2,000 Units by mouth daily.   CLOMID 50 MG tablet Take 25 mg by mouth daily.   DULoxetine  (CYMBALTA ) 20 MG capsule Take 20 mg by mouth daily.   furosemide (LASIX) 20 MG tablet Take 20 mg by mouth daily as needed for fluid or edema.   Glucosamine-Chondroitin (GLUCOSAMINE CHONDR COMPLEX PO) Take 2 tablets by mouth daily.    levothyroxine  (SYNTHROID ) 100 MCG tablet Take 100 mcg by mouth daily.   lisinopril  (ZESTRIL ) 10 MG tablet Take 5 mg by mouth daily.   Multiple Vitamins-Minerals (MULTIVITAMIN MEN 50+) TABS Take 1 tablet by mouth daily.   Omega-3 Fatty Acids (FISH OIL PO) Take 2 capsules by mouth daily.   omeprazole  (PRILOSEC ) 20 MG capsule Take 1 capsule by mouth daily.   pravastatin  (PRAVACHOL ) 20 MG tablet Take 20 mg daily twice a week for 1 month then increase to 20 mg three times a week.   tizanidine (ZANAFLEX) 2 MG capsule Take 2-4 mg by mouth 3 (three) times daily as needed for muscle spasms.   traZODone  (DESYREL ) 150 MG tablet Take 75 mg by mouth at bedtime.     Allergies:   Patient has no known allergies.   Social History   Socioeconomic History   Marital status: Married    Spouse name: Deon   Number of children: Not on file   Years of education: Not on file   Highest education level: Master's degree (e.g., MA, MS, MEng, MEd, MSW, MBA)  Occupational History   Not on file  Tobacco Use   Smoking status: Never   Smokeless tobacco: Never  Vaping Use   Vaping status: Never Used  Substance and Sexual Activity   Alcohol use: No   Drug use: No   Sexual activity: Not on file  Other Topics Concern   Not on file  Social History Narrative   Lives with spouse   No caffeine   Right handed   Social Drivers of Corporate investment banker Strain: Not on file  Food Insecurity: Low Risk  (06/25/2023)   Received from [facility]   Hunger Vital Sign    Worried About Running Out of Food in the Last Year: Never true    Ran Out of Food in the Last Year: Never true  Transportation Needs: No Transportation Needs (06/25/2023)   Received from [facility]    In the past 12 months, has lack of reliable transportation kept you from medical appointments, meetings, work or from getting things needed for daily living? : No  Physical Activity: Not on file  Stress: Not on file  Social  Connections: Not on file     Family History: The patient's family history includes Cancer in his father; Prostate cancer in his brother. ROS:   Please see the history of present illness.    All 14 point review of systems negative except as described per history of present illness.  EKGs/Labs/Other Studies Reviewed:    The following studies were  reviewed today:   EKG:       Recent Labs: 03/02/2023: TSH 0.786 03/03/2023: Hemoglobin 14.7; Platelets 253 08/11/2023: ALT 21; BUN 15; Creatinine, Ser 1.28; Potassium 4.8; Sodium 139  Recent Lipid Panel    Component Value Date/Time   CHOL 151 08/11/2023 0817   TRIG 171 (H) 08/11/2023 0817   HDL 41 08/11/2023 0817   CHOLHDL 3.7 08/11/2023 0817   CHOLHDL 5.4 03/03/2023 0330   VLDL 33 03/03/2023 0330   LDLCALC 81 08/11/2023 0817    Physical Exam:    VS:  BP 110/68   Pulse 60   Ht 5\' 8"  (1.727 m)   Wt 177 lb (80.3 kg)   SpO2 96%   BMI 26.91 kg/m     Wt Readings from Last 3 Encounters:  09/10/23 177 lb (80.3 kg)  06/10/23 179 lb 1.3 oz (81.2 kg)  03/25/23 185 lb (83.9 kg)     GENERAL:  Well nourished, well developed in no acute distress CARDIAC: RRR, S1 and S2 present, no murmurs, no rubs, no gallops CHEST:  Clear to auscultation without rales, wheezing or rhonchi  Extremities: No pitting pedal edema. Pulses bilaterally symmetric with radial 2+ and dorsalis pedis 2+ NEUROLOGIC:  Alert and oriented x 3  Medication Adjustments/Labs and Tests Ordered: Current medicines are reviewed at length with the patient today.  Concerns regarding medicines are outlined above.  No orders of the defined types were placed in this encounter.  No orders of the defined types were placed in this encounter.   Signed, Lura Sallies, MD, MPH, [facility]. 09/10/2023 9:06 AM    [facility]

## 2023-09-10 NOTE — Patient Instructions (Signed)
 Medication Instructions:  Your physician recommends that you continue on your current medications as directed. Please refer to the Current Medication list given to you today.  *If you need a refill on your cardiac medications before your next appointment, please call your pharmacy*  Lab Work: Your physician recommends that you return for lab work in:   Labs in 6 months: Lipid  If you have labs (blood work) drawn today and your tests are completely normal, you will receive your results only by: MyChart Message (if you have MyChart) OR A paper copy in the mail If you have any lab test that is abnormal or we need to change your treatment, we will call you to review the results.  Testing/Procedures: None  Follow-Up: At [facility], you and your health needs are our priority.  As part of our continuing mission to provide you with exceptional heart care, our providers are all part of one team.  This team includes your primary Cardiologist (physician) and Advanced Practice Providers or APPs (Physician Assistants and Nurse Practitioners) who all work together to provide you with the care you need, when you need it.  Your next appointment:   6 month(s)  Provider:   Bertha Broad, MD    We recommend signing up for the patient portal called "MyChart".  Sign up information is provided on this After Visit Summary.  MyChart is used to connect with patients for Virtual Visits (Telemedicine).  Patients are able to view lab/test results, encounter notes, upcoming appointments, etc.  Non-urgent messages can be sent to your provider as well.   To learn more about what you can do with MyChart, go to [email or web address].   Other Instructions None

## 2023-09-10 NOTE — Assessment & Plan Note (Signed)
 Good functional status.  AC Malik from cardiac standpoint.  Exercise regularly. Prior cardiac calcium  score study from January 2024 was 86.  Next  Continue with aspirin  81 mg once daily given history of prior TIA. Continue with statin therapy for lipid-lowering as discussed below.

## 2023-09-15 DIAGNOSIS — G4733 Obstructive sleep apnea (adult) (pediatric): Secondary | ICD-10-CM | POA: Diagnosis not present

## 2023-09-23 ENCOUNTER — Ambulatory Visit: Payer: Medicare HMO | Admitting: Neurology

## 2023-10-19 DIAGNOSIS — G4733 Obstructive sleep apnea (adult) (pediatric): Secondary | ICD-10-CM | POA: Diagnosis not present

## 2023-10-19 DIAGNOSIS — E782 Mixed hyperlipidemia: Secondary | ICD-10-CM | POA: Diagnosis not present

## 2024-01-15 DIAGNOSIS — R972 Elevated prostate specific antigen [PSA]: Secondary | ICD-10-CM | POA: Diagnosis not present

## 2024-01-19 DIAGNOSIS — E291 Testicular hypofunction: Secondary | ICD-10-CM | POA: Diagnosis not present

## 2024-01-19 DIAGNOSIS — Z8042 Family history of malignant neoplasm of prostate: Secondary | ICD-10-CM | POA: Diagnosis not present

## 2024-01-19 DIAGNOSIS — R972 Elevated prostate specific antigen [PSA]: Secondary | ICD-10-CM | POA: Diagnosis not present

## 2024-01-22 DIAGNOSIS — Z8042 Family history of malignant neoplasm of prostate: Secondary | ICD-10-CM | POA: Diagnosis not present

## 2024-01-22 DIAGNOSIS — E291 Testicular hypofunction: Secondary | ICD-10-CM | POA: Diagnosis not present

## 2024-02-15 DIAGNOSIS — Z23 Encounter for immunization: Secondary | ICD-10-CM | POA: Diagnosis not present

## 2024-02-18 DIAGNOSIS — G5702 Lesion of sciatic nerve, left lower limb: Secondary | ICD-10-CM | POA: Insufficient documentation

## 2024-02-18 DIAGNOSIS — M25552 Pain in left hip: Secondary | ICD-10-CM | POA: Diagnosis not present

## 2024-03-02 DIAGNOSIS — R931 Abnormal findings on diagnostic imaging of heart and coronary circulation: Secondary | ICD-10-CM | POA: Diagnosis not present

## 2024-03-02 DIAGNOSIS — E782 Mixed hyperlipidemia: Secondary | ICD-10-CM | POA: Diagnosis not present

## 2024-03-03 LAB — LIPID PANEL
Chol/HDL Ratio: 2.8 ratio (ref 0.0–5.0)
Cholesterol, Total: 155 mg/dL (ref 100–199)
HDL: 56 mg/dL (ref >39)
LDL Chol Calc (NIH): 82 mg/dL (ref 0–99)
Triglycerides: 93 mg/dL (ref 0–149)
VLDL Cholesterol Cal: 17 mg/dL (ref 5–40)

## 2024-03-07 ENCOUNTER — Other Ambulatory Visit: Payer: Self-pay

## 2024-03-09 ENCOUNTER — Ambulatory Visit

## 2024-03-09 VITALS — BP 100/70 | HR 61 | Ht 67.0 in | Wt 180.1 lb

## 2024-03-09 DIAGNOSIS — E782 Mixed hyperlipidemia: Secondary | ICD-10-CM

## 2024-03-09 DIAGNOSIS — I10 Essential (primary) hypertension: Secondary | ICD-10-CM | POA: Diagnosis not present

## 2024-03-09 DIAGNOSIS — R931 Abnormal findings on diagnostic imaging of heart and coronary circulation: Secondary | ICD-10-CM | POA: Diagnosis not present

## 2024-03-09 DIAGNOSIS — E291 Testicular hypofunction: Secondary | ICD-10-CM | POA: Insufficient documentation

## 2024-03-09 DIAGNOSIS — E890 Postprocedural hypothyroidism: Secondary | ICD-10-CM | POA: Insufficient documentation

## 2024-03-09 MED ORDER — PRAVASTATIN SODIUM 40 MG PO TABS: 40.0000 mg | ORAL_TABLET | Freq: Every evening | ORAL | 3 refills | Status: AC

## 2024-03-09 NOTE — Assessment & Plan Note (Signed)
 Well-controlled. In fact on the lower end. He is adjusting his lisinopril  dose based on blood pressure readings at home and recently has been on lisinopril  5 mg once daily and plans to cut it down to alternating 5 mg and 2.5 mg dose over the next few days.  Target blood pressure below 120/80 mmHg so long as he is asymptomatic.

## 2024-03-09 NOTE — Assessment & Plan Note (Signed)
 Lipid panel from 03/02/2024 total cholesterol 155, triglycerides 93, HDL 56 and LDL 82 shows an improvement in terms of triglycerides and HDL.  LDL unchanged.  Titrate up pravastatin  to 40 mg once daily. Repeat lipid panel tentatively in 3 months. If LDL not optimal, target less than 70 mg/dL, will add Zetia.  He is agreeable.

## 2024-03-09 NOTE — Assessment & Plan Note (Signed)
 Excellent functional status. CCS class I. Calcium  score 86 from January 2024. Continue aspirin  81 mg once daily Continue statin therapy, increase pravastatin  to 40 mg once daily as discussed below.

## 2024-03-09 NOTE — Patient Instructions (Signed)
 Medication Instructions:  Your physician has recommended you make the following change in your medication:   START: Aspirin  81 mg daily START: Pravastatin  40 mg daily  *If you need a refill on your cardiac medications before your next appointment, please call your pharmacy*  Lab Work: Your physician recommends that you return for lab work in:   Labs in 3 month: Lipids  If you have labs (blood work) drawn today and your tests are completely normal, you will receive your results only by: MyChart Message (if you have MyChart) OR A paper copy in the mail If you have any lab test that is abnormal or we need to change your treatment, we will call you to review the results.  Testing/Procedures: None  Follow-Up: At [facility], you and your health needs are our priority.  As part of our continuing mission to provide you with exceptional heart care, our providers are all part of one team.  This team includes your primary Cardiologist (physician) and Advanced Practice Providers or APPs (Physician Assistants and Nurse Practitioners) who all work together to provide you with the care you need, when you need it.  Your next appointment:   6 month(s)  Provider:   Alean Kobus, MD    We recommend signing up for the patient portal called MyChart.  Sign up information is provided on this After Visit Summary.  MyChart is used to connect with patients for Virtual Visits (Telemedicine).  Patients are able to view lab/test results, encounter notes, upcoming appointments, etc.  Non-urgent messages can be sent to your provider as well.   To learn more about what you can do with MyChart, go to [email or web address].   Other Instructions None

## 2024-03-09 NOTE — Progress Notes (Signed)
 Cardiology Consultation:    Date:  03/09/2024   ID:  David [name], David [name] 02-04-1951, MRN [id number]  PCP:  Okey Carlin Redbird, MD  Cardiologist:  Alean SAUNDERS Irving Lubbers, MD   Referring MD: Okey Carlin Redbird, MD   No chief complaint on file.    ASSESSMENT AND PLAN:   David [name] 73 year old male history of  coronary atherosclerosis with calcium  score 86 January 2024, TIA/CVA with right carotid nucleus infarct secondary to small vessel disease November 2024, hyperlipidemia, hypertension, hepatic steatosis noted on prior cardiac calcium  score, ulcerative colitis s/p total colectomy with ostomy in place [since his age 20s], hypothyroidism, CKD stage II/III, obstructive sleep apnea-uses CPAP, incomplete RBBB, low testosterone  levels and was briefly on testosterone  supplementation.  Noted normal biventricular function on echocardiogram November 2024 with LVEF 60 to 65% and no significant valve abnormalities.  Heart monitor 30-day study March 2025 showed 8 short runs of SVT longest lasting 8 beats and 1 short run of NSVT lasting 5 beats in associated with symptoms.  Here for routine follow-up visit. Problem List Items Addressed This Visit     Elevated coronary artery calcium  score   Excellent functional status. CCS class I. Calcium  score 86 from January 2024. Continue aspirin  81 mg once daily Continue statin therapy, increase pravastatin  to 40 mg once daily as discussed below.      Relevant Medications   aspirin  EC 81 MG tablet   pravastatin  (PRAVACHOL ) 40 MG tablet   Hypertension   Well-controlled. In fact on the lower end. He is adjusting his lisinopril  dose based on blood pressure readings at home and recently has been on lisinopril  5 mg once daily and plans to cut it down to alternating 5 mg and 2.5 mg dose over the next few days.  Target blood pressure below 120/80 mmHg so long as he is asymptomatic.      Relevant Medications   aspirin  EC 81 MG tablet   pravastatin   (PRAVACHOL ) 40 MG tablet   Mixed hyperlipidemia - Primary   Lipid panel from 03/02/2024 total cholesterol 155, triglycerides 93, HDL 56 and LDL 82 shows an improvement in terms of triglycerides and HDL.  LDL unchanged.  Titrate up pravastatin  to 40 mg once daily. Repeat lipid panel tentatively in 3 months. If LDL not optimal, target less than 70 mg/dL, will add Zetia.  He is agreeable.      Relevant Medications   aspirin  EC 81 MG tablet   pravastatin  (PRAVACHOL ) 40 MG tablet   Other Relevant Orders   Lipid panel   Return to clinic tentatively in 6 months.   History of Present Illness:    David [name] is a 73 y.o. male who is being seen today for follow-up visit. PCP is Okey Carlin Redbird, MD. Last visit with me in the office was 09/10/2023.  Pleasant man here for the visit by himself.  Has history of  coronary atherosclerosis with calcium  score 86 January 2024, TIA/CVA with right carotid nucleus infarct secondary to small vessel disease November 2024, hyperlipidemia, hypertension, hepatic steatosis noted on prior cardiac calcium  score, ulcerative colitis s/p total colectomy with ostomy in place [since his age 20s], hypothyroidism, CKD stage II/III, obstructive sleep apnea-uses CPAP, incomplete RBBB, low testosterone  levels and was briefly on testosterone  supplementation.  Noted normal biventricular function on echocardiogram November 2024 with LVEF 60 to 65% and no significant valve abnormalities  Overall doing well.  From cardiac standpoint no significant limitations.  Has been able to exercise regularly stretching  exercises, exercise bike and plays pickle ball up to 3 times a week.  No symptoms of chest pain or shortness of breath.  Occasionally when he feels bloated, fluid retention associated with any bowel movement changes, he uses furosemide 20 mg on an as-needed basis.  Last used few weeks ago after his travel down to [place] for his business.  Has been taking his  pravastatin  consistently and dose currently 20 mg once daily.  In addition has been taking folic acid, omega-3 fatty acids and co-Q10 which he feels has helped him tolerate the medication better.  Mentions blood pressures have been relatively on the lower side and he has cut back on the lisinopril  to 5 mg once daily.  No symptoms of lightheadedness or dizziness.  Lipid panel 03/02/2024 shows total cholesterol 155, triglycerides 93, HDL 56, LDL 82. CMP from 01/22/2024 shows sodium 138, potassium 4.5 BUN 18, creatinine 1.25, eGFR 62 Normal transaminases and alkaline phosphatase CBC with hemoglobin 14.4, hematocrit 41.9, WBC 7.5 and platelets 235.  Past Medical History:  Diagnosis Date   Acute renal failure 10/28/2012   Anxiety    Cancer of left kidney (HCC) 12/20/2014   Dehydration 10/29/2012   Diarrhea 10/28/2012   DJD (degenerative joint disease) of knee 01/01/2015   Elevated coronary artery calcium  score    FH: kidney cancer    Gastroenteritis, acute 10/29/2012   GERD (gastroesophageal reflux disease)    Graves disease    History of kidney stones    History of renal cell carcinoma    s/p  left partial nephrectomy   History of small bowel obstruction    recurrent   History of ulcerative colitis    s/p total colectomy 1981   Hypercalcemia 10/28/2012   Hypertension    Hypothyroidism    Ileostomy present (HCC) 10/28/2012   Leukocytosis 10/28/2012   IMO SNOMED Dx Update Oct 2024     Lightheadedness 06/10/2023   Mixed hyperlipidemia    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    OA (osteoarthritis) of knee    both   Obstructive sleep apnea 01/01/2015   Paresthesia 04/14/2019   Personal history of asthma    childhood   Primary localized osteoarthritis of left knee 12/20/2014   Primary localized osteoarthritis of right knee    Right nephrolithiasis    non-obstuctive per ct 03-14-2015   Right ureteral stone    S/P ileostomy (HCC)    Statin intolerance    Stroke  (HCC) 03/01/23   very mild stroke   Stroke-like symptoms 03/01/2023   UC (ulcerative colitis) [facility])     Past Surgical History:  Procedure Laterality Date   APPENDECTOMY  1981   CYSTO/  RIGHT RETROGRADE PYELOGRAM/  RIGHT STENT PLACEMENT  09-23-2006   KNEE ARTHROSCOPY Left 01/01/2015   Procedure: ARTHROSCOPY KNEE;  Surgeon: Lamar Millman, MD;  Location: [facility] OR;  Service: Orthopedics;  Laterality: Left;   PARTIAL NEPHRECTOMY Left 2003   TONSILLECTOMY  1990's   TOTAL KNEE ARTHROPLASTY Left 01/01/2015   Procedure: TOTAL KNEE ARTHROPLASTY;  Surgeon: Lamar Millman, MD;  Location: [facility] OR;  Service: Orthopedics;  Laterality: Left;   TOTAL KNEE ARTHROPLASTY Right 02/12/2015   Procedure: TOTAL RIGHT KNEE ARTHROPLASTY;  Surgeon: Lamar Millman, MD;  Location: [facility] OR;  Service: Orthopedics;  Laterality: Right;   TOTAL PROCTOCOLECTOMY W/ END ILEOSTOMY  1981    Current Medications: Current Meds  Medication Sig   acetaminophen  (TYLENOL ) 500 MG tablet Take 500 mg by mouth daily as needed for moderate pain (  pain score 4-6), fever or headache.   albuterol (VENTOLIN HFA) 108 (90 Base) MCG/ACT inhaler Inhale 1-2 puffs into the lungs every 4 (four) hours as needed for wheezing or shortness of breath.   ALPRAZolam  (XANAX ) 0.25 MG tablet Take 0.25 mg by mouth 2 (two) times daily as needed for anxiety.   anastrozole (ARIMIDEX) 1 MG tablet Take 1 mg by mouth daily.   Ascorbic Acid (VITAMIN C PO) Take 1 tablet by mouth daily.   aspirin  EC 81 MG tablet Take 81 mg by mouth daily. Swallow whole.   b complex vitamins capsule Take 1 capsule by mouth daily.   BENFOTIAMINE PO Take 1 tablet by mouth daily.   Cholecalciferol 50 MCG (2000 UT) TABS Take 2,000 Units by mouth daily.   CLOMID 50 MG tablet Take 25 mg by mouth daily.   Coenzyme Q10 (CO Q-10 PO) Take 1 tablet by mouth daily.   DULoxetine  (CYMBALTA ) 20 MG capsule Take 40 mg by mouth daily.   furosemide (LASIX) 20 MG tablet Take 20 mg by mouth daily as needed for  fluid or edema.   Glucosamine-Chondroitin (GLUCOSAMINE CHONDR COMPLEX PO) Take 2 tablets by mouth daily.   levothyroxine  (SYNTHROID ) 100 MCG tablet Take 100 mcg by mouth daily.   lisinopril  (ZESTRIL ) 10 MG tablet Take 5 mg by mouth daily.   Multiple Vitamins-Minerals (MULTIVITAMIN MEN 50+) TABS Take 1 tablet by mouth daily.   Omega-3 Fatty Acids (FISH OIL PO) Take 2 capsules by mouth daily.   omeprazole  (PRILOSEC ) 20 MG capsule Take 1 capsule by mouth daily.   pravastatin  (PRAVACHOL ) 40 MG tablet Take 1 tablet (40 mg total) by mouth every evening.   tizanidine (ZANAFLEX) 2 MG capsule Take 2-4 mg by mouth 3 (three) times daily as needed for muscle spasms.   traZODone  (DESYREL ) 150 MG tablet Take 75 mg by mouth at bedtime.   [DISCONTINUED] pravastatin  (PRAVACHOL ) 20 MG tablet Take 20 mg daily twice a week for 1 month then increase to 20 mg three times a week.     Allergies:   Patient has no known allergies.   Social History   Socioeconomic History   Marital status: Married    Spouse name: Deon   Number of children: Not on file   Years of education: Not on file   Highest education level: Master's degree (e.g., MA, MS, MEng, MEd, MSW, MBA)  Occupational History   Not on file  Tobacco Use   Smoking status: Never   Smokeless tobacco: Never  Vaping Use   Vaping status: Never Used  Substance and Sexual Activity   Alcohol use: No   Drug use: No   Sexual activity: Not on file  Other Topics Concern   Not on file  Social History Narrative   Lives with spouse   No caffeine   Right handed   Social Drivers of Corporate Investment Banker Strain: Not on file  Food Insecurity: Low Risk  (02/15/2024)   Received from [facility]   Hunger Vital Sign    Within the past 12 months, you worried that your food would run out before you got money to buy more: Never true    Within the past 12 months, the food you bought just didn't last and you didn't have money to get more. : Never true   Transportation Needs: No Transportation Needs (02/15/2024)   Received from [facility]    In the past 12 months, has lack of reliable transportation  kept you from medical appointments, meetings, work or from getting things needed for daily living? : No  Physical Activity: Not on file  Stress: Not on file  Social Connections: Not on file     Family History: The patient's family history includes Cancer in his father; Prostate cancer in his brother. ROS:   Please see the history of present illness.    All 14 point review of systems negative except as described per history of present illness.  EKGs/Labs/Other Studies Reviewed:    The following studies were reviewed today:   EKG:       Recent Labs: 08/11/2023: ALT 21; BUN 15; Creatinine, Ser 1.28; Potassium 4.8; Sodium 139  Recent Lipid Panel    Component Value Date/Time   CHOL 155 03/02/2024 0802   TRIG 93 03/02/2024 0802   HDL 56 03/02/2024 0802   CHOLHDL 2.8 03/02/2024 0802   CHOLHDL 5.4 03/03/2023 0330   VLDL 33 03/03/2023 0330   LDLCALC 82 03/02/2024 0802    Physical Exam:    VS:  BP 100/70   Pulse 61   Ht 5' 7 (1.702 m)   Wt 180 lb 1.9 oz (81.7 kg)   SpO2 99%   BMI 28.21 kg/m     Wt Readings from Last 3 Encounters:  03/09/24 180 lb 1.9 oz (81.7 kg)  09/10/23 177 lb (80.3 kg)  06/10/23 179 lb 1.3 oz (81.2 kg)     GENERAL:  Well nourished, well developed in no acute distress NECK: No JVD CARDIAC: RRR, S1 and S2 present, no murmurs, no rubs, no gallops CHEST:  Clear to auscultation without rales, wheezing or rhonchi  Extremities: No pitting pedal edema. Pulses bilaterally symmetric with radial 2+ and dorsalis pedis 2+ NEUROLOGIC:  Alert and oriented x 3  Medication Adjustments/Labs and Tests Ordered: Current medicines are reviewed at length with the patient today.  Concerns regarding medicines are outlined above.  Orders Placed This Encounter  Procedures   Lipid panel   Meds ordered  this encounter  Medications   pravastatin  (PRAVACHOL ) 40 MG tablet    Sig: Take 1 tablet (40 mg total) by mouth every evening.    Dispense:  90 tablet    Refill:  3    Signed, Mckenna Gamm reddy Kaegan Hettich, MD, MPH, [facility]. 03/09/2024 2:27 PM    [facility]

## 2024-03-17 DIAGNOSIS — E890 Postprocedural hypothyroidism: Secondary | ICD-10-CM | POA: Diagnosis not present

## 2024-03-17 DIAGNOSIS — E559 Vitamin D deficiency, unspecified: Secondary | ICD-10-CM | POA: Diagnosis not present

## 2024-03-17 DIAGNOSIS — Z1159 Encounter for screening for other viral diseases: Secondary | ICD-10-CM | POA: Diagnosis not present

## 2024-03-17 DIAGNOSIS — Z79899 Other long term (current) drug therapy: Secondary | ICD-10-CM | POA: Diagnosis not present

## 2024-03-22 ENCOUNTER — Inpatient Hospital Stay (HOSPITAL_COMMUNITY): Admission: RE | Admit: 2024-03-22 | Discharge: 2024-03-22 | Attending: *Deleted | Admitting: *Deleted

## 2024-03-22 DIAGNOSIS — L24B3 Irritant contact dermatitis related to fecal or urinary stoma or fistula: Secondary | ICD-10-CM | POA: Insufficient documentation

## 2024-03-22 DIAGNOSIS — K9410 Enterostomy complication, unspecified: Secondary | ICD-10-CM | POA: Diagnosis not present

## 2024-03-22 DIAGNOSIS — Z432 Encounter for attention to ileostomy: Secondary | ICD-10-CM

## 2024-03-22 DIAGNOSIS — K5190 Ulcerative colitis, unspecified, without complications: Secondary | ICD-10-CM | POA: Insufficient documentation

## 2024-03-22 NOTE — Progress Notes (Signed)
 [facility]   Reason for visit:  RLQ ileostomy over 20  HPI:  Ulcerative colitis, total colectomy and end ileostomy Past Medical History:  Diagnosis Date   Acute renal failure 10/28/2012   Anxiety    Cancer of left kidney (HCC) 12/20/2014   Dehydration 10/29/2012   Diarrhea 10/28/2012   DJD (degenerative joint disease) of knee 01/01/2015   Elevated coronary artery calcium  score    FH: kidney cancer    Gastroenteritis, acute 10/29/2012   GERD (gastroesophageal reflux disease)    Graves disease    History of kidney stones    History of renal cell carcinoma    s/p  left partial nephrectomy   History of small bowel obstruction    recurrent   History of ulcerative colitis    s/p total colectomy 1981   Hypercalcemia 10/28/2012   Hypertension    Hypothyroidism    Ileostomy present (HCC) 10/28/2012   Leukocytosis 10/28/2012   IMO SNOMED Dx Update Oct 2024     Lightheadedness 06/10/2023   Mixed hyperlipidemia    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    OA (osteoarthritis) of knee    both   Obstructive sleep apnea 01/01/2015   Paresthesia 04/14/2019   Personal history of asthma    childhood   Primary localized osteoarthritis of left knee 12/20/2014   Primary localized osteoarthritis of right knee    Right nephrolithiasis    non-obstuctive per ct 03-14-2015   Right ureteral stone    S/P ileostomy (HCC)    Statin intolerance    Stroke (HCC) 03/01/23   very mild stroke   Stroke-like symptoms 03/01/2023   UC (ulcerative colitis) (HCC)    Family History  Problem Relation Age of Onset   Cancer Father        thyroid    Prostate cancer Brother    Allergies[1] Current Outpatient Medications  Medication Sig Dispense Refill Last Dose/Taking   acetaminophen  (TYLENOL ) 500 MG tablet Take 500 mg by mouth daily as needed for moderate pain (pain score 4-6), fever or headache.      albuterol (VENTOLIN HFA) 108 (90 Base) MCG/ACT inhaler Inhale 1-2 puffs  into the lungs every 4 (four) hours as needed for wheezing or shortness of breath.      ALPRAZolam  (XANAX ) 0.25 MG tablet Take 0.25 mg by mouth 2 (two) times daily as needed for anxiety.      anastrozole (ARIMIDEX) 1 MG tablet Take 1 mg by mouth daily.      Ascorbic Acid (VITAMIN C PO) Take 1 tablet by mouth daily.      aspirin  EC 81 MG tablet Take 81 mg by mouth daily. Swallow whole.      b complex vitamins capsule Take 1 capsule by mouth daily.      BENFOTIAMINE PO Take 1 tablet by mouth daily.      Cholecalciferol 50 MCG (2000 UT) TABS Take 2,000 Units by mouth daily.      CLOMID 50 MG tablet Take 25 mg by mouth daily.      Coenzyme Q10 (CO Q-10 PO) Take 1 tablet by mouth daily.      DULoxetine  (CYMBALTA ) 20 MG capsule Take 40 mg by mouth daily.      furosemide (LASIX) 20 MG tablet Take 20 mg by mouth daily as needed for fluid or edema.      Glucosamine-Chondroitin (GLUCOSAMINE CHONDR COMPLEX PO) Take 2 tablets by mouth daily.      levothyroxine  (SYNTHROID ) 100 MCG tablet Take  100 mcg by mouth daily.      lisinopril  (ZESTRIL ) 10 MG tablet Take 5 mg by mouth daily.      Multiple Vitamins-Minerals (MULTIVITAMIN MEN 50+) TABS Take 1 tablet by mouth daily.      Omega-3 Fatty Acids (FISH OIL PO) Take 2 capsules by mouth daily.      omeprazole  (PRILOSEC ) 20 MG capsule Take 1 capsule by mouth daily.      pravastatin  (PRAVACHOL ) 40 MG tablet Take 1 tablet (40 mg total) by mouth every evening. 90 tablet 3    tizanidine (ZANAFLEX) 2 MG capsule Take 2-4 mg by mouth 3 (three) times daily as needed for muscle spasms.      traZODone  (DESYREL ) 150 MG tablet Take 75 mg by mouth at bedtime.      No current facility-administered medications for this encounter.   ROS  Review of Systems  Respiratory: Negative.    Cardiovascular:        Hx CVA BP stable, was elevated at start of visit.  No symptoms, just hurried to get here   Gastrointestinal:        Ulcerative colitis with colectomy  Hx blockage   Musculoskeletal:  Positive for joint swelling.       Left side piriformis syndrome and left hip pain  Skin:  Positive for color change and rash.       Peristomal irritation  Psychiatric/Behavioral:  The patient is nervous/anxious.   All other systems reviewed and are negative.  Vital signs:  BP (!) (P) 142/98 (BP Location: Right Arm)   Pulse (P) 63   Temp (P) 98 F (36.7 C) (Oral)   Resp (P) 18   SpO2 (P) 100%  Exam:  Physical Exam Vitals reviewed.  Constitutional:      Appearance: Normal appearance.  HENT:     Mouth/Throat:     Mouth: Mucous membranes are moist.  Cardiovascular:     Rate and Rhythm: Normal rate.  Pulmonary:     Effort: Pulmonary effort is normal.  Abdominal:     Palpations: Abdomen is soft.  Musculoskeletal:     Comments: Knee pain Left hip pain  Skin:    General: Skin is warm and dry.     Findings: Erythema and rash present.  Neurological:     Mental Status: He is alert and oriented to person, place, and time. Mental status is at baseline.  Psychiatric:        Mood and Affect: Mood normal.        Behavior: Behavior normal.     Stoma type/location:  RLQ ileostomy, budded pink and moist  Stomal assessment/size:  1 1/4  Peristomal assessment:  Erythema on left side.  Has recently had to begin sleeping on right side due to left side piriformis syndrome.    Treatment options for stomal/peristomal skin: He is in convatec pouch, changes twice weekly and not interested in changing pouch.  Irritation is not in the pattern of adhesive or barrier, so not consistent with allergy.   Output: pasty brown stool.  Has been thicker lately with recent medication changes.  Is urinating more, he reports.  Ostomy pouching: 1pc. With barrier ring stoma powder and skin prep.   Education provided:  Due to peristomal skin irritation, recommend adding Flonase nasal spray for topical steroid action without a greasy cream that will impede adhesion. 2 squirts onto fingers and  apply to irritation. Allow to air dry a few seconds then pouch as usual.  Impression/dx  Irritant contact dermatitis Ileostomy  Discussion  Adding Flonase to pouching.  Plan  I personally spent a total of 55 minutes in the care of the patient today including preparing to see the patient, getting/reviewing separately obtained history, performing a medically appropriate exam/evaluation, counseling and educating, placing orders, and documenting clinical information in the EHR.     Visit time: 55 minutes.   Darice Cooley FNP-BC        [1] No Known Allergies

## 2024-03-22 NOTE — Discharge Instructions (Signed)
 Adding Flonase to peristomal skin irritation Air dry then pouch as usual

## 2024-03-24 DIAGNOSIS — E890 Postprocedural hypothyroidism: Secondary | ICD-10-CM | POA: Diagnosis not present

## 2024-03-24 DIAGNOSIS — E8770 Fluid overload, unspecified: Secondary | ICD-10-CM | POA: Diagnosis not present

## 2024-03-24 DIAGNOSIS — R899 Unspecified abnormal finding in specimens from other organs, systems and tissues: Secondary | ICD-10-CM | POA: Diagnosis not present

## 2024-03-28 DIAGNOSIS — G4733 Obstructive sleep apnea (adult) (pediatric): Secondary | ICD-10-CM | POA: Diagnosis not present
